# Patient Record
Sex: Male | Born: 1950
Health system: Southern US, Community
[De-identification: ages and names within clinical notes are randomized; demographics above are authoritative.]

## PROBLEM LIST (undated history)

## (undated) DIAGNOSIS — I499 Cardiac arrhythmia, unspecified: Secondary | ICD-10-CM

## (undated) DIAGNOSIS — I5020 Unspecified systolic (congestive) heart failure: Secondary | ICD-10-CM

## (undated) DIAGNOSIS — I77819 Aortic ectasia, unspecified site: Secondary | ICD-10-CM

## (undated) DIAGNOSIS — Z95818 Presence of other cardiac implants and grafts: Secondary | ICD-10-CM

## (undated) DIAGNOSIS — I4819 Other persistent atrial fibrillation: Secondary | ICD-10-CM

## (undated) DIAGNOSIS — I77810 Thoracic aortic ectasia: Secondary | ICD-10-CM

## (undated) DIAGNOSIS — I5032 Chronic diastolic (congestive) heart failure: Secondary | ICD-10-CM

## (undated) DIAGNOSIS — B1920 Unspecified viral hepatitis C without hepatic coma: Secondary | ICD-10-CM

## (undated) DIAGNOSIS — R911 Solitary pulmonary nodule: Secondary | ICD-10-CM

## (undated) DIAGNOSIS — I428 Other cardiomyopathies: Secondary | ICD-10-CM

## (undated) DIAGNOSIS — I2510 Atherosclerotic heart disease of native coronary artery without angina pectoris: Secondary | ICD-10-CM

## (undated) DIAGNOSIS — I10 Essential (primary) hypertension: Secondary | ICD-10-CM

## (undated) DIAGNOSIS — M1990 Unspecified osteoarthritis, unspecified site: Secondary | ICD-10-CM

## (undated) DIAGNOSIS — I429 Cardiomyopathy, unspecified: Secondary | ICD-10-CM

## (undated) DIAGNOSIS — K219 Gastro-esophageal reflux disease without esophagitis: Secondary | ICD-10-CM

## (undated) HISTORY — DX: Gastro-esophageal reflux disease without esophagitis: K21.9

## (undated) HISTORY — DX: Other cardiomyopathies: I42.8

## (undated) HISTORY — DX: Other persistent atrial fibrillation: I48.19

## (undated) HISTORY — DX: Aortic ectasia, unspecified site: I77.819

## (undated) HISTORY — DX: Essential (primary) hypertension: I10

## (undated) HISTORY — DX: Cardiomyopathy, unspecified: I42.9

## (undated) HISTORY — DX: Atherosclerotic heart disease of native coronary artery without angina pectoris: I25.10

## (undated) HISTORY — PX: LUMBAR LAMINECTOMY: SHX95

## (undated) HISTORY — DX: Unspecified systolic (congestive) heart failure: I50.20

## (undated) HISTORY — PX: LIVER BIOPSY: SHX301

## (undated) HISTORY — DX: Chronic diastolic (congestive) heart failure: I50.32

## (undated) HISTORY — DX: Unspecified viral hepatitis C without hepatic coma: B19.20

## (undated) HISTORY — PX: APPENDECTOMY: SHX54

## (undated) HISTORY — DX: Thoracic aortic ectasia: I77.810

## (undated) HISTORY — DX: Solitary pulmonary nodule: R91.1

## (undated) HISTORY — DX: Presence of other cardiac implants and grafts: Z95.818

## (undated) SURGICAL SUPPLY — 18 items
BLANKET WARM UNDERBOD FULL ACC (MISCELLANEOUS) ×1 IMPLANT
CATH DIAG 6FR PIGTAIL ANGLED (CATHETERS) IMPLANT
CATH WATCHMAN STEER ACCESS SYS (CATHETERS) IMPLANT
CLOSURE PERCLOSE PROSTYLE (VASCULAR PRODUCTS) IMPLANT
DEVICE WATCHMAN FLX PRO PROC (KITS) IMPLANT
DEVICE WATCHMAN TRUSTEER PROC (KITS) IMPLANT
DILATOR VESSEL 38 20CM 12FR (INTRODUCER) IMPLANT
KIT VERSACROSS CON 12FR 85 (KITS) IMPLANT
PACK CARDIAC CATHETERIZATION (CUSTOM PROCEDURE TRAY) ×1 IMPLANT
PAD DEFIB RADIO PHYSIO CONN (PAD) ×1 IMPLANT
SHEATH PERFORMER 18FRX30 (VASCULAR PRODUCTS) IMPLANT
SHEATH PINNACLE 8F 10CM (SHEATH) IMPLANT
SHEATH PROBE COVER 6X72 (BAG) ×1 IMPLANT
SYR CONTROL 10ML ANGIOGRAPHIC (SYRINGE) IMPLANT
TRANSDUCER W/STOPCOCK (MISCELLANEOUS) ×1 IMPLANT
WATCHMAN FLX PRO 31 (Prosthesis & Implant Heart) IMPLANT
WATCHMAN FLX PRO PROCEDURE (KITS) ×1 IMPLANT
WATCHMAN TRUSTEER PROCEDURE (KITS) ×1 IMPLANT

---

## 2000-07-13 ENCOUNTER — Ambulatory Visit (HOSPITAL_COMMUNITY): Admission: RE | Admit: 2000-07-13 | Discharge: 2000-07-13 | Payer: Self-pay | Admitting: Gastroenterology

## 2002-08-30 ENCOUNTER — Encounter: Payer: Self-pay | Admitting: Internal Medicine

## 2003-04-03 ENCOUNTER — Ambulatory Visit (HOSPITAL_COMMUNITY): Admission: RE | Admit: 2003-04-03 | Discharge: 2003-04-03 | Payer: Self-pay | Admitting: *Deleted

## 2004-10-18 ENCOUNTER — Ambulatory Visit: Payer: Self-pay | Admitting: Internal Medicine

## 2004-11-13 ENCOUNTER — Ambulatory Visit: Payer: Self-pay | Admitting: Internal Medicine

## 2005-09-08 ENCOUNTER — Ambulatory Visit: Payer: Self-pay | Admitting: Internal Medicine

## 2005-11-28 ENCOUNTER — Ambulatory Visit: Payer: Self-pay | Admitting: Internal Medicine

## 2006-02-21 ENCOUNTER — Emergency Department: Payer: Self-pay | Admitting: Emergency Medicine

## 2006-02-21 ENCOUNTER — Other Ambulatory Visit: Payer: Self-pay

## 2006-02-22 ENCOUNTER — Ambulatory Visit: Payer: Self-pay | Admitting: Emergency Medicine

## 2006-06-04 ENCOUNTER — Ambulatory Visit: Payer: Self-pay | Admitting: Internal Medicine

## 2006-07-07 ENCOUNTER — Ambulatory Visit: Payer: Self-pay | Admitting: Internal Medicine

## 2008-02-24 ENCOUNTER — Ambulatory Visit: Payer: Self-pay | Admitting: Internal Medicine

## 2008-02-24 DIAGNOSIS — K7460 Unspecified cirrhosis of liver: Secondary | ICD-10-CM | POA: Insufficient documentation

## 2008-02-24 DIAGNOSIS — K219 Gastro-esophageal reflux disease without esophagitis: Secondary | ICD-10-CM | POA: Insufficient documentation

## 2008-02-25 ENCOUNTER — Ambulatory Visit: Payer: Self-pay | Admitting: Internal Medicine

## 2008-02-29 LAB — CONVERTED CEMR LAB
ALT: 57 units/L — ABNORMAL HIGH (ref 0–53)
AST: 53 units/L — ABNORMAL HIGH (ref 0–37)
Albumin: 4 g/dL (ref 3.5–5.2)
Alkaline Phosphatase: 62 units/L (ref 39–117)
BUN: 16 mg/dL (ref 6–23)
Basophils Absolute: 0 10*3/uL (ref 0.0–0.1)
Basophils Relative: 0.2 % (ref 0.0–3.0)
Bilirubin, Direct: 0.2 mg/dL (ref 0.0–0.3)
CO2: 31 meq/L (ref 19–32)
Calcium: 9.4 mg/dL (ref 8.4–10.5)
Chloride: 109 meq/L (ref 96–112)
Creatinine, Ser: 0.8 mg/dL (ref 0.4–1.5)
Eosinophils Absolute: 0.4 10*3/uL (ref 0.0–0.7)
Eosinophils Relative: 4.6 % (ref 0.0–5.0)
GFR calc Af Amer: 128 mL/min
GFR calc non Af Amer: 106 mL/min
Glucose, Bld: 89 mg/dL (ref 70–99)
HCT: 42.7 % (ref 39.0–52.0)
Hemoglobin: 14.6 g/dL (ref 13.0–17.0)
Lymphocytes Relative: 34.5 % (ref 12.0–46.0)
MCHC: 34.3 g/dL (ref 30.0–36.0)
MCV: 96.1 fL (ref 78.0–100.0)
Monocytes Absolute: 1 10*3/uL (ref 0.1–1.0)
Monocytes Relative: 11.2 % (ref 3.0–12.0)
Neutro Abs: 4.2 10*3/uL (ref 1.4–7.7)
Neutrophils Relative %: 49.5 % (ref 43.0–77.0)
PSA: 0.31 ng/mL (ref 0.10–4.00)
Phosphorus: 3.5 mg/dL (ref 2.3–4.6)
Platelets: 209 10*3/uL (ref 150–400)
Potassium: 4.6 meq/L (ref 3.5–5.1)
RBC: 4.44 M/uL (ref 4.22–5.81)
RDW: 13.2 % (ref 11.5–14.6)
Sodium: 143 meq/L (ref 135–145)
Total Bilirubin: 1 mg/dL (ref 0.3–1.2)
Total Protein: 7.5 g/dL (ref 6.0–8.3)
WBC: 8.6 10*3/uL (ref 4.5–10.5)

## 2008-05-12 ENCOUNTER — Emergency Department: Payer: Self-pay

## 2008-05-23 ENCOUNTER — Ambulatory Visit: Payer: Self-pay | Admitting: Family Medicine

## 2008-07-26 ENCOUNTER — Ambulatory Visit: Payer: Self-pay | Admitting: Internal Medicine

## 2009-02-20 ENCOUNTER — Ambulatory Visit: Payer: Self-pay | Admitting: Internal Medicine

## 2009-02-20 DIAGNOSIS — L259 Unspecified contact dermatitis, unspecified cause: Secondary | ICD-10-CM | POA: Insufficient documentation

## 2009-03-08 ENCOUNTER — Ambulatory Visit: Payer: Self-pay | Admitting: Internal Medicine

## 2009-03-08 LAB — CONVERTED CEMR LAB
Cholesterol, target level: 200 mg/dL
HDL goal, serum: 40 mg/dL
LDL Goal: 130 mg/dL

## 2009-03-09 LAB — CONVERTED CEMR LAB
ALT: 50 units/L (ref 0–53)
AST: 54 units/L — ABNORMAL HIGH (ref 0–37)
Albumin: 4.1 g/dL (ref 3.5–5.2)
Alkaline Phosphatase: 64 units/L (ref 39–117)
BUN: 14 mg/dL (ref 6–23)
Basophils Absolute: 0 10*3/uL (ref 0.0–0.1)
Basophils Relative: 0.3 % (ref 0.0–3.0)
Bilirubin, Direct: 0.1 mg/dL (ref 0.0–0.3)
CO2: 31 meq/L (ref 19–32)
Calcium: 9.2 mg/dL (ref 8.4–10.5)
Chloride: 106 meq/L (ref 96–112)
Creatinine, Ser: 0.9 mg/dL (ref 0.4–1.5)
Eosinophils Absolute: 0.4 10*3/uL (ref 0.0–0.7)
Eosinophils Relative: 4.7 % (ref 0.0–5.0)
GFR calc non Af Amer: 91.99 mL/min (ref >60)
Glucose, Bld: 77 mg/dL (ref 70–99)
HCT: 43 % (ref 39.0–52.0)
Hemoglobin: 14.5 g/dL (ref 13.0–17.0)
INR: 1 (ref 0.8–1.0)
Lymphocytes Relative: 29.4 % (ref 12.0–46.0)
Lymphs Abs: 2.8 10*3/uL (ref 0.7–4.0)
MCHC: 33.7 g/dL (ref 30.0–36.0)
MCV: 95.7 fL (ref 78.0–100.0)
Monocytes Absolute: 0.9 10*3/uL (ref 0.1–1.0)
Monocytes Relative: 9.8 % (ref 3.0–12.0)
Neutro Abs: 5.4 10*3/uL (ref 1.4–7.7)
Neutrophils Relative %: 55.8 % (ref 43.0–77.0)
PSA: 0.27 ng/mL (ref 0.10–4.00)
Platelets: 188 10*3/uL (ref 150.0–400.0)
Potassium: 3.9 meq/L (ref 3.5–5.1)
Prothrombin Time: 10.9 s (ref 9.1–11.7)
RBC: 4.5 M/uL (ref 4.22–5.81)
RDW: 13.5 % (ref 11.5–14.6)
Sodium: 142 meq/L (ref 135–145)
TSH: 0.78 microintl units/mL (ref 0.35–5.50)
Total Bilirubin: 0.6 mg/dL (ref 0.3–1.2)
Total Protein: 7.7 g/dL (ref 6.0–8.3)
WBC: 9.5 10*3/uL (ref 4.5–10.5)

## 2009-03-20 ENCOUNTER — Telehealth: Payer: Self-pay | Admitting: Internal Medicine

## 2009-03-28 ENCOUNTER — Encounter: Payer: Self-pay | Admitting: Internal Medicine

## 2009-08-14 ENCOUNTER — Ambulatory Visit: Payer: Self-pay | Admitting: Family Medicine

## 2009-08-15 LAB — CONVERTED CEMR LAB
ALT: 53 units/L (ref 0–53)
AST: 46 units/L — ABNORMAL HIGH (ref 0–37)
Albumin: 4.1 g/dL (ref 3.5–5.2)
Alkaline Phosphatase: 59 units/L (ref 39–117)
BUN: 14 mg/dL (ref 6–23)
Bilirubin, Direct: 0.1 mg/dL (ref 0.0–0.3)
CO2: 30 meq/L (ref 19–32)
Calcium: 9.2 mg/dL (ref 8.4–10.5)
Chloride: 106 meq/L (ref 96–112)
Creatinine, Ser: 0.9 mg/dL (ref 0.4–1.5)
GFR calc non Af Amer: 91.85 mL/min (ref >60)
Glucose, Bld: 129 mg/dL — ABNORMAL HIGH (ref 70–99)
Potassium: 4.3 meq/L (ref 3.5–5.1)
Sodium: 140 meq/L (ref 135–145)
Total Bilirubin: 0.4 mg/dL (ref 0.3–1.2)
Total Protein: 7.8 g/dL (ref 6.0–8.3)

## 2009-08-27 ENCOUNTER — Telehealth: Payer: Self-pay | Admitting: Family Medicine

## 2009-09-03 ENCOUNTER — Ambulatory Visit: Payer: Self-pay | Admitting: Internal Medicine

## 2009-09-03 DIAGNOSIS — I10 Essential (primary) hypertension: Secondary | ICD-10-CM | POA: Insufficient documentation

## 2009-09-07 ENCOUNTER — Telehealth: Payer: Self-pay | Admitting: Internal Medicine

## 2009-09-10 ENCOUNTER — Ambulatory Visit: Payer: Self-pay | Admitting: Internal Medicine

## 2009-11-20 ENCOUNTER — Encounter: Admission: RE | Admit: 2009-11-20 | Discharge: 2009-11-20 | Payer: Self-pay | Admitting: Internal Medicine

## 2009-11-20 ENCOUNTER — Telehealth: Payer: Self-pay | Admitting: Internal Medicine

## 2010-03-18 ENCOUNTER — Telehealth: Payer: Self-pay | Admitting: Internal Medicine

## 2010-05-08 ENCOUNTER — Encounter: Payer: Self-pay | Admitting: Internal Medicine

## 2010-05-08 ENCOUNTER — Ambulatory Visit: Payer: Self-pay | Admitting: Internal Medicine

## 2010-05-13 ENCOUNTER — Ambulatory Visit: Payer: Self-pay | Admitting: Internal Medicine

## 2010-05-13 ENCOUNTER — Telehealth (INDEPENDENT_AMBULATORY_CARE_PROVIDER_SITE_OTHER): Payer: Self-pay | Admitting: *Deleted

## 2010-05-13 ENCOUNTER — Encounter (INDEPENDENT_AMBULATORY_CARE_PROVIDER_SITE_OTHER): Payer: Self-pay | Admitting: *Deleted

## 2010-05-13 LAB — CONVERTED CEMR LAB
ALT: 44 units/L (ref 0–53)
AST: 42 units/L — ABNORMAL HIGH (ref 0–37)
Albumin: 4.3 g/dL (ref 3.5–5.2)
Alkaline Phosphatase: 59 units/L (ref 39–117)
BUN: 21 mg/dL (ref 6–23)
Basophils Absolute: 0.1 10*3/uL (ref 0.0–0.1)
Basophils Relative: 0.5 % (ref 0.0–3.0)
Bilirubin, Direct: 0.1 mg/dL (ref 0.0–0.3)
CO2: 29 meq/L (ref 19–32)
Calcium: 9.6 mg/dL (ref 8.4–10.5)
Chloride: 104 meq/L (ref 96–112)
Creatinine, Ser: 1 mg/dL (ref 0.4–1.5)
Eosinophils Absolute: 0.3 10*3/uL (ref 0.0–0.7)
Eosinophils Relative: 2.5 % (ref 0.0–5.0)
GFR calc non Af Amer: 85.04 mL/min (ref >60)
Glucose, Bld: 93 mg/dL (ref 70–99)
HCT: 43.8 % (ref 39.0–52.0)
Hemoglobin: 14.7 g/dL (ref 13.0–17.0)
Lymphocytes Relative: 29.1 % (ref 12.0–46.0)
Lymphs Abs: 3.3 10*3/uL (ref 0.7–4.0)
MCHC: 33.5 g/dL (ref 30.0–36.0)
MCV: 97.8 fL (ref 78.0–100.0)
Monocytes Absolute: 1 10*3/uL (ref 0.1–1.0)
Monocytes Relative: 8.5 % (ref 3.0–12.0)
Neutro Abs: 6.8 10*3/uL (ref 1.4–7.7)
Neutrophils Relative %: 59.4 % (ref 43.0–77.0)
PSA: 0.27 ng/mL (ref 0.10–4.00)
Phosphorus: 3.4 mg/dL (ref 2.3–4.6)
Platelets: 217 10*3/uL (ref 150.0–400.0)
Potassium: 5.1 meq/L (ref 3.5–5.1)
RBC: 4.48 M/uL (ref 4.22–5.81)
RDW: 14.1 % (ref 11.5–14.6)
Sodium: 141 meq/L (ref 135–145)
TSH: 0.71 microintl units/mL (ref 0.35–5.50)
Total Bilirubin: 0.6 mg/dL (ref 0.3–1.2)
Total Protein: 7.4 g/dL (ref 6.0–8.3)
WBC: 11.4 10*3/uL — ABNORMAL HIGH (ref 4.5–10.5)

## 2010-05-21 LAB — CONVERTED CEMR LAB
HCV Ab: REACTIVE — AB
HCV Quantitative: 5120000 intl units/mL — ABNORMAL HIGH (ref <43)

## 2010-05-27 ENCOUNTER — Encounter: Payer: Self-pay | Admitting: Internal Medicine

## 2010-07-25 NOTE — Assessment & Plan Note (Signed)
Summary: BLOOD PRESSURE/DS   Vital Signs:  Patient profile:   60 year old male Weight:      202 pounds Temp:     98.8 degrees F oral Pulse rate:   60 / minute Pulse rhythm:   regular BP sitting:   156 / 70  (left arm) Cuff size:   large  Vitals Entered By: Mervin Hack CMA Duncan Dull) (September 03, 2009 10:01 AM)  Serial Vital Signs/Assessments:  Time      Position  BP       Pulse  Resp  Temp     By           R Arm     146/84                         [name] MD  CC: blood pressure   History of Present Illness: BP has remained high Did note some tingling in left arm several days ago--lasted about a day starting at night and then easing through the next day  Slight neck stiffness and some slight head discomfort  Seen by Dr Dayton Martes  ~2 weeks ago--then went to urgent care after that for possible "flu" Blood count was high and put on augmentin. Did have 2 nights of drenching night sweats  Home BP taking morning and evening 160/84, 134/80, 144/84, 150/76, 156/80 Had been over 160 and occ 170 systolic before doubling lisinopril  No throat symptoms except rare funny feeling Quit smoking and using nicotine gum No sig cough   Allergies: No Known Drug Allergies  Past History:  Past medical, surgical, family and social histories (including risk factors) reviewed for relevance to current acute and chronic problems.  Past Medical History: Hepatitis C--Rx interferon/ribaviron 2004-'05 (recurred after Rx) GERD Hypertension  Past Surgical History: Reviewed history from 02/24/2008 and no changes required. Appendectomy Disk repair  ~1990  Family History: Reviewed history from 03/08/2009 and no changes required. Dad committed suicide Mom died of DM complications 1 brother 1 half brother, 3 half sisters Half sister with breast cancer ?CAD in distant maternal relatives No HTN No colon cancer or prostate cancer  Social History: Reviewed history from 08/14/2009 and no  changes required. Occupation: Games developer, has farm Married--1 child, 2 step children Quit smoking December 2010 Alcohol use-occ beer  Review of Systems  The patient denies chest pain, syncope, and dyspnea on exertion.         had slight dizziness 1 day still with some rib soreness Occ indigestion  Physical Exam  General:  alert and normal appearance.   Neck:  supple, no masses, no thyromegaly, no carotid bruits, and no cervical lymphadenopathy.   Lungs:  normal respiratory effort and normal breath sounds.   Heart:  normal rate, regular rhythm, no murmur, and no gallop.   Extremities:  no edema Psych:  normally interactive, good eye contact, not anxious appearing, and not depressed appearing.     Impression & Recommendations:  Problem # 1:  HYPERTENSION (ICD-401.9) Assessment Improved discussed choices Much better but will add HCTZ recheck with labs in 4-6 weeks  The following medications were removed from the medication list:    Lisinopril 10 Mg Tabs (Lisinopril) .Marland Kitchen... Take 2 tab by mouth daily His updated medication list for this problem includes:    Lisinopril-hydrochlorothiazide 20-12.5 Mg Tabs (Lisinopril-hydrochlorothiazide) .Marland Kitchen... 1 tab daily for high blood pressure  BP today: 156/70 Prior BP: 188/90 (08/14/2009)  Prior 10 Yr Risk Heart  Disease: Not enough information (03/08/2009)  Labs Reviewed: K+: 4.3 (08/14/2009) Creat: : 0.9 (08/14/2009)     Problem # 2:  HEPATITIS C (ICD-070.51) Assessment: Comment Only has been considering repeat biopsy not clear cut reassuring that LFTs are near normal  Complete Medication List: 1)  Omeprazole 20 Mg Cpdr (Omeprazole) .... Take 1 capsule by mouth once a day as needed 2)  Ibuprofen 200 Mg Caps (Ibuprofen) .... Take one by mouth as needed 3)  Lisinopril-hydrochlorothiazide 20-12.5 Mg Tabs (Lisinopril-hydrochlorothiazide) .Marland Kitchen.. 1 tab daily for high blood pressure  Patient Instructions: 1)  Please schedule  a follow-up appointment in 4-6 weeks.  Prescriptions: LISINOPRIL-HYDROCHLOROTHIAZIDE 20-12.5 MG TABS (LISINOPRIL-HYDROCHLOROTHIAZIDE) 1 tab daily for high blood pressure  #90 x 3   Entered and Authorized by:   [name] MD   Signed by:   [name] MD on 09/03/2009   Method used:   Electronically to        [facility] (retail)       [address]       [place], [place]  [id number]       Ph: [phone]       Fax: [phone]   RxID:   [id number]   Current Allergies (reviewed today): No known allergies

## 2010-07-25 NOTE — Letter (Signed)
Summary: Notification of [facility]  Notification of [facility]   Imported By: Maryln Gottron 06/03/2010 13:16:51  _____________________________________________________________________  External Attachment:    Type:   Image     Comment:   External Document  Appended Document: Notification of [facility] has appt 07/24/10 with Dr Foy [place] for hepatitis c

## 2010-07-25 NOTE — Progress Notes (Signed)
Summary: Blood pressure  Phone Note Call from Patient Call back at Home Phone [phone]   Caller: Spouse Call For: [name] MD Summary of Call: Blood pressure readings since last week: 173/88, 179/93, 127/66, 160/85, this morning 166/99.  Patient is on Lisinopril.  Patient wants to know if he should make appt with [name] to follow up about blood pressure.   Initial call taken by: Linde Gillis CMA Duncan Dull),  August 27, 2009 9:30 AM  Follow-up for Phone Call        go ahead and increase his lisinopril to 20 (that is 2 of the 10 mg) once daily  sched f/u with Dr [name] please update [place] if new symptoms or worse bp  Follow-up by: Judith Part MD,  August 27, 2009 9:35 AM  Additional Follow-up for Phone Call Additional follow up Details #1::        spoke with pt and advised results, pt is also taking Augmentin 875 two times a day, would that have an effect on his BP? I scheduled pt with [name] on 09/03/2009 @ 10:00am. Please advise DeShannon Katrinka Blazing CMA (AAMA)  August 27, 2009 1:48 PM   I do not think that would affect bp but otc meds may- so keep me aware ---MT  Pt states he is not taking any otc meds.  Lowella Petties CMA  August 27, 2009 3:35 PM   Additional Follow-up by: Judith Part MD,  August 27, 2009 2:35 PM

## 2010-07-25 NOTE — Assessment & Plan Note (Signed)
Summary: check pulse, BP/ds   Vital Signs:  Patient profile:   60 year old male Weight:      199 pounds Temp:     98.8 degrees F oral Pulse rate:   92 / minute Pulse rhythm:   regular BP sitting:   112 / 68  (left arm) Cuff size:   large  Vitals Entered By: Mervin Hack CMA Duncan Dull) (September 10, 2009 4:38 PM)  Serial Vital Signs/Assessments:  Time      Position  BP       Pulse  Resp  Temp     By           R Arm     124/76                         [name] MD  CC: discuss blood pressure and pulse   History of Present Illness: See phone note Had elevated heart rate but no symptoms  No chest pain No SOB No dizziness No syncope Mild decrease in exercise tolerance---slowly over years 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  Past Medical History: Reviewed history from 09/03/2009 and no changes required. Hepatitis C--Rx interferon/ribaviron 2004-'05 (recurred after Rx) GERD Hypertension  Past Surgical History: Reviewed history from 02/24/2008 and no changes required. Appendectomy Disk repair  ~1990  Family History: Reviewed history from 03/08/2009 and no changes required. Dad committed suicide Mom died of DM complications 1 brother 1 half brother, 3 half sisters Half sister with breast cancer ?CAD in distant maternal relatives No HTN No colon cancer or prostate cancer  Social History: Reviewed history from 08/14/2009 and no changes required. 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   Neck:  supple, no masses, no thyromegaly, no carotid bruits, and no cervical lymphadenopathy.   Chest Wall:  no deformities and no tenderness.   Lungs:  normal respiratory effort and normal breath sounds.   Heart:  normal rate, regular rhythm, no murmur, no gallop, and no rub.   Extremities:  no edema Psych:  normally interactive, good eye contact, not anxious appearing, and not depressed appearing.     Impression & Recommendations:  Problem # 1:  TACHYCARDIA (ICD-785.0) Assessment New  sounds appropriate to the situation discussed concerning symptoms--to call 911---but nothing he states is concerning now mild change in exercise tolerance but nothing striking  no changes now if he has symptoms though, could consider changing to beta blocker will just reevaluate at his follow up  Orders: EKG w/ Interpretation (93000)  Complete Medication List: 1)  Omeprazole 20 Mg Cpdr (Omeprazole) .... Take 1 capsule by mouth once a day as needed 2)  Ibuprofen 200 Mg Caps (Ibuprofen) .... Take one  by mouth as needed 3)  Lisinopril-hydrochlorothiazide 20-12.5 Mg Tabs (Lisinopril-hydrochlorothiazide) .Marland Kitchen.. 1 tab daily for high blood pressure  Patient Instructions: 1)  Cancel April visit 2)  Please schedule a follow-up appointment in 4 months .   Current Allergies (reviewed today): No known allergies    EKG  Procedure date:  09/10/2009  Findings:      sinus arrhythmia @75  Normal

## 2010-07-25 NOTE — Assessment & Plan Note (Signed)
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  Past Medical History: Reviewed history from 09/03/2009 and no changes required. Hepatitis C--Rx interferon/ribaviron 2004-'05 (recurred after Rx) GERD Hypertension  Past Surgical History: Reviewed history from 02/24/2008 and no changes required. Appendectomy Disk repair  ~1990  Family History: Reviewed history from 03/08/2009 and no changes required. Dad committed suicide Mom died of DM complications 1 brother 1 half brother, 3 half sisters Half sister with breast cancer ?CAD in distant maternal relatives No HTN No colon cancer or prostate cancer  Social History: Occupation: Games developer, has farm Married--1 child, 2 step children Quit smoking December 2010. Restarted. Alcohol use-occ beer Packs/Day:  0.5  Review of Systems General:  Denies sleep  disorder; weight is stable wears seat belt. Eyes:  Denies double vision and vision loss-1 eye. ENT:  Complains of ringing in ears; denies decreased hearing; rare brief tinnitus Teeth okay--sees dentist. CV:  Denies chest pain or discomfort, difficulty breathing at night, difficulty breathing while lying down, fainting, near fainting, palpitations, and shortness of breath with exertion; has had some rare skip beats Stil feels something in his right ribs. Resp:  Denies cough and shortness of breath. GI:  Denies abdominal pain, bloody stools, change in bowel habits, dark tarry stools, indigestion, nausea, and vomiting; stomach quiet on omeprazole. GU:  Denies erectile dysfunction, urinary frequency, and urinary hesitancy. MS:  Complains of low back pain; denies joint pain and joint swelling; occ back pain--relates to past surgery. Derm:  Complains of lesion(s); denies rash; has a couple of moles on legs and spot on left arm. Neuro:  Denies headaches, numbness, tingling, and weakness. Psych:  Denies anxiety and depression. Heme:  Denies abnormal bruising and enlarge lymph nodes. Allergy:  Denies seasonal allergies and sneezing.  Physical Exam  General:  alert and normal appearance.   Eyes:  pupils equal, pupils round, pupils reactive to light, and no optic disk abnormalities.   Ears:  R ear normal and L ear normal.   Mouth:  no erythema, no exudates, and no lesions.   Neck:  supple, no masses, no thyromegaly, no carotid bruits, and no cervical lymphadenopathy.   Lungs:  normal respiratory effort, no intercostal retractions, no accessory muscle use, and normal breath sounds.   Heart:  normal rate, regular  rhythm, no murmur, and no gallop.   Abdomen:  soft, non-tender, no masses, no hepatomegaly, and no splenomegaly.   Rectal:  no hemorrhoids and no masses.   Prostate:  no nodules.  MIld asymmetry with right slightly larger Msk:  no joint tenderness and no joint swelling.   Pulses:  1+ in  feet Extremities:  no edeema Neurologic:  alert & oriented X3, strength normal in all extremities, and gait normal.   Skin:  no rashes and no ulcerations.   5mm nevus on left thigh, also seb keratosis Axillary Nodes:  No palpable lymphadenopathy Psych:  normally interactive, good eye contact, not anxious appearing, and not depressed appearing.     Impression & Recommendations:  Problem # 1:  PHYSICAL EXAMINATION (ICD-V70.0) Assessment Comment Only due for PSA discussed cigarette cessation work on fitness  Problem # 2:  HYPERTENSION (ICD-401.9) Assessment: Unchanged still seems to need low dose Rx  His updated medication list for this problem includes:    Lisinopril-hydrochlorothiazide 20-12.5 Mg Tabs (Lisinopril-hydrochlorothiazide) .Marland Kitchen... 1/2  tab daily for high blood pressure  BP today: 140/80 Prior BP: 112/68 (09/10/2009)  Prior 10 Yr Risk Heart Disease: Not enough information (03/08/2009)  Labs Reviewed: K+: 4.3 (08/14/2009) Creat: : 0.9 (08/14/2009)     Problem # 3:  PALPITATIONS (ICD-785.1) Assessment: New  sounds benign EKG reassuring  Orders: EKG w/ Interpretation (93000) TLB-Renal Function Panel (80069-RENAL) TLB-CBC Platelet - w/Differential (85025-CBCD) TLB-Hepatic/Liver Function Pnl (80076-HEPATIC) TLB-TSH (Thyroid Stimulating Hormone) (84443-TSH) Venipuncture (04540)  Problem # 4:  HEPATITIS C (ICD-070.51) Assessment: Comment Only  will set up reeval at [facility]  Orders: Hepatitis C Clinic Referral (HepC)  Complete Medication List: 1)  Omeprazole 20 Mg Cpdr (Omeprazole) .... Take 1 capsule by mouth once a day as needed 2)  Ibuprofen 200 Mg Caps (Ibuprofen) .... Take one by mouth as needed 3)  Lisinopril-hydrochlorothiazide 20-12.5 Mg Tabs (Lisinopril-hydrochlorothiazide) .... 1/2  tab daily for high blood pressure  Other Orders: TLB-PSA (Prostate Specific Antigen) (84153-PSA)  Patient Instructions: 1)  Please schedule a follow-up appointment in 6  months .  2)  Referral Appointment Information 3)  Day/Date: 4)  Time: 5)  Place/MD: 6)  Address: 7)  Phone/Fax: 8)  Patient given appointment information. Information/Orders faxed/mailed.   Orders Added: 1)  Hepatitis C Clinic Referral [HepC] 2)  EKG w/ Interpretation [93000] 3)  Est. Patient 40-64 years [99396] 4)  TLB-Renal Function Panel [80069-RENAL] 5)  TLB-CBC Platelet - w/Differential [85025-CBCD] 6)  TLB-Hepatic/Liver Function Pnl [80076-HEPATIC] 7)  TLB-TSH (Thyroid Stimulating Hormone) [84443-TSH] 8)  Venipuncture [98119] 9)  TLB-PSA (Prostate Specific Antigen) [14782-NFA]    Current Allergies (reviewed today): No known allergies   Appended Document: CPX    Clinical Lists Changes  Orders: Added new Service order of Admin 1st Vaccine (21308) - Signed Added new Service order of Flu Vaccine 83yrs + [phone]) - Signed Observations: Added new observation of FLU VAX VIS: 01/15/10 version (05/08/2010 13:20) Added new observation of FLU VAXLOT: [id number] (05/08/2010 13:20) Added new observation of FLU VAXMFR: [employer]os[employer]line (05/08/2010 13:20) Added new observation of FLU VAX EXP: 12/21/2010 (05/08/2010 13:20) Added new observation of FLU VAX DSE: 0.74ml (05/08/2010 13:20) Added new observation of FLU VAX: Fluvax 3+ (05/08/2010 13:20)  Flu Vaccine Consent Questions     Do you have a history of severe allergic reactions to this vaccine? no    Any prior history of allergic reactions to egg and/or gelatin? no    Do you have a sensitivity to the preservative Thimersol?  no    Do you have a past history of Guillan-Barre Syndrome? no    Do you currently have an acute febrile illness? no    Have you ever had a severe reaction to latex? no    Vaccine information given and explained to patient? yes    Are you currently pregnant? no    Lot Number:[id number]   Exp Date:12/21/2010   Site Given  Left Deltoid IM    .[email or web address]

## 2010-07-25 NOTE — Miscellaneous (Signed)
  Clinical Lists Changes  Orders: Added new Test order of T-Hepatitis C Antibody ([id number]) - Signed

## 2010-07-25 NOTE — Assessment & Plan Note (Signed)
Summary: high blood pressure, headaches/ alc   Vital Signs:  Patient profile:   60 year old male Height:      72.25 inches Weight:      210 pounds BMI:     28.39 Temp:     98.7 degrees F oral Pulse rate:   68 / minute Pulse rhythm:   regular BP sitting:   188 / 90  (left arm) Cuff size:   regular  Vitals Entered By: Delilah Shan CMA Duncan Dull) (August 14, 2009 10:38 AM) CC: High BP,  H/A   History of Present Illness: 60 yo here for high blood pressure. Does not have a diagnosis of HTN but appears BP has been slowly increasing over this past year. At prior office visits: 02/2009- 150/80 07/2008- 138/80 02/2008- 140/76  Has been getting headaches lately, was at [employer] last week and happened to check BP was 180/90. Wife has BP cuff at home, has been checking daily - ranging 164/85-171/84. No chest pain, SOB, blurred vision, or lower extremity edema.  Did quit smoking a couple of months ago, chew nicotene gum.    Has h/o Hep C, s/p interferon treatment. AST was mildly elevated in 02/2009, otherwise normal. He is concerned that he is having right lower rib pain.  He does repetitve movements at work using mainly that side so thinks it's muscular but wants to be sure it's not his liver. No changes in his bowel movements, color skin, eyes, nausea or vomiting.  Current Medications (verified): 1)  Omeprazole 20 Mg Cpdr (Omeprazole) .... Take 1 Capsule By Mouth Once A Day As Needed 2)  Ibuprofen 200 Mg Caps (Ibuprofen) .... Take One By Mouth As Needed 3)  Lisinopril 10 Mg  Tabs (Lisinopril) .... Take 1 Tab By Mouth Daily  Allergies (verified): No Known Drug Allergies  Social History: Occupation: Games developer, has farm Married--1 child, 2 step children Quit smoking December 2010 Alcohol use-occ beer  Review of Systems      See HPI General:  Denies malaise. Eyes:  Denies blurring. ENT:  Denies difficulty swallowing and sinus pressure. CV:  Denies difficulty  breathing at night, palpitations, shortness of breath with exertion, swelling of feet, and swelling of hands. Resp:  Denies sputum productive. GI:  Denies abdominal pain, bloody stools, change in bowel habits, dark tarry stools, nausea, vomiting, and yellowish skin color. Neuro:  Complains of headaches; denies falling down, sensation of room spinning, tingling, tremors, visual disturbances, and weakness. Heme:  Denies bleeding, fevers, pallor, and skin discoloration.  Physical Exam  General:  alert and normal appearance.   Eyes:  pupils equal, pupils round, pupils reactive to light, and no optic disk abnormalities.   Mouth:  no erythema and no lesions.   Neck:  supple, no masses, no thyromegaly, no carotid bruits, and no cervical lymphadenopathy.   Lungs:  normal respiratory effort and normal breath sounds.   Heart:  normal rate, regular rhythm, no murmur, and no gallop.   Abdomen:  soft, non-tender, and no masses.  no hepatomegaly.   Msk:  No tenderness over rib cage Extremities:  no edema Psych:  normally interactive, good eye contact, not anxious appearing, and not depressed appearing.     Impression & Recommendations:  Problem # 1:  ESSENTIAL HYPERTENSION, BENIGN (ICD-401.1) Assessment New Will start on Linsinopril.  Patient information handout given and discussed concerning possible side effects, including cough. Will check BMET today.  Cr normal (0.9) in Septmeber.  Advised to keep BP log and  call me in 2 weeks, follow up in office in one month. His updated medication list for this problem includes:    Lisinopril 10 Mg Tabs (Lisinopril) .Marland Kitchen... Take 1 tab by mouth daily  Orders: Venipuncture (16109) TLB-BMP (Basic Metabolic Panel-BMET) (80048-METABOL)  Problem # 2:  HEPATITIS C (ICD-070.51) Assessment: Unchanged Will recheck LFTs due to patient concern.  Unable to reproduce pain he is concerned about.  If LFTs stable, may be worth while getting imaging. Orders: Venipuncture  (60454) TLB-Hepatic/Liver Function Pnl (80076-HEPATIC)  Complete Medication List: 1)  Omeprazole 20 Mg Cpdr (Omeprazole) .... Take 1 capsule by mouth once a day as needed 2)  Ibuprofen 200 Mg Caps (Ibuprofen) .... Take one by mouth as needed 3)  Lisinopril 10 Mg Tabs (Lisinopril) .... Take 1 tab by mouth daily  Patient Instructions: 1)  Nice to meet you, Mr. Bingman. 2)  Please call me in 2 weeks with a report on how your blood pressure has been doing. 3)  Make an appointment with [name] or myself in one month to follow up. Prescriptions: LISINOPRIL 10 MG  TABS (LISINOPRIL) Take 1 tab by mouth daily  #90 x 0   Entered and Authorized by:   Ruthe Mannan MD   Signed by:   Ruthe Mannan MD on 08/14/2009   Method used:   Electronically to        [facility] (retail)       [address]       [place], [place]  [id number]       Ph: [phone]       Fax: [phone]   RxID:   [id number]   Current Allergies (reviewed today): No known allergies

## 2010-07-25 NOTE — Progress Notes (Signed)
Summary: wants to stop lisinopril  Phone Note Call from Patient Call back at [phone]   Caller: Patient Call For: [name] MD Action Taken: Patient advised to call 911 Summary of Call: Pt states his BP has been running good and he is asking if he can stop his lisinopril  and keep an eye on his BP.  He would prefer not to have to take anything, but if he has to he will. Initial call taken by: Lowella Petties CMA,  March 18, 2010 10:50 AM  Follow-up for Phone Call        Please have him cut the tabs in half for several weeks and keep an eye on his BP. If it remains okay, he can try off the med  Have him set up appt for physical in the next 2-3 months or so since he is due and then I can recheck his BP then Follow-up by: [name] MD,  March 18, 2010 1:50 PM  Additional Follow-up for Phone Call Additional follow up Details #1::        Advised pt, physical appt made. Additional Follow-up by: Lowella Petties CMA,  March 18, 2010 2:47 PM

## 2010-07-25 NOTE — Progress Notes (Signed)
----   Converted from flag ---- ---- 05/13/2010 1:27 PM, [name] MD wrote: go ahead and order both----it should be the postive/neg first and only do the viral load if it is positive  ---- 05/13/2010 12:47 PM, Liane Comber CMA (AAMA) wrote: Pt came for redraw today, I want to make sure I his order test correctly. Shirlee Limerick says the office he is being referred to will only accept a  result of positive or neg on lab report. Hep C viral load will give a numerical result. Are you ordering his labs just for that office or did you want to know the actual viral load count? If you don't need the viral load count I can order a hep c test that just gives result of pos or neg. If you do want viral load count I can order both. Let me know which you would prefer  Thanks Tasha ------------------------------

## 2010-07-25 NOTE — Miscellaneous (Signed)
Summary: Orders Update  Clinical Lists Changes  Orders: Added new Test order of T-Hepatitis C Viral Load ([id number]) - Signed Added new Service order of Specimen Handling ([id number]) - Signed

## 2010-07-25 NOTE — Progress Notes (Signed)
Summary: pulse of 121  Phone Note Call from Patient Call back at Home Phone [phone]   Caller: Patient Call For: [name] MD Summary of Call: Patient says that when he check his blood pressure today it was 115/75, but his pulse was 121. He says that he has been working out in the yard and being very active today. He says that he feels fine, he wants to know if he needs to come in and be seen. Please advise.  Initial call taken by: Melody Comas,  September 07, 2009 1:56 PM  Follow-up for Phone Call        no, if he had been working out we would expect his heart rate to go up It should at least come down below 100 with rest. If that doesn't occur and stays up, evaluation is appropriate but I am not sure it needs to be immediate unless he has symptoms like chest pain or SOB [name] MD  September 07, 2009 2:02 PM   spoke with pt and now his heart rate is 102. Pt states he has a friend that see's a cardiologist and he explained his situation to the "cardiologist" and he suggested a EKG. Pt would like to come in for a EKG to see if that will explain his blood pressures? Pt states he has heart problems in his family. I explained to pt his BP could be related to several things, but he wants a EKG or stress test.Please advise. DeShannon Katrinka Blazing CMA [facility])  September 07, 2009 2:40 PM   Please set up an appt for Monday or Tuesday His heart rate and blood pressure were okay at last visit can check EKG and consider evaluation by a cardiologist Follow-up by: [name] MD,  September 07, 2009 2:57 PM  Additional Follow-up for Phone Call Additional follow up Details #1::        pt scheduled appt for Monday at 4:15 Additional Follow-up by: Mervin Hack CMA [facility]),  September 07, 2009 4:14 PM

## 2010-07-25 NOTE — Progress Notes (Signed)
Summary: discomfort around rib area  Phone Note Call from Patient Call back at Home Phone [phone]   Caller: Patient Call For: [name] MD Summary of Call: Pt was seen in march for blood pressure issues but had some pain around right ribs.  He says he was told to call if this continued and you would send him for x-rays.  He still has the discomfort.  He prefers to go somewhere in [place] or [place]. Initial call taken by: Lowella Petties CMA,  Nov 20, 2009 8:30 AM  Follow-up for Phone Call        x-rays ordered Follow-up by: [name] MD,  Nov 20, 2009 8:41 AM  New Problems: RIB PAIN, RIGHT SIDED (ICD-786.50)   New Problems: RIB PAIN, RIGHT SIDED (ICD-786.50)

## 2010-09-22 HISTORY — PX: COLONOSCOPY: SHX174

## 2010-10-04 ENCOUNTER — Other Ambulatory Visit: Payer: Self-pay | Admitting: *Deleted

## 2010-10-04 MED ORDER — OMEPRAZOLE 20 MG PO CPDR: 20.0000 mg | DELAYED_RELEASE_CAPSULE | Freq: Every day | ORAL | Status: DC

## 2010-10-10 ENCOUNTER — Telehealth: Payer: Self-pay | Admitting: *Deleted

## 2010-10-10 DIAGNOSIS — Z1211 Encounter for screening for malignant neoplasm of colon: Secondary | ICD-10-CM

## 2010-10-10 NOTE — Telephone Encounter (Signed)
Pt wants a referral for a colonoscopy, he wants to have this done in [place].  He has been seen Dr. Foy Guadal[place]ara, in [place] [facility] the [facility], who has told him he needs to have this done. That doctor wants pt to have the procedure in [place], but that's inconvenient for the pt.  Pt would like to be referred to Dr. Mechele Collin, or someone in that group.  If possible he would like to have this before 5/1, because that is when his insurance deductible starts over.

## 2010-10-14 ENCOUNTER — Ambulatory Visit (AMBULATORY_SURGERY_CENTER): Payer: 59 | Admitting: *Deleted

## 2010-10-14 VITALS — Ht 74.0 in | Wt 199.5 lb

## 2010-10-14 DIAGNOSIS — Z1211 Encounter for screening for malignant neoplasm of colon: Secondary | ICD-10-CM

## 2010-10-14 MED ORDER — PEG-KCL-NACL-NASULF-NA ASC-C 100 G PO SOLR: ORAL | Status: DC

## 2010-10-15 ENCOUNTER — Encounter: Payer: Self-pay | Admitting: Internal Medicine

## 2010-10-18 ENCOUNTER — Ambulatory Visit (AMBULATORY_SURGERY_CENTER): Payer: 59 | Admitting: Internal Medicine

## 2010-10-18 ENCOUNTER — Encounter: Payer: Self-pay | Admitting: Internal Medicine

## 2010-10-18 VITALS — BP 137/76 | HR 59 | Temp 97.9°F | Resp 18 | Ht 74.0 in | Wt 195.0 lb

## 2010-10-18 DIAGNOSIS — Z1211 Encounter for screening for malignant neoplasm of colon: Secondary | ICD-10-CM

## 2010-10-18 DIAGNOSIS — D126 Benign neoplasm of colon, unspecified: Secondary | ICD-10-CM

## 2010-10-18 DIAGNOSIS — K5730 Diverticulosis of large intestine without perforation or abscess without bleeding: Secondary | ICD-10-CM

## 2010-10-18 DIAGNOSIS — K635 Polyp of colon: Secondary | ICD-10-CM

## 2010-10-18 MED ORDER — SODIUM CHLORIDE 0.9 % IV SOLN: 500.0000 mL | INTRAVENOUS | Status: DC

## 2010-10-18 NOTE — Patient Instructions (Signed)
One polyp removed today from your colon.  Severe diverticulosis also seen.  Informationall handouts given to care partner on polyps, diverticulosis and high fiber diet.  You will receive a letter in 2 - 3 weeks to give pathology results and further colonoscopy recommendations.  Please restart current medications today.  Call with any questions or concerns.

## 2010-10-21 ENCOUNTER — Telehealth: Payer: Self-pay | Admitting: *Deleted

## 2010-10-21 NOTE — Telephone Encounter (Signed)
Follow up Call- Patient questions:  Do you have a fever, pain , or abdominal swelling? no Pain Score  0 *  Have you tolerated food without any problems? yes  Have you been able to return to your normal activities? yes  Do you have any questions about your discharge instructions: Diet   no Medications  no Follow up visit  no  Do you have questions or concerns about your Care? no  Actions: * If pain score is 4 or above: No action needed, pain <4.  Spoke with patient's spouse.

## 2010-11-08 ENCOUNTER — Telehealth: Payer: Self-pay | Admitting: Internal Medicine

## 2010-11-08 NOTE — Procedures (Signed)
[place]. [facility]  Patient:    Craig Calhoun, Craig Calhoun                     MRN: [id number] Proc. Date: 07/13/00 Adm. Date:  [id number] Attending:  Judeth Cornfield                           Procedure Report  PROCEDURE:  Percutaneous liver biopsy.  GASTROENTEROLOGIST:  Barbette Hair. Arlyce Dice, M.D. [facility]  HISTORY:  Mr. Mantione has hepatitis C.  This test is performed for further evaluation.  INFORMED CONSENT:  The patient provided consent after risks, benefits, and alternatives were explained.  MEDICATIONS:  Versed 2 mg, fentanyl 25 mcg IV.  DESCRIPTION OF PROCEDURE:  The patient was placed in the supine position.  The liver was percussed.  The skin was prepped with Betadine, and the skin was injected with 1% lidocaine.  The percutaneous biopsy was made via one pass utilizing the 14-gauge tissue biopsy needle.  A 1 cm liver core was obtained.  The patient tolerated the procedure well. DD:  07/13/00 TD:  07/13/00 Job: [id number]

## 2010-11-08 NOTE — Telephone Encounter (Signed)
Forwarded to Dr. Perry for review °

## 2010-11-12 ENCOUNTER — Encounter: Payer: Self-pay | Admitting: Internal Medicine

## 2010-11-13 ENCOUNTER — Encounter: Payer: Self-pay | Admitting: Internal Medicine

## 2010-11-13 ENCOUNTER — Ambulatory Visit (INDEPENDENT_AMBULATORY_CARE_PROVIDER_SITE_OTHER): Payer: 59 | Admitting: Internal Medicine

## 2010-11-13 VITALS — BP 110/60 | HR 98 | Temp 98.8°F | Ht 72.5 in | Wt 189.0 lb

## 2010-11-13 DIAGNOSIS — I10 Essential (primary) hypertension: Secondary | ICD-10-CM

## 2010-11-13 DIAGNOSIS — IMO0002 Reserved for concepts with insufficient information to code with codable children: Secondary | ICD-10-CM

## 2010-11-13 DIAGNOSIS — K219 Gastro-esophageal reflux disease without esophagitis: Secondary | ICD-10-CM

## 2010-11-13 DIAGNOSIS — M179 Osteoarthritis of knee, unspecified: Secondary | ICD-10-CM

## 2010-11-13 DIAGNOSIS — M1710 Unilateral primary osteoarthritis, unspecified knee: Secondary | ICD-10-CM

## 2010-11-13 DIAGNOSIS — B1710 Acute hepatitis C without hepatic coma: Secondary | ICD-10-CM

## 2010-11-13 NOTE — Progress Notes (Signed)
Subjective:    Patient ID: Craig Calhoun, male    DOB: Aug 21, 1950, 60 y.o.   MRN: [id number]  HPI Doing okay Had colonoscopy with small tubular adenoma  Never got a call back [facility] hepatology Did speak to doctor covering for Dr Jonetta Speak his labs looked okay for now Was waiting for new meds that are better tolerated and more efficacious  Occ checks BP at home Usually 140/70s Remains physically active---may have some decreased tolerance. He relates this to smoking No chest pain No SOB  Has cut back on smoking Had quit for 1 year --doing it cold [place] Now down to 1/2 PPD Discussed quitting with nicotine lozenges prn  Stomach, esophagus fine Remains on omeprazole  Some trouble with right knee Stiff and painful after prolonged sitting---like car trip Okay during exertion in general Occ takes 1 ibuprofen Stiff next day after softball  Current outpatient prescriptions:fish oil-omega-3 fatty acids 1000 MG capsule, Take 2 g by mouth daily.  , Disp: , Rfl: ;  lisinopril-hydrochlorothiazide (PRINZIDE,ZESTORETIC) 20-12.5 MG per tablet, Take 1 tablet by mouth daily.  , Disp: , Rfl: ;  omeprazole (PRILOSEC) 20 MG capsule, Take 1 capsule (20 mg total) by mouth daily., Disp: 30 capsule, Rfl: 6;  DISCONTD: peg 3350 powder (MOVIPREP) 100 G SOLR, MOVIPREP AS DIRECTED, Disp: 1 kit, Rfl: 0 Current facility-administered medications:DISCONTD: 0.9 %  sodium chloride infusion, 500 mL, Intravenous, Continuous, Yancey Flemings, MD  Past Medical History  Diagnosis Date  . Hepatitis C     Rx interferon/ribaviron 2004-'05 (recurred after Rx)  . GERD (gastroesophageal reflux disease)   . Hypertension     Past Surgical History  Procedure Date  . Appendectomy   . Lumbar laminectomy   . Colonoscopy   . Liver biopsy     Family History  Problem Relation Age of Onset  . Colon cancer Maternal Aunt   . Diabetes Mother   . Heart disease Father   . Breast cancer Sister   . Hypertension Neg Hx   .  Cancer Neg Hx     colon or prostate cancer    History   Social History  . Marital Status: Married    Spouse Name: N/A    Number of Children: 3  . Years of Education: N/A   Occupational History  . Games developer, has farm    Social History Main Topics  . Smoking status: Current Everyday Smoker -- 0.5 packs/day    Types: Cigarettes  . Smokeless tobacco: Never Used  . Alcohol Use: 0.6 oz/week    1 Cans of beer per week  . Drug Use: No  . Sexually Active: Not on file   Other Topics Concern  . Not on file   Social History Narrative  . No narrative on file   Review of Systems Bowels okay Sleeps well Appetite fine---weight stable     Objective:   Physical Exam  Constitutional: He appears well-developed and well-nourished. No distress.  Neck: Normal range of motion. Neck supple. No thyromegaly present.  Cardiovascular: Normal rate, regular rhythm and normal heart sounds.  Exam reveals no gallop.   No murmur heard. Pulmonary/Chest: Effort normal and breath sounds normal. No respiratory distress. He has no wheezes. He has no rales.  Abdominal: Soft. He exhibits no mass. There is no tenderness.       No hepatomegaly  Musculoskeletal: Normal range of motion. He exhibits no edema and no tenderness.       Right knee---very slight crepitus. No swelling  No ligament or meniscus findings  Lymphadenopathy:    He has no cervical adenopathy.  Psychiatric: He has a normal mood and affect. His behavior is normal. Judgment and thought content normal.          Assessment & Plan:

## 2010-11-19 ENCOUNTER — Other Ambulatory Visit: Payer: Self-pay | Admitting: *Deleted

## 2010-11-19 MED ORDER — LISINOPRIL-HYDROCHLOROTHIAZIDE 20-12.5 MG PO TABS: 1.0000 | ORAL_TABLET | Freq: Every day | ORAL | Status: DC

## 2011-03-17 ENCOUNTER — Telehealth: Payer: Self-pay | Admitting: *Deleted

## 2011-03-17 NOTE — Telephone Encounter (Signed)
Patient notified as instructed by telephone. Pt said that Dr [name] had explained that the Lisinopril did not effect the kidneys and pt had taken med with no problems and then all of a sudden developed an allergic reaction. Pt also wants to know if Bisoprolol-HCTZ  Also does not effect the kidneys and what side effects should pt look for with this med. The Uvula is better but still swollen and pt wonders if Dr [name] should see him to check the throat and also discuss new BP med or could Dr [name] call pt and discuss over the phone.  Pt can be reached at [phone]. Pt uses [facility] pharmacy abut does not want med called in until he either speaks with or sees Dr [name].

## 2011-03-17 NOTE — Telephone Encounter (Signed)
Have him stop the prinizide (lisinopril). Put this on allergy list as severe---angioedema Have him change to bisoprolol-HCTZ  5/6.25 (#30 x 11) Set up appt in about 1 month to check on his BP Call if any problems with new med

## 2011-03-17 NOTE — Telephone Encounter (Signed)
Patient notified as instructed by telephone. Pt said he was not sick with respiratory infection. Pt woke up 03/16/11 and the uvula was swollen and hurting. Pt will wait to hear from Dr Karle Starch response.

## 2011-03-17 NOTE — Telephone Encounter (Signed)
prinizide can occ cause mouth, lip and face swelling. If he was not sick, and just had swollen uvula, we should change to something else. If he was sick with respiratory infection, and had the swelling, he should be able to continue the med

## 2011-03-17 NOTE — Telephone Encounter (Signed)
[place]. He was given an antibiotic and was given antibiotic and steroid injections.  He was also told to stop his prinzide, the doctor told him that the medicine could be causing his problem.  Please advise on whether pt should stop his medicine.  He hasnt had any since Saturday.  The swelling has gone down some.

## 2011-03-18 NOTE — Telephone Encounter (Signed)
Go ahead and add him tomorrow at 12:15 I will need to discuss this more with him in person If he can't make that, add on Thursday afternoon (I will see 1:30 and 1:45)

## 2011-03-18 NOTE — Telephone Encounter (Signed)
Patient scheduled appt at 12:15

## 2011-03-19 ENCOUNTER — Encounter: Payer: Self-pay | Admitting: Internal Medicine

## 2011-03-19 ENCOUNTER — Ambulatory Visit (INDEPENDENT_AMBULATORY_CARE_PROVIDER_SITE_OTHER): Payer: 59 | Admitting: Internal Medicine

## 2011-03-19 DIAGNOSIS — Z888 Allergy status to other drugs, medicaments and biological substances status: Secondary | ICD-10-CM

## 2011-03-19 DIAGNOSIS — I10 Essential (primary) hypertension: Secondary | ICD-10-CM

## 2011-03-19 DIAGNOSIS — Z23 Encounter for immunization: Secondary | ICD-10-CM

## 2011-03-19 DIAGNOSIS — T7840XA Allergy, unspecified, initial encounter: Secondary | ICD-10-CM | POA: Insufficient documentation

## 2011-03-19 NOTE — Progress Notes (Signed)
  Subjective:    Patient ID: Craig Calhoun, male    DOB: August 08, 1950, 60 y.o.   MRN: [id number]  HPI Had swallowing discomfort over the weekend---could feel the uvula swollen This happened suddenly No swelling of lips Not sick  No fever  Seen in ER They had him stop the prinizide Got steroid shot and antibiotic also  Much better now  No headaches---or at most very early symptoms No sob No chest pain  Current Outpatient Prescriptions on File Prior to Visit  Medication Sig Dispense Refill  . fish oil-omega-3 fatty acids 1000 MG capsule Take 2 g by mouth daily.        Marland Kitchen omeprazole (PRILOSEC) 20 MG capsule Take 1 capsule (20 mg total) by mouth daily.  30 capsule  6    Allergies  Allergen Reactions  . Lisinopril     Uvula swelling---?angioedema    Past Medical History  Diagnosis Date  . Hepatitis C     Rx interferon/ribaviron 2004-'05 (recurred after Rx)  . GERD (gastroesophageal reflux disease)   . Hypertension     Past Surgical History  Procedure Date  . Appendectomy   . Lumbar laminectomy   . Colonoscopy   . Liver biopsy     Family History  Problem Relation Age of Onset  . Colon cancer Maternal Aunt   . Diabetes Mother   . Heart disease Father   . Breast cancer Sister   . Hypertension Neg Hx   . Cancer Neg Hx     colon or prostate cancer    History   Social History  . Marital Status: Married    Spouse Name: N/A    Number of Children: 3  . Years of Education: N/A   Occupational History  . Games developer, has farm    Social History Main Topics  . Smoking status: Current Everyday Smoker -- 0.5 packs/day    Types: Cigarettes  . Smokeless tobacco: Never Used  . Alcohol Use: 0.6 oz/week    1 Cans of beer per week  . Drug Use: No  . Sexually Active: Not on file   Other Topics Concern  . Not on file   Social History Narrative  . No narrative on file   Review of Systems Swallowing okay Appetite is fine     Objective:   Physical  Exam  Constitutional: He appears well-developed and well-nourished. No distress.  HENT:  Mouth/Throat: Oropharynx is clear and moist. No oropharyngeal exudate.       Very slight uvula injection but no swelling  Neck: Normal range of motion. Neck supple. No thyromegaly present.  Cardiovascular: Normal rate, regular rhythm and normal heart sounds.  Exam reveals no gallop.   No murmur heard. Pulmonary/Chest: Effort normal and breath sounds normal. No respiratory distress. He has no wheezes. He has no rales.  Musculoskeletal: He exhibits no edema and no tenderness.  Lymphadenopathy:    He has no cervical adenopathy.          Assessment & Plan:

## 2011-03-19 NOTE — Patient Instructions (Signed)
Please check your blood pressure once or twice a month. Call if it is over 150/95

## 2011-03-19 NOTE — Assessment & Plan Note (Signed)
BP Readings from Last 3 Encounters:  03/19/11 137/65  11/13/10 110/60  10/18/10 137/76   Variable BP lately Seems okay so far off the med, which he has wanted to stop anyway Will stay off He will monitor Will reevaluate at PE in 2 months

## 2011-03-19 NOTE — Assessment & Plan Note (Signed)
Sounds like he may have had angioedema from the lisinopril Better now Okay to stop the antibiotic Can stop the steroids as well

## 2011-03-31 ENCOUNTER — Telehealth: Payer: Self-pay | Admitting: *Deleted

## 2011-03-31 NOTE — Telephone Encounter (Signed)
Patient called stating that he has had a stomach ache off and on since Thursday. Patient states that his stools have been soft and the pain feels like gas pains.  Patient states that he has not had a fever and no nausea. Patient wants to know what you would recommend? Please advise.

## 2011-03-31 NOTE — Telephone Encounter (Signed)
Probably just wait for now Not clear if he has little bug or what If he can't eat, develops fever or pain is worse, will need appt He can try simethicone (OTC) for the gas and antacid like mylanta or maalox, to see if they help

## 2011-03-31 NOTE — Telephone Encounter (Signed)
Spoke with patient and advised results   

## 2011-05-07 ENCOUNTER — Encounter: Payer: Self-pay | Admitting: Internal Medicine

## 2011-05-07 ENCOUNTER — Ambulatory Visit (INDEPENDENT_AMBULATORY_CARE_PROVIDER_SITE_OTHER): Payer: 59 | Admitting: Internal Medicine

## 2011-05-07 VITALS — BP 167/79 | HR 64 | Temp 98.0°F | Ht 73.0 in | Wt 194.0 lb

## 2011-05-07 DIAGNOSIS — Z0000 Encounter for general adult medical examination without abnormal findings: Secondary | ICD-10-CM

## 2011-05-07 DIAGNOSIS — Z23 Encounter for immunization: Secondary | ICD-10-CM

## 2011-05-07 DIAGNOSIS — B1710 Acute hepatitis C without hepatic coma: Secondary | ICD-10-CM

## 2011-05-07 DIAGNOSIS — Z2911 Encounter for prophylactic immunotherapy for respiratory syncytial virus (RSV): Secondary | ICD-10-CM

## 2011-05-07 DIAGNOSIS — I10 Essential (primary) hypertension: Secondary | ICD-10-CM

## 2011-05-07 LAB — CBC WITH DIFFERENTIAL/PLATELET
Basophils Absolute: 0 10*3/uL (ref 0.0–0.1)
Basophils Relative: 0.5 % (ref 0.0–3.0)
Eosinophils Absolute: 0.3 10*3/uL (ref 0.0–0.7)
Hemoglobin: 14.9 g/dL (ref 13.0–17.0)
Lymphocytes Relative: 29.2 % (ref 12.0–46.0)
Lymphs Abs: 2.4 10*3/uL (ref 0.7–4.0)
MCHC: 33.4 g/dL (ref 30.0–36.0)
MCV: 97.1 fl (ref 78.0–100.0)
Monocytes Absolute: 0.9 10*3/uL (ref 0.1–1.0)
Neutro Abs: 4.5 10*3/uL (ref 1.4–7.7)
RDW: 14.9 % — ABNORMAL HIGH (ref 11.5–14.6)

## 2011-05-07 LAB — HEPATIC FUNCTION PANEL
ALT: 51 U/L (ref 0–53)
Albumin: 3.8 g/dL (ref 3.5–5.2)
Bilirubin, Direct: 0.1 mg/dL (ref 0.0–0.3)
Total Protein: 7.3 g/dL (ref 6.0–8.3)

## 2011-05-07 LAB — BASIC METABOLIC PANEL
CO2: 29 mEq/L (ref 19–32)
Calcium: 9.3 mg/dL (ref 8.4–10.5)
Chloride: 104 mEq/L (ref 96–112)
Glucose, Bld: 98 mg/dL (ref 70–99)
Sodium: 140 mEq/L (ref 135–145)

## 2011-05-07 MED ORDER — TRIAMTERENE-HCTZ 37.5-25 MG PO TABS: 1.0000 | ORAL_TABLET | Freq: Every day | ORAL | Status: DC

## 2011-05-07 NOTE — Assessment & Plan Note (Signed)
Doing generally well Will give zostavax Check PSA after discussion Had colon

## 2011-05-07 NOTE — Assessment & Plan Note (Signed)
BP Readings from Last 3 Encounters:  05/07/11 167/79  03/19/11 137/65  11/13/10 110/60   Actually 190/80 by me on right Will restart HCTZ without the lisinopril and follow up

## 2011-05-07 NOTE — Assessment & Plan Note (Signed)
Had reeval this year No Rx for now

## 2011-05-07 NOTE — Progress Notes (Signed)
Subjective:    Patient ID: Craig Calhoun, male    DOB: Oct 24, 1950, 60 y.o.   MRN: [id number]  HPI Doing fine Has checked his BP occ As high as 150 systolic and usually in 140's diastolics in 80's  Did go back once to the hepatitis C clinic Had blood work but was never called back May just want to wait for new anti-virals  Discussed zostavax Discussed PSA testing  Current Outpatient Prescriptions on File Prior to Visit  Medication Sig Dispense Refill  . fish oil-omega-3 fatty acids 1000 MG capsule Take 2 g by mouth daily.        Marland Kitchen omeprazole (PRILOSEC) 20 MG capsule Take 1 capsule (20 mg total) by mouth daily.  30 capsule  6    Allergies  Allergen Reactions  . Lisinopril     Uvula swelling---?angioedema    Past Medical History  Diagnosis Date  . Hepatitis C     Rx interferon/ribaviron 2004-'05 (recurred after Rx)  . GERD (gastroesophageal reflux disease)   . Hypertension     Past Surgical History  Procedure Date  . Appendectomy   . Lumbar laminectomy   . Colonoscopy   . Liver biopsy     Family History  Problem Relation Age of Onset  . Colon cancer Maternal Aunt   . Diabetes Mother   . Heart disease Father   . Breast cancer Sister   . Hypertension Neg Hx   . Cancer Neg Hx     colon or prostate cancer    History   Social History  . Marital Status: Married    Spouse Name: N/A    Number of Children: 3  . Years of Education: N/A   Occupational History  . Games developer, has farm    Social History Main Topics  . Smoking status: Current Everyday Smoker -- 0.5 packs/day    Types: Cigarettes  . Smokeless tobacco: Never Used  . Alcohol Use: 0.6 oz/week    1 Cans of beer per week  . Drug Use: No  . Sexually Active: Not on file   Other Topics Concern  . Not on file   Social History Narrative  . No narrative on file   Review of Systems  Constitutional: Negative for appetite change, fatigue and unexpected weight change.       Wears seat  belt generally (discussed)  HENT: Negative for hearing loss, congestion, rhinorrhea, dental problem and tinnitus.        Regular with dentist  Eyes: Negative for visual disturbance.       No diplopia or unilateral vision loss  Respiratory: Positive for cough. Negative for chest tightness and shortness of breath.        Recent cold with lots of cough--has resolved Occ cough secondary to smoking  Cardiovascular: Negative for chest pain, palpitations and leg swelling.  Gastrointestinal: Negative for nausea, vomiting, abdominal pain, constipation and blood in stool.       Stomach virus 1-2 months ago---resolved No sig heartburn  Genitourinary: Negative for dysuria, urgency, frequency and difficulty urinating.       No sexual problems  Musculoskeletal: Positive for arthralgias. Negative for back pain and joint swelling.       Some right knee pain--only occ Uses ibuprofen rarely Occ gets stiff neck when sitting in stand hunting  Skin: Negative for rash.       Mole on inner left thigh---wants it rechecked  Neurological: Negative for dizziness, syncope, weakness, light-headedness, numbness and headaches.  Hematological: Negative for adenopathy. Does not bruise/bleed easily.  Psychiatric/Behavioral: Negative for sleep disturbance and dysphoric mood. The patient is not nervous/anxious.        Objective:   Physical Exam  Constitutional: He is oriented to person, place, and time. He appears well-developed and well-nourished. No distress.  HENT:  Head: Normocephalic and atraumatic.  Right Ear: External ear normal.  Left Ear: External ear normal.  Mouth/Throat: Oropharynx is clear and moist. No oropharyngeal exudate.       TMs normal  Eyes: Conjunctivae and EOM are normal. Pupils are equal, round, and reactive to light.       Fundi benign  Neck: Normal range of motion. Neck supple. No thyromegaly present.  Cardiovascular: Normal rate, regular rhythm, normal heart sounds and intact distal  pulses.  Exam reveals no gallop.   No murmur heard. Pulmonary/Chest: Effort normal and breath sounds normal. No respiratory distress. He has no wheezes. He has no rales.  Abdominal: Soft. He exhibits no mass. There is no tenderness.       No HSM  Musculoskeletal: Normal range of motion. He exhibits no edema and no tenderness.  Lymphadenopathy:    He has no cervical adenopathy.  Neurological: He is alert and oriented to person, place, and time.  Skin: No rash noted.       seb keratoses on left thigh  Psychiatric: He has a normal mood and affect. His behavior is normal. Judgment and thought content normal.          Assessment & Plan:

## 2011-06-19 ENCOUNTER — Ambulatory Visit: Payer: 59 | Admitting: Internal Medicine

## 2011-07-01 ENCOUNTER — Ambulatory Visit (INDEPENDENT_AMBULATORY_CARE_PROVIDER_SITE_OTHER): Payer: 59 | Admitting: Internal Medicine

## 2011-07-01 ENCOUNTER — Encounter: Payer: Self-pay | Admitting: Internal Medicine

## 2011-07-01 VITALS — BP 128/70 | HR 57 | Temp 97.4°F | Ht 73.0 in | Wt 194.0 lb

## 2011-07-01 DIAGNOSIS — I10 Essential (primary) hypertension: Secondary | ICD-10-CM

## 2011-07-01 LAB — BASIC METABOLIC PANEL
Chloride: 103 mEq/L (ref 96–112)
GFR: 100.21 mL/min (ref >60.00)
Potassium: 4.1 mEq/L (ref 3.5–5.1)
Sodium: 140 mEq/L (ref 135–145)

## 2011-07-01 NOTE — Assessment & Plan Note (Signed)
BP Readings from Last 3 Encounters:  07/01/11 128/70  05/07/11 167/79  03/19/11 137/65   Much better No problems with med Will continue and check met b

## 2011-07-01 NOTE — Progress Notes (Signed)
  Subjective:    Patient ID: Craig Calhoun, male    DOB: [birth year], 61 y.o.   MRN: [id number]  HPI Has done fine with the medicine Has checked BP occ Systolic 127-130s systolic  No dizziness or syncope No chest pain No SOB Occ has sense of something "different" in heart---at rest, lasts a second or so (PAC?)  Gave info on 1-800-QUITNOW  Current Outpatient Prescriptions on File Prior to Visit  Medication Sig Dispense Refill  . omeprazole (PRILOSEC) 20 MG capsule Take 1 capsule (20 mg total) by mouth daily.  30 capsule  6  . triamterene-hydrochlorothiazide (MAXZIDE-25) 37.5-25 MG per tablet Take 1 each (1 tablet total) by mouth daily.  90 tablet  3    Allergies  Allergen Reactions  . Lisinopril     Uvula swelling---?angioedema    Past Medical History  Diagnosis Date  . Hepatitis C     Rx interferon/ribaviron 2004-'05 (recurred after Rx)  . GERD (gastroesophageal reflux disease)   . Hypertension     Past Surgical History  Procedure Date  . Appendectomy   . Lumbar laminectomy   . Colonoscopy   . Liver biopsy     Family History  Problem Relation Age of Onset  . Colon cancer Maternal Aunt   . Diabetes Mother   . Heart disease Father   . Breast cancer Sister   . Hypertension Neg Hx   . Cancer Neg Hx     colon or prostate cancer    History   Social History  . Marital Status: Married    Spouse Name: N/A    Number of Children: 3  . Years of Education: N/A   Occupational History  . Games developer, has farm    Social History Main Topics  . Smoking status: Current Everyday Smoker -- 0.5 packs/day    Types: Cigarettes  . Smokeless tobacco: Never Used  . Alcohol Use: 0.6 oz/week    1 Cans of beer per week  . Drug Use: No  . Sexually Active: Not on file   Other Topics Concern  . Not on file   Social History Narrative  . No narrative on file   Review of Systems Sleeps okay--occ gets some aching. This is better with 2-3 ibuprofen Appetite is  okay     Objective:   Physical Exam  Constitutional: He appears well-developed and well-nourished. No distress.  Neck: Normal range of motion. Neck supple.  Cardiovascular: Normal rate, regular rhythm and normal heart sounds.  Exam reveals no gallop.   No murmur heard. Pulmonary/Chest: Effort normal and breath sounds normal. No respiratory distress. He has no wheezes. He has no rales.  Musculoskeletal: He exhibits no edema and no tenderness.  Lymphadenopathy:    He has no cervical adenopathy.  Psychiatric: He has a normal mood and affect. His behavior is normal. Judgment and thought content normal.          Assessment & Plan:

## 2011-07-23 ENCOUNTER — Other Ambulatory Visit: Payer: Self-pay | Admitting: *Deleted

## 2011-07-23 MED ORDER — OMEPRAZOLE 20 MG PO CPDR: 20.0000 mg | DELAYED_RELEASE_CAPSULE | Freq: Every day | ORAL | Status: DC

## 2012-01-06 ENCOUNTER — Ambulatory Visit (INDEPENDENT_AMBULATORY_CARE_PROVIDER_SITE_OTHER): Payer: 59 | Admitting: Internal Medicine

## 2012-01-06 ENCOUNTER — Encounter: Payer: Self-pay | Admitting: Internal Medicine

## 2012-01-06 VITALS — BP 130/80 | HR 53 | Temp 97.2°F | Ht 73.0 in | Wt 200.0 lb

## 2012-01-06 DIAGNOSIS — L570 Actinic keratosis: Secondary | ICD-10-CM | POA: Insufficient documentation

## 2012-01-06 DIAGNOSIS — I10 Essential (primary) hypertension: Secondary | ICD-10-CM

## 2012-01-06 NOTE — Assessment & Plan Note (Signed)
2 lesions on right hand treated with liquid nitrogen 45 seconds x 2 each Tolerated well Discussed home care

## 2012-01-06 NOTE — Assessment & Plan Note (Signed)
BP Readings from Last 3 Encounters:  01/06/12 130/80  07/01/11 128/70  05/07/11 167/79   Good control No problems with the med continue

## 2012-01-06 NOTE — Progress Notes (Signed)
  Subjective:    Patient ID: Craig Calhoun, male    DOB: 08-19-1950, 61 y.o.   MRN: [id number]  HPI Doing okay No problems with med  Hasn't checked BP No headaches No chest pain No SOB Exercise tolerance is stable No dizziness or syncope  Has 2 lesions on right hand he wants checked  Has been decreasing cigarettes Down to 10 or under a day Uses gum also Discussed quit date---not quite ready  Current Outpatient Prescriptions on File Prior to Visit  Medication Sig Dispense Refill  . omeprazole (PRILOSEC) 20 MG capsule Take 1 capsule (20 mg total) by mouth daily.  30 capsule  6  . triamterene-hydrochlorothiazide (MAXZIDE-25) 37.5-25 MG per tablet Take 1 each (1 tablet total) by mouth daily.  90 tablet  3    Allergies  Allergen Reactions  . Lisinopril     Uvula swelling---?angioedema    Past Medical History  Diagnosis Date  . Hepatitis C     Rx interferon/ribaviron 2004-'05 (recurred after Rx)  . GERD (gastroesophageal reflux disease)   . Hypertension     Past Surgical History  Procedure Date  . Appendectomy   . Lumbar laminectomy   . Colonoscopy   . Liver biopsy     Family History  Problem Relation Age of Onset  . Colon cancer Maternal Aunt   . Diabetes Mother   . Heart disease Father   . Breast cancer Sister   . Hypertension Neg Hx   . Cancer Neg Hx     colon or prostate cancer    History   Social History  . Marital Status: Married    Spouse Name: N/A    Number of Children: 3  . Years of Education: N/A   Occupational History  . Games developer, has farm    Social History Main Topics  . Smoking status: Current Everyday Smoker -- 0.5 packs/day    Types: Cigarettes  . Smokeless tobacco: Never Used  . Alcohol Use: 0.6 oz/week    1 Cans of beer per week  . Drug Use: No  . Sexually Active: Not on file   Other Topics Concern  . Not on file   Social History Narrative  . No narrative on file   Review of Systems Sleeps okay No  sexual changes No orthostasis    Objective:   Physical Exam  Constitutional: He appears well-developed and well-nourished. No distress.  Neck: Normal range of motion. Neck supple. No thyromegaly present.  Cardiovascular: Normal rate, regular rhythm and normal heart sounds.  Exam reveals no gallop.   No murmur heard. Pulmonary/Chest: Effort normal and breath sounds normal. No respiratory distress. He has no wheezes. He has no rales.  Musculoskeletal: He exhibits no edema and no tenderness.  Lymphadenopathy:    He has no cervical adenopathy.  Skin:       2 actinics on dorsum of right hand ?early actinic on dorsum of left hand seb keratosis on left upper thigh and flat nevus near there  Psychiatric: He has a normal mood and affect. His behavior is normal. Thought content normal.          Assessment & Plan:

## 2012-01-07 ENCOUNTER — Ambulatory Visit: Payer: 59 | Admitting: Internal Medicine

## 2012-03-25 ENCOUNTER — Telehealth: Payer: Self-pay | Admitting: Internal Medicine

## 2012-03-25 MED ORDER — OMEPRAZOLE 20 MG PO CPDR: 20.0000 mg | DELAYED_RELEASE_CAPSULE | Freq: Every day | ORAL | Status: DC

## 2012-03-25 NOTE — Telephone Encounter (Signed)
Medication sent to pharmacy  

## 2012-03-25 NOTE — Telephone Encounter (Signed)
Refill- omeprazole dr 20mg  cer. Take one capsule each day. Qty 30 last fill 9.2.13

## 2012-06-11 ENCOUNTER — Other Ambulatory Visit: Payer: Self-pay | Admitting: *Deleted

## 2012-06-11 MED ORDER — TRIAMTERENE-HCTZ 37.5-25 MG PO TABS: 1.0000 | ORAL_TABLET | Freq: Every day | ORAL | Status: DC

## 2012-06-11 MED ORDER — OMEPRAZOLE 20 MG PO CPDR: 20.0000 mg | DELAYED_RELEASE_CAPSULE | Freq: Every day | ORAL | Status: DC

## 2012-06-14 ENCOUNTER — Ambulatory Visit (INDEPENDENT_AMBULATORY_CARE_PROVIDER_SITE_OTHER): Payer: 59 | Admitting: Internal Medicine

## 2012-06-14 ENCOUNTER — Encounter: Payer: Self-pay | Admitting: Internal Medicine

## 2012-06-14 VITALS — BP 146/76 | HR 68 | Temp 98.8°F | Wt 209.0 lb

## 2012-06-14 DIAGNOSIS — R42 Dizziness and giddiness: Secondary | ICD-10-CM

## 2012-06-14 DIAGNOSIS — M25562 Pain in left knee: Secondary | ICD-10-CM | POA: Insufficient documentation

## 2012-06-14 DIAGNOSIS — M25569 Pain in unspecified knee: Secondary | ICD-10-CM

## 2012-06-14 NOTE — Assessment & Plan Note (Signed)
Intermittent BP running a little high so I don't think he is overtreated Will just watch for now as some better in past week

## 2012-06-14 NOTE — Assessment & Plan Note (Signed)
No overt weakness but highly antalgic gait May have partial meniscus tear Will set up with orthopedic surgeon

## 2012-06-14 NOTE — Progress Notes (Signed)
Subjective:    Patient ID: Craig Calhoun, male    DOB: September 01, 1950, 60 y.o.   MRN: [id number]  HPI Having trouble with left knee Noted pain at work about 10 days ago Intermittent pain-- it "it goes a certain way" Points under patella as the pain part--but also medial and lateral Really hurts at night as he tries to move it---- propping on pillow helps if he keeps it straight Some increase pain with steps  No known injury  Tried ibuprofen for 4 days Helped decrease the swelling some and the pain but he backed off due to concern about the med (800mg  once a day)  Has had a couple of brief periods of orthostatic dizziness Seems to be some better in past week No oral intake No chest pain No SOB unless he really pushes it---like carrying heavy loads or on steps (relates to his cigarettes--thinking about stopping) No syncope No edema  Current Outpatient Prescriptions on File Prior to Visit  Medication Sig Dispense Refill  . omeprazole (PRILOSEC) 20 MG capsule Take 1 capsule (20 mg total) by mouth daily.  30 capsule  6  . triamterene-hydrochlorothiazide (MAXZIDE-25) 37.5-25 MG per tablet Take 1 each (1 tablet total) by mouth daily.  90 tablet  1  . fish oil-omega-3 fatty acids 1000 MG capsule Take 2 g by mouth daily.        Allergies  Allergen Reactions  . Lisinopril     Uvula swelling---?angioedema    Past Medical History  Diagnosis Date  . Hepatitis C     Rx interferon/ribaviron 2004-'05 (recurred after Rx)  . GERD (gastroesophageal reflux disease)   . Hypertension     Past Surgical History  Procedure Date  . Appendectomy   . Lumbar laminectomy   . Colonoscopy   . Liver biopsy     Family History  Problem Relation Age of Onset  . Colon cancer Maternal Aunt   . Diabetes Mother   . Heart disease Father   . Breast cancer Sister   . Hypertension Neg Hx   . Cancer Neg Hx     colon or prostate cancer    History   Social History  . Marital Status: Married   Spouse Name: N/A    Number of Children: 3  . Years of Education: N/A   Occupational History  . Games developer, has farm    Social History Main Topics  . Smoking status: Current Every Day Smoker -- 0.5 packs/day    Types: Cigarettes  . Smokeless tobacco: Never Used  . Alcohol Use: 0.6 oz/week    1 Cans of beer per week  . Drug Use: No  . Sexually Active: Not on file   Other Topics Concern  . Not on file   Social History Narrative  . No narrative on file   Review of Systems Sleeps okay other than when knee hurts Appetite is normal     Objective:   Physical Exam  Constitutional: He appears well-developed and well-nourished. No distress.  Neck: Normal range of motion. Neck supple.  Cardiovascular: Normal rate, regular rhythm and normal heart sounds.  Exam reveals no gallop.   No murmur heard.      Regular skipped beats  Pulmonary/Chest: Effort normal and breath sounds normal. No respiratory distress. He has no wheezes. He has no rales.  Musculoskeletal: He exhibits no edema.       Mild swelling in left knee No ligament instability Pain with any manipulation but particularly has pain  with lateral meniscus stress Triangular patella  Lymphadenopathy:    He has no cervical adenopathy.          Assessment & Plan:

## 2012-06-14 NOTE — Patient Instructions (Signed)
Please take ibuprofen 600-800mg  regular three times a day

## 2012-07-06 ENCOUNTER — Ambulatory Visit: Payer: Self-pay | Admitting: Orthopedic Surgery

## 2012-07-27 ENCOUNTER — Encounter: Payer: 59 | Admitting: Internal Medicine

## 2012-07-27 DIAGNOSIS — Z0289 Encounter for other administrative examinations: Secondary | ICD-10-CM

## 2012-09-07 ENCOUNTER — Ambulatory Visit: Payer: Self-pay | Admitting: Orthopedic Surgery

## 2012-09-09 ENCOUNTER — Telehealth: Payer: Self-pay

## 2012-09-09 NOTE — Telephone Encounter (Signed)
Pre op nurse for [facility] left v/m requesting recent OV, labs or EKG; pt scheduled for surgery on 09/15/12.Please advise.

## 2012-09-10 NOTE — Telephone Encounter (Signed)
Which OV should I send? Last EKG was 2011 and labs 2012

## 2012-09-10 NOTE — Telephone Encounter (Signed)
Send last exam in December and PE note from 2012 Most recent EKG and labs

## 2012-09-10 NOTE — Telephone Encounter (Signed)
Records faxed.

## 2012-09-16 ENCOUNTER — Ambulatory Visit (INDEPENDENT_AMBULATORY_CARE_PROVIDER_SITE_OTHER): Payer: 59 | Admitting: Family Medicine

## 2012-09-16 ENCOUNTER — Encounter: Payer: Self-pay | Admitting: Family Medicine

## 2012-09-16 ENCOUNTER — Telehealth: Payer: Self-pay | Admitting: Family Medicine

## 2012-09-16 VITALS — BP 140/76 | HR 71 | Temp 98.0°F | Wt 212.5 lb

## 2012-09-16 DIAGNOSIS — H8110 Benign paroxysmal vertigo, unspecified ear: Secondary | ICD-10-CM

## 2012-09-16 NOTE — Telephone Encounter (Signed)
Seen by Dr D Note reviewed

## 2012-09-16 NOTE — Telephone Encounter (Signed)
[facility] Triage Call Report Triage Record Num: [id number] Operator: Albertine Grates Patient Name: Craig Calhoun Call Date & Time: 09/15/2012 9:52:06PM Patient Phone: PCP: Tillman Abide Patient Gender: Male PCP Fax : [phone] Patient DOB: 1950/09/02 Practice Name: [facility] Reason for Call: Caller: Susan/Spouse; PCP: Tillman Abide (Family Practice); CB#: [phone]; Patient has complained with "neck tingling" 3-26. Has felt dizzy. Has checked blood pressure and is 150/90. Denies emergent symptoms. Per Dizziness protocol, advised appointment 3-27 and scheduled for 0930 with Dr. Para March. Protocol(s) Used: Dizziness or Vertigo Recommended Outcome per Protocol: See Provider within 24 hours Reason for Outcome: Symptoms worsen with movement of head or looking up AND not previously evaluated Care Advice: A temporary drop in blood pressure sometimes occurs with a quick change to an upright position (postural hypotension) and may cause light-headedness or dizziness. Change position slowly. Making a habit of rising slowly and sitting for a few minutes before standing to walk usually relieves the feeling of faintness. ~ When feeling faint, find a place to lie down if possible, and elevate legs 8 to 12 inches above the heart. If unable to lie down, sit in a chair and lower head between the knees for 3 to 5 minutes. If standing and not able to sit, cross legs and squeeze the knees together to move blood to the heart. ~ 09/15/2012 10:08:34PM Page 1 of 1 [email or web address] [facility] Triage Call Report Triage Record Num: [id number] Operator: Albertine Grates Patient Name: [name] Call Date & Time: 09/15/2012 9:52:06PM Patient Phone: PCP: Tillman Abide Patient Gender: Male PCP Fax : [phone] Patient DOB: [birth year] Practice Name[facility] Reason for Call: Caller: Susan/Spouse; PCP: Tillman Abide (Family Practice); CB#: [phone]; Patient has complained  with "neck tingling" 3-26. Has felt dizzy. Has checked blood pressure and is 150/90. Denies emergent symptoms. Per Dizziness protocol, advised appointment 3-27 and scheduled for 0930 with Dr. Para March. Protocol(s) Used: Hypertension, Diagnosed or Suspected Recommended Outcome per Protocol: Call Provider within 72 Hours Reason for Outcome: Evaluated by provider and has question/concern about their condition, treatment plan, treatment options, follow-up appointments, or other follow-up care. Care Advice: ~ 09/15/2012 10:08:36PM Page 1 of 1 [email or web address]

## 2012-09-16 NOTE — Patient Instructions (Addendum)
Use the bed side exercises and take meclizine if needed.  This should gradually improve. Calibrate your BP cuff at home.  Take care.

## 2012-09-16 NOTE — Progress Notes (Signed)
Night before last.  He was in bed.  He turned over and then got dizzy.  He got up and did well at work yesterday.  Last night at home again he checked his BP and it was ~170/90 (he may have been nervous about checking his BP at home).  He just felt "funny" last night w/o any focal neuro changes. This AM he noted a return of sx when he was leaning over and turned his head (getting his boots out from under the bed), not upon standing back up.  The episode tends to be brief.   He hasn't had episodes like this before the last few days.   Meds, vitals, and allergies reviewed.   ROS: See HPI.  Otherwise, noncontributory.  GEN: nad, alert and oriented HEENT: mucous membranes moist, TM wnl NECK: supple w/o LA CV: rrr.  PULM: ctab, no inc wob ABD: soft, +bs EXT: no edema SKIN: no acute rash CN 2-12 wnl B, S/S/DTR wnl x4 DHP neg

## 2012-09-16 NOTE — Assessment & Plan Note (Signed)
Reassuring exam.  Should resolve in the next few days but may happen again in the future.  D/w pt about anatomy and bedside exercises.  Meclizine or similar prn.  F/u prn.

## 2012-09-21 HISTORY — PX: MENISCUS REPAIR: SHX5179

## 2012-10-21 ENCOUNTER — Encounter: Payer: Self-pay | Admitting: Internal Medicine

## 2012-10-21 ENCOUNTER — Ambulatory Visit (INDEPENDENT_AMBULATORY_CARE_PROVIDER_SITE_OTHER): Payer: BC Managed Care – PPO | Admitting: Internal Medicine

## 2012-10-21 VITALS — BP 132/80 | HR 72 | Temp 98.1°F | Wt 207.0 lb

## 2012-10-21 DIAGNOSIS — R5383 Other fatigue: Secondary | ICD-10-CM | POA: Insufficient documentation

## 2012-10-21 DIAGNOSIS — R5381 Other malaise: Secondary | ICD-10-CM

## 2012-10-21 MED ORDER — DOXYCYCLINE HYCLATE 100 MG PO TABS: 100.0000 mg | ORAL_TABLET | Freq: Two times a day (BID) | ORAL | Status: DC

## 2012-10-21 NOTE — Progress Notes (Signed)
  Subjective:    Patient ID: Craig Calhoun, male    DOB: Sep 21, 1950, 62 y.o.   MRN: [id number]  HPI Felt drained yesterday and worse at night Decreased energy  Some brief intermittent head pain Had tick bite ~2 weeks ago---was on a couple of days (was on the right nipple and he didn't notice it right away)  No clear fever No rash at site of bite or elsewhere Has leg and hip aching but no joint swelling  Current Outpatient Prescriptions on File Prior to Visit  Medication Sig Dispense Refill  . omeprazole (PRILOSEC) 20 MG capsule Take 1 capsule (20 mg total) by mouth daily.  30 capsule  6  . triamterene-hydrochlorothiazide (MAXZIDE-25) 37.5-25 MG per tablet Take 1 each (1 tablet total) by mouth daily.  90 tablet  1   No current facility-administered medications on file prior to visit.    Allergies  Allergen Reactions  . Lisinopril     Uvula swelling---?angioedema    Past Medical History  Diagnosis Date  . Hepatitis C     Rx interferon/ribaviron 2004-'05 (recurred after Rx)  . GERD (gastroesophageal reflux disease)   . Hypertension     Past Surgical History  Procedure Laterality Date  . Appendectomy    . Lumbar laminectomy    . Colonoscopy    . Liver biopsy    . Meniscus repair Left 4/14    Family History  Problem Relation Age of Onset  . Colon cancer Maternal Aunt   . Diabetes Mother   . Heart disease Father   . Breast cancer Sister   . Hypertension Neg Hx   . Cancer Neg Hx     colon or prostate cancer    History   Social History  . Marital Status: Married    Spouse Name: N/A    Number of Children: 3  . Years of Education: N/A   Occupational History  . Games developer, has farm    Social History Main Topics  . Smoking status: Current Every Day Smoker -- 0.50 packs/day    Types: Cigarettes  . Smokeless tobacco: Never Used  . Alcohol Use: 0.6 oz/week    1 Cans of beer per week  . Drug Use: No  . Sexually Active: Not on file   Other  Topics Concern  . Not on file   Social History Narrative  . No narrative on file   Review of Systems No nausea or vomiting Appetite off since yesterday Weight is down a little from last visit Sleeps okay---did have some sweat last night    Objective:   Physical Exam  Constitutional: He appears well-developed and well-nourished. No distress.  Looks a little washed out  Neck: Normal range of motion. Neck supple. No thyromegaly present.  Cardiovascular: Normal rate, regular rhythm and normal heart sounds.  Exam reveals no gallop.   No murmur heard. Pulmonary/Chest: Effort normal and breath sounds normal. No respiratory distress. He has no wheezes. He has no rales.  Abdominal: Soft. There is no tenderness.  No apparent hepatomegaly  Musculoskeletal: He exhibits no edema and no tenderness.  No synovitis  Lymphadenopathy:    He has no cervical adenopathy.  Skin: No rash noted. No erythema.  Bite site has eschar but not healed yet Not inflamed          Assessment & Plan:

## 2012-10-21 NOTE — Assessment & Plan Note (Signed)
Non specific  Some head pain, chills last night, recent tick bite Will try empiric doxy though no clear infection Check labs

## 2012-10-22 LAB — CBC WITH DIFFERENTIAL/PLATELET
Basophils Absolute: 0.1 10*3/uL (ref 0.0–0.1)
Basophils Relative: 0.7 % (ref 0.0–3.0)
Eosinophils Absolute: 0.3 10*3/uL (ref 0.0–0.7)
Hemoglobin: 15.2 g/dL (ref 13.0–17.0)
Lymphocytes Relative: 26.7 % (ref 12.0–46.0)
MCHC: 34.3 g/dL (ref 30.0–36.0)
MCV: 93.8 fl (ref 78.0–100.0)
Monocytes Absolute: 1.2 10*3/uL — ABNORMAL HIGH (ref 0.1–1.0)
Neutro Abs: 5.8 10*3/uL (ref 1.4–7.7)
Neutrophils Relative %: 57.8 % (ref 43.0–77.0)
RBC: 4.72 Mil/uL (ref 4.22–5.81)
RDW: 14.1 % (ref 11.5–14.6)

## 2012-10-22 LAB — SEDIMENTATION RATE: Sed Rate: 14 mm/hr (ref 0–22)

## 2012-10-25 ENCOUNTER — Telehealth: Payer: Self-pay

## 2012-10-25 LAB — BASIC METABOLIC PANEL WITH GFR
BUN: 21 mg/dL (ref 6–23)
CO2: 27 meq/L (ref 19–32)
Calcium: 9 mg/dL (ref 8.4–10.5)
Chloride: 101 meq/L (ref 96–112)
Creatinine, Ser: 1 mg/dL (ref 0.4–1.5)
GFR: 78.65 mL/min
Glucose, Bld: 94 mg/dL (ref 70–99)
Potassium: 4 meq/L (ref 3.5–5.1)
Sodium: 136 meq/L (ref 135–145)

## 2012-10-25 LAB — HEPATIC FUNCTION PANEL
ALT: 48 U/L (ref 0–53)
Albumin: 4.4 g/dL (ref 3.5–5.2)
Total Protein: 8.2 g/dL (ref 6.0–8.3)

## 2012-10-25 NOTE — Telephone Encounter (Signed)
Please check with Terri

## 2012-10-25 NOTE — Telephone Encounter (Signed)
There are no lab results but it looks like the lab was done

## 2012-10-25 NOTE — Telephone Encounter (Signed)
Pt left v/m requesting lab results done on 10/21/12.Please advise.

## 2012-10-26 NOTE — Telephone Encounter (Signed)
Terri notified that the chemistry analyzer was down until all results are done, they are there now. Spoke with patient and advised results  Lab appt scheduled

## 2012-10-27 ENCOUNTER — Telehealth: Payer: Self-pay | Admitting: *Deleted

## 2012-10-27 ENCOUNTER — Other Ambulatory Visit (INDEPENDENT_AMBULATORY_CARE_PROVIDER_SITE_OTHER): Payer: BC Managed Care – PPO

## 2012-10-27 DIAGNOSIS — R5383 Other fatigue: Secondary | ICD-10-CM

## 2012-10-27 DIAGNOSIS — R6889 Other general symptoms and signs: Secondary | ICD-10-CM

## 2012-10-27 DIAGNOSIS — R7989 Other specified abnormal findings of blood chemistry: Secondary | ICD-10-CM

## 2012-10-27 DIAGNOSIS — R5381 Other malaise: Secondary | ICD-10-CM

## 2012-10-27 LAB — T4, FREE: Free T4: 0.99 ng/dL (ref 0.60–1.60)

## 2012-10-27 LAB — TSH: TSH: 0.54 u[IU]/mL (ref 0.35–5.50)

## 2012-10-27 NOTE — Telephone Encounter (Signed)
Patient wanted to know that since his labs are back and theres no concern for tick fever, does he need to continue the doxycyline?

## 2012-10-27 NOTE — Telephone Encounter (Signed)
Actually, the labs couldn't rule out tick fever so I think it is best if he just finished out the doxy

## 2012-10-27 NOTE — Telephone Encounter (Signed)
Spoke with patient and advised results   

## 2013-01-13 ENCOUNTER — Other Ambulatory Visit: Payer: Self-pay | Admitting: *Deleted

## 2013-01-13 MED ORDER — TRIAMTERENE-HCTZ 37.5-25 MG PO TABS: 1.0000 | ORAL_TABLET | Freq: Every day | ORAL | Status: DC

## 2013-03-03 ENCOUNTER — Telehealth: Payer: Self-pay | Admitting: *Deleted

## 2013-03-03 MED ORDER — ZOSTER VACCINE LIVE 19400 UNT/0.65ML ~~LOC~~ SOLR: 0.6500 mL | Freq: Once | SUBCUTANEOUS | Status: DC

## 2013-03-03 NOTE — Telephone Encounter (Signed)
Pt request a rx for shingles vaccine sent [facility]. He request to be notified if approved and when it is sent in.

## 2013-03-03 NOTE — Telephone Encounter (Signed)
Okay to send Rx 

## 2013-03-03 NOTE — Telephone Encounter (Signed)
rx sent to pharmacy by e-script Spoke with patient and advised results   

## 2013-04-21 ENCOUNTER — Other Ambulatory Visit: Payer: Self-pay | Admitting: *Deleted

## 2013-04-21 MED ORDER — OMEPRAZOLE 20 MG PO CPDR: 20.0000 mg | DELAYED_RELEASE_CAPSULE | Freq: Every day | ORAL | Status: DC

## 2013-04-28 ENCOUNTER — Other Ambulatory Visit: Payer: Self-pay

## 2013-05-04 ENCOUNTER — Ambulatory Visit (INDEPENDENT_AMBULATORY_CARE_PROVIDER_SITE_OTHER): Payer: BC Managed Care – PPO | Admitting: Internal Medicine

## 2013-05-04 ENCOUNTER — Encounter: Payer: Self-pay | Admitting: Internal Medicine

## 2013-05-04 VITALS — BP 140/80 | HR 57 | Temp 98.0°F | Wt 201.0 lb

## 2013-05-04 DIAGNOSIS — B1710 Acute hepatitis C without hepatic coma: Secondary | ICD-10-CM

## 2013-05-04 DIAGNOSIS — M7989 Other specified soft tissue disorders: Secondary | ICD-10-CM

## 2013-05-04 LAB — CBC WITH DIFFERENTIAL/PLATELET
Basophils Absolute: 0.1 10*3/uL (ref 0.0–0.1)
Basophils Relative: 0.7 % (ref 0.0–3.0)
Eosinophils Relative: 3.5 % (ref 0.0–5.0)
Hemoglobin: 14.8 g/dL (ref 13.0–17.0)
Lymphocytes Relative: 30.7 % (ref 12.0–46.0)
Monocytes Relative: 10.8 % (ref 3.0–12.0)
Neutro Abs: 4.6 10*3/uL (ref 1.4–7.7)
RBC: 4.69 Mil/uL (ref 4.22–5.81)

## 2013-05-04 LAB — BASIC METABOLIC PANEL
BUN: 16 mg/dL (ref 6–23)
CO2: 28 mEq/L (ref 19–32)
Chloride: 101 mEq/L (ref 96–112)
Creatinine, Ser: 0.8 mg/dL (ref 0.4–1.5)
Glucose, Bld: 117 mg/dL — ABNORMAL HIGH (ref 70–99)

## 2013-05-04 LAB — HEPATIC FUNCTION PANEL
Albumin: 3.9 g/dL (ref 3.5–5.2)
Total Protein: 7.9 g/dL (ref 6.0–8.3)

## 2013-05-04 NOTE — Assessment & Plan Note (Signed)
Seems isolated to the plantar forefoot---so I think it is mechanical. Probably related to his hunting boots Discussed changing shoes No signs of true edema

## 2013-05-04 NOTE — Progress Notes (Signed)
  Subjective:    Patient ID: Craig Calhoun, male    DOB: Dec 15, 1950, 62 y.o.   MRN: [id number]  HPI Having some swelling in his feet----the pad at his forefoot Started 2.5 weeks ago No ankle or other foot swelling  Not really painful Has been hunting a lot----wearing his rubber boots and may have some discomfort  No SOB Not too active--sits in blind for hunting Some DOE---feels this is stable and relates to the smoking Still smoking  Current Outpatient Prescriptions on File Prior to Visit  Medication Sig Dispense Refill  . omeprazole (PRILOSEC) 20 MG capsule Take 1 capsule (20 mg total) by mouth daily.  30 capsule  6  . triamterene-hydrochlorothiazide (MAXZIDE-25) 37.5-25 MG per tablet Take 1 tablet by mouth daily.  90 tablet  1   No current facility-administered medications on file prior to visit.    Allergies  Allergen Reactions  . Lisinopril     Uvula swelling---?angioedema    Past Medical History  Diagnosis Date  . Hepatitis C     Rx interferon/ribaviron 2004-'05 (recurred after Rx)  . GERD (gastroesophageal reflux disease)   . Hypertension     Past Surgical History  Procedure Laterality Date  . Appendectomy    . Lumbar laminectomy    . Colonoscopy    . Liver biopsy    . Meniscus repair Left 4/14    Family History  Problem Relation Age of Onset  . Colon cancer Maternal Aunt   . Diabetes Mother   . Heart disease Father   . Breast cancer Sister   . Hypertension Neg Hx   . Cancer Neg Hx     colon or prostate cancer    History   Social History  . Marital Status: Married    Spouse Name: N/A    Number of Children: 3  . Years of Education: N/A   Occupational History  . Games developer, has farm    Social History Main Topics  . Smoking status: Current Every Day Smoker -- 0.50 packs/day    Types: Cigarettes  . Smokeless tobacco: Never Used  . Alcohol Use: 0.6 oz/week    1 Cans of beer per week  . Drug Use: No  . Sexual Activity: Not on file    Other Topics Concern  . Not on file   Social History Narrative  . No narrative on file   Review of Systems Nocturia x 2-- this seems to be more prominent Not getting great rest---tossing and turning a lot. Sleeps better on firmer mattress. Then he notices his feet more Weight is down 6#    Objective:   Physical Exam  Constitutional: He appears well-developed and well-nourished. No distress.  Neck: Normal range of motion. Neck supple. No thyromegaly present.  Cardiovascular: Normal rate, regular rhythm, normal heart sounds and intact distal pulses.  Exam reveals no gallop.   No murmur heard. Pulmonary/Chest: Effort normal and breath sounds normal. No respiratory distress. He has no wheezes. He has no rales.  Abdominal: Soft. There is no tenderness.  No HSM  Musculoskeletal: He exhibits no edema.  No true swelling Mild tenderness along ball of foot near toes  Lymphadenopathy:    He has no cervical adenopathy.  Skin: No rash noted.  Psychiatric: He has a normal mood and affect. His behavior is normal.          Assessment & Plan:

## 2013-05-04 NOTE — Assessment & Plan Note (Signed)
Past Rx Concerned this could be a problem Will recheck labs

## 2013-05-04 NOTE — Progress Notes (Signed)
Pre-visit discussion using our clinic review tool. No additional management support is needed unless otherwise documented below in the visit note.  

## 2013-07-08 ENCOUNTER — Ambulatory Visit (INDEPENDENT_AMBULATORY_CARE_PROVIDER_SITE_OTHER)
Admission: RE | Admit: 2013-07-08 | Discharge: 2013-07-08 | Disposition: A | Discharge status: Home / Self Care | Payer: BC Managed Care – PPO | Source: Ambulatory Visit | Attending: Internal Medicine | Admitting: Internal Medicine

## 2013-07-08 ENCOUNTER — Telehealth: Payer: Self-pay

## 2013-07-08 ENCOUNTER — Encounter: Payer: Self-pay | Admitting: Internal Medicine

## 2013-07-08 ENCOUNTER — Ambulatory Visit (INDEPENDENT_AMBULATORY_CARE_PROVIDER_SITE_OTHER): Payer: BC Managed Care – PPO | Admitting: Internal Medicine

## 2013-07-08 VITALS — BP 122/80 | HR 100 | Temp 101.2°F | Wt 198.5 lb

## 2013-07-08 DIAGNOSIS — R059 Cough, unspecified: Secondary | ICD-10-CM

## 2013-07-08 DIAGNOSIS — R0602 Shortness of breath: Secondary | ICD-10-CM

## 2013-07-08 DIAGNOSIS — R51 Headache: Secondary | ICD-10-CM

## 2013-07-08 DIAGNOSIS — R05 Cough: Secondary | ICD-10-CM

## 2013-07-08 DIAGNOSIS — R509 Fever, unspecified: Secondary | ICD-10-CM

## 2013-07-08 LAB — CBC
HCT: 45.2 % (ref 39.0–52.0)
HEMOGLOBIN: 15.3 g/dL (ref 13.0–17.0)
MCHC: 33.9 g/dL (ref 30.0–36.0)
MCV: 92.5 fl (ref 78.0–100.0)
PLATELETS: 202 10*3/uL (ref 150.0–400.0)
RBC: 4.88 Mil/uL (ref 4.22–5.81)
RDW: 13.6 % (ref 11.5–14.6)
WBC: 15.4 10*3/uL — AB (ref 4.5–10.5)

## 2013-07-08 LAB — COMPREHENSIVE METABOLIC PANEL
ALBUMIN: 4.1 g/dL (ref 3.5–5.2)
ALT: 49 U/L (ref 0–53)
AST: 48 U/L — AB (ref 0–37)
Alkaline Phosphatase: 57 U/L (ref 39–117)
BUN: 15 mg/dL (ref 6–23)
CALCIUM: 9 mg/dL (ref 8.4–10.5)
CHLORIDE: 100 meq/L (ref 96–112)
CO2: 27 meq/L (ref 19–32)
CREATININE: 1 mg/dL (ref 0.4–1.5)
GFR: 76.73 mL/min (ref >60.00)
Glucose, Bld: 110 mg/dL — ABNORMAL HIGH (ref 70–99)
POTASSIUM: 4 meq/L (ref 3.5–5.1)
Sodium: 135 mEq/L (ref 135–145)
Total Bilirubin: 1.1 mg/dL (ref 0.3–1.2)
Total Protein: 8 g/dL (ref 6.0–8.3)

## 2013-07-08 MED ORDER — LEVOFLOXACIN 500 MG PO TABS: 500.0000 mg | ORAL_TABLET | Freq: Every day | ORAL | Status: DC

## 2013-07-08 NOTE — Telephone Encounter (Signed)
Spoke to patient's wife and she is aware of results and instructions as indicated in result note

## 2013-07-08 NOTE — Patient Instructions (Signed)
Fever, Adult A fever is a higher than normal body temperature. In an adult, an oral temperature around 98.6 F (37 C) is considered normal. A temperature of 100.4 F (38 C) or higher is generally considered a fever. Mild or moderate fevers generally have no long-term effects and often do not require treatment. Extreme fever (greater than or equal to 106 F or 41.1 C) can cause seizures. The sweating that may occur with repeated or prolonged fever may cause dehydration. Elderly people can develop confusion during a fever. A measured temperature can vary with:  Age.  Time of day.  Method of measurement (mouth, underarm, rectal, or ear). The fever is confirmed by taking a temperature with a thermometer. Temperatures can be taken different ways. Some methods are accurate and some are not.  An oral temperature is used most commonly. Electronic thermometers are fast and accurate.  An ear temperature will only be accurate if the thermometer is positioned as recommended by the manufacturer.  A rectal temperature is accurate and done for those adults who have a condition where an oral temperature cannot be taken.  An underarm (axillary) temperature is not accurate and not recommended. Fever is a symptom, not a disease.  CAUSES   Infections commonly cause fever.  Some noninfectious causes for fever include:  Some arthritis conditions.  Some thyroid or adrenal gland conditions.  Some immune system conditions.  Some types of cancer.  A medicine reaction.  High doses of certain street drugs such as methamphetamine.  Dehydration.  Exposure to high outside or room temperatures.  Occasionally, the source of a fever cannot be determined. This is sometimes called a "fever of unknown origin" (FUO).  Some situations may lead to a temporary rise in body temperature that may go away on its own. Examples are:  Childbirth.  Surgery.  Intense exercise. HOME CARE INSTRUCTIONS   Take  appropriate medicines for fever. Follow dosing instructions carefully. If you use acetaminophen to reduce the fever, be careful to avoid taking other medicines that also contain acetaminophen. Do not take aspirin for a fever if you are younger than age 19. There is an association with Reye's syndrome. Reye's syndrome is a rare but potentially deadly disease.  If an infection is present and antibiotics have been prescribed, take them as directed. Finish them even if you start to feel better.  Rest as needed.  Maintain an adequate fluid intake. To prevent dehydration during an illness with prolonged or recurrent fever, you may need to drink extra fluid.Drink enough fluids to keep your urine clear or pale yellow.  Sponging or bathing with room temperature water may help reduce body temperature. Do not use ice water or alcohol sponge baths.  Dress comfortably, but do not over-bundle. SEEK MEDICAL CARE IF:   You are unable to keep fluids down.  You develop vomiting or diarrhea.  You are not feeling at least partly better after 3 days.  You develop new symptoms or problems. SEEK IMMEDIATE MEDICAL CARE IF:   You have shortness of breath or trouble breathing.  You develop excessive weakness.  You are dizzy or you faint.  You are extremely thirsty or you are making little or no urine.  You develop new pain that was not there before (such as in the head, neck, chest, back, or abdomen).  You have persistant vomiting and diarrhea for more than 1 to 2 days.  You develop a stiff neck or your eyes become sensitive to light.  You develop a   skin rash.  You have a fever or persistent symptoms for more than 2 to 3 days.  You have a fever and your symptoms suddenly get worse. MAKE SURE YOU:   Understand these instructions.  Will watch your condition.  Will get help right away if you are not doing well or get worse. Document Released: 12/03/2000 Document Revised: 09/01/2011 Document  Reviewed: 04/10/2011 [employer] Patient Information 2014 [employer].  

## 2013-07-08 NOTE — Progress Notes (Signed)
Pre-visit discussion using our clinic review tool. No additional management support is needed unless otherwise documented below in the visit note.  

## 2013-07-08 NOTE — Progress Notes (Signed)
HPI  Pt presents to the clinic today with c/o cough, body aches, fever and shortness of breath. The cough started 5 days ago but all the other symptoms "hit me like a train last night". The cough is unproductive. He has had chills and body aches. He has had some nausea but denies vomiting or diarrhea. He has taken cough syrup with codeine. He has had sick contacts. He did get his flu shot. He has no history of asthma or allergies.  Review of Systems      Past Medical History  Diagnosis Date  . Hepatitis C     Rx interferon/ribaviron 2004-'05 (recurred after Rx)  . GERD (gastroesophageal reflux disease)   . Hypertension     Family History  Problem Relation Age of Onset  . Colon cancer Maternal Aunt   . Diabetes Mother   . Heart disease Father   . Breast cancer Sister   . Hypertension Neg Hx   . Cancer Neg Hx     colon or prostate cancer    History   Social History  . Marital Status: Married    Spouse Name: N/A    Number of Children: 3  . Years of Education: N/A   Occupational History  . Futures trader, has farm    Social History Main Topics  . Smoking status: Current Every Day Smoker -- 0.50 packs/day    Types: Cigarettes  . Smokeless tobacco: Never Used  . Alcohol Use: 0.6 oz/week    1 Cans of beer per week  . Drug Use: No  . Sexual Activity: Not on file   Other Topics Concern  . Not on file   Social History Narrative  . No narrative on file    Allergies  Allergen Reactions  . Lisinopril     Uvula swelling---?angioedema     Constitutional: Positive headache, fatigue and fever. Denies abrupt weight changes.  HEENT:  Positive sore throat. Denies eye redness, eye pain, pressure behind the eyes, facial pain, nasal congestion, ear pain, ringing in the ears, wax buildup, runny nose or bloody nose. Respiratory: Positive cough. Denies difficulty breathing or shortness of breath.  Cardiovascular: Denies chest pain, chest tightness, palpitations or  swelling in the hands or feet.   No other specific complaints in a complete review of systems (except as listed in HPI above).  Objective:   BP 122/80  Pulse 100  Temp(Src) 101.2 F (38.4 C) (Oral)  Wt 198 lb 8 oz (90.039 kg)  SpO2 97% Wt Readings from Last 3 Encounters:  07/08/13 198 lb 8 oz (90.039 kg)  05/04/13 201 lb (91.173 kg)  10/21/12 207 lb (93.895 kg)     General: Appears his stated age, ill appearing but in NAD. HEENT: Head: normal shape and size; Eyes: sclera white, no icterus, conjunctiva pink, PERRLA and EOMs intact; Ears: Tm's gray and intact, normal light reflex; Nose: mucosa pink and moist, septum midline; Throat/Mouth: + PND. Teeth present, mucosa erythematous and moist, no exudate noted, no lesions or ulcerations noted.  Neck: Mild cervical lymphadenopathy. Neck supple, trachea midline. No massses, lumps or thyromegaly present.  Cardiovascular: Normal rate and rhythm. S1,S2 noted.  No murmur, rubs or gallops noted. No JVD or BLE edema. No carotid bruits noted. Pulmonary/Chest: Increased effort and diminshed breath sounds. No respiratory distress. No wheezes, rales or ronchi noted.      Assessment & Plan:   Fever, cough, chills, nausea:  Rapid Flu-negative Get some rest and drink plenty of water Will obtain  chest xray to r/o pneumonia CBC and CMET eRx for Levaquin daily x 7 days  Will call you with the results, if any worse, go to ER immediately

## 2013-08-02 ENCOUNTER — Telehealth: Payer: Self-pay

## 2013-08-02 NOTE — Telephone Encounter (Signed)
Pt left v/m; pt was seen few weeks ago and pt is over flu; pt has hep C and wants to know if Dr Silvio Pate thinks pt needs to come back in for f/u on lab test to see if WBC has gone back down.Please advise.

## 2013-08-03 NOTE — Telephone Encounter (Signed)
Left message on machine with results, advised to call if any questions

## 2013-08-03 NOTE — Telephone Encounter (Signed)
I don't think so The elevated white count is expected with that illness so I don't expect it has anything to do with his liver

## 2013-08-10 ENCOUNTER — Telehealth: Payer: Self-pay

## 2013-08-10 NOTE — Telephone Encounter (Signed)
Pt said 15 yrs ago diagnosed with Hep C and in past (?10 years received treatment) pt wants to discuss with Dr Silvio Pate about Hep C treatment; pt said he does not feel right and wants to talk with Dr Alphonzo Lemmings does not want to discuss further with me). Pt scheduled 08/11/13 at 2:15 with Dr Silvio Pate.

## 2013-08-11 ENCOUNTER — Encounter: Payer: Self-pay | Admitting: Internal Medicine

## 2013-08-11 ENCOUNTER — Ambulatory Visit (INDEPENDENT_AMBULATORY_CARE_PROVIDER_SITE_OTHER): Payer: BC Managed Care – PPO | Admitting: Internal Medicine

## 2013-08-11 VITALS — BP 138/70 | HR 74 | Temp 98.0°F | Wt 204.0 lb

## 2013-08-11 DIAGNOSIS — B1710 Acute hepatitis C without hepatic coma: Secondary | ICD-10-CM

## 2013-08-11 NOTE — Progress Notes (Signed)
   Subjective:    Patient ID: Craig Calhoun, male    DOB: 06-03-1951, 63 y.o.   MRN: [id number]  HPI Has been concerned about "something" on his right side Not painful but is "discomfort" Notices it more when he is working---better when relaxing  This has been going on for 2 years-- seems to be slightly worse  Discomfort is not related to eating Bowels have been fine  Current Outpatient Prescriptions on File Prior to Visit  Medication Sig Dispense Refill  . omeprazole (PRILOSEC) 20 MG capsule Take 1 capsule (20 mg total) by mouth daily.  30 capsule  6  . triamterene-hydrochlorothiazide (MAXZIDE-25) 37.5-25 MG per tablet Take 1 tablet by mouth daily.  90 tablet  1   No current facility-administered medications on file prior to visit.    Allergies  Allergen Reactions  . Lisinopril     Uvula swelling---?angioedema    Past Medical History  Diagnosis Date  . Hepatitis C     Rx interferon/ribaviron 2004-'05 (recurred after Rx)  . GERD (gastroesophageal reflux disease)   . Hypertension     Past Surgical History  Procedure Laterality Date  . Appendectomy    . Lumbar laminectomy    . Colonoscopy    . Liver biopsy    . Meniscus repair Left 4/14    Family History  Problem Relation Age of Onset  . Colon cancer Maternal Aunt   . Diabetes Mother   . Heart disease Father   . Breast cancer Sister   . Hypertension Neg Hx   . Cancer Neg Hx     colon or prostate cancer    History   Social History  . Marital Status: Married    Spouse Name: N/A    Number of Children: 3  . Years of Education: N/A   Occupational History  . Futures trader, has farm    Social History Main Topics  . Smoking status: Current Every Day Smoker -- 0.50 packs/day    Types: Cigarettes  . Smokeless tobacco: Never Used  . Alcohol Use: 0.6 oz/week    1 Cans of beer per week  . Drug Use: No  . Sexual Activity: Not on file   Other Topics Concern  . Not on file   Social History Narrative    . No narrative on file   Review of Systems Appetite is fine Weight is stable Still with swelling feeling on forefeet-- going on for a while    Objective:   Physical Exam  Constitutional: He appears well-developed and well-nourished. No distress.  Pulmonary/Chest: Effort normal and breath sounds normal. No respiratory distress. He has no wheezes. He has no rales.  Abdominal: Soft. He exhibits no distension and no mass. There is no tenderness. There is no rebound and no guarding.  No liver enlargement          Assessment & Plan:

## 2013-08-11 NOTE — Telephone Encounter (Signed)
There are now good meds but cost hundred's of thousands of dollars Next step might be repeat liver biopsy to see if active inflammation

## 2013-08-11 NOTE — Assessment & Plan Note (Addendum)
Has mild discomfort in the RUQ ---doesn't seem to be liver or gallbladder related He is concerned about the hepatitis C Did have interferon/ribavarin over 10 years ago Liver tests have been stable--as well as liver synthetic function  No real indication for ultrasound Given effective Rx for hepatitis C--he wonders about looking into it again Will review with GI partner---consider repeat biopsy (did have high titers still last time I checked)  Reviewed the options with Dr Carlean Purl He notes a newer ultrasound that can be used instead of biopsy---along with labs-- to determine risk and whether Rx is appropriate Will refer to liver specialist from [place] that comes to [facility]

## 2013-08-11 NOTE — Progress Notes (Signed)
Pre visit review using our clinic review tool, if applicable. No additional management support is needed unless otherwise documented below in the visit note. 

## 2013-08-12 ENCOUNTER — Telehealth: Payer: Self-pay | Admitting: Internal Medicine

## 2013-08-12 NOTE — Addendum Note (Signed)
Addended by: Viviana Simpler I on: 08/12/2013 03:05 PM   Modules accepted: Orders

## 2013-08-12 NOTE — Telephone Encounter (Signed)
Relevant patient education assigned to patient using Emmi. ° °

## 2013-08-19 ENCOUNTER — Other Ambulatory Visit: Payer: Self-pay | Admitting: *Deleted

## 2013-08-19 MED ORDER — TRIAMTERENE-HCTZ 37.5-25 MG PO TABS: 1.0000 | ORAL_TABLET | Freq: Every day | ORAL | Status: DC

## 2013-09-21 ENCOUNTER — Other Ambulatory Visit: Payer: Self-pay | Admitting: Nurse Practitioner

## 2013-09-21 DIAGNOSIS — C220 Liver cell carcinoma: Secondary | ICD-10-CM

## 2013-09-26 ENCOUNTER — Other Ambulatory Visit: Payer: Self-pay | Admitting: *Deleted

## 2013-09-26 MED ORDER — OMEPRAZOLE 20 MG PO CPDR: 20.0000 mg | DELAYED_RELEASE_CAPSULE | Freq: Every day | ORAL | Status: DC

## 2013-09-29 ENCOUNTER — Ambulatory Visit
Admission: RE | Admit: 2013-09-29 | Discharge: 2013-09-29 | Disposition: A | Discharge status: Home / Self Care | Payer: BC Managed Care – PPO | Source: Ambulatory Visit | Attending: Nurse Practitioner | Admitting: Nurse Practitioner

## 2013-09-29 DIAGNOSIS — C220 Liver cell carcinoma: Secondary | ICD-10-CM

## 2013-10-04 ENCOUNTER — Telehealth: Payer: Self-pay | Admitting: *Deleted

## 2013-10-04 MED ORDER — HEPATITIS B VAC RECOMBINANT 20 MCG/ML IM INJ: 1.0000 mL | INJECTION | Freq: Once | INTRAMUSCULAR | Status: DC

## 2013-10-04 NOTE — Telephone Encounter (Signed)
rx sent to pharmacy by e-script Spoke with patient and advised results   

## 2013-10-04 NOTE — Telephone Encounter (Signed)
Patient called stating that he has received a letter from [place] him that he does not have an immunity to Hepatitis B. Patient states that he would like a script sent to [facility] to get the Hepatitis vaccines there. Patient stated that he does have immunity to Hepatitis A. Please let patient know when this has been done.

## 2013-10-04 NOTE — Telephone Encounter (Signed)
.  left message to have patient return my call.  

## 2013-10-04 NOTE — Telephone Encounter (Signed)
Send order there for the 3 set hep B vaccine series  We can do it here if he prefers

## 2013-11-04 ENCOUNTER — Telehealth: Payer: Self-pay | Admitting: Family Medicine

## 2013-11-04 NOTE — Telephone Encounter (Signed)
Spoke with patient and advised results   

## 2013-11-04 NOTE — Telephone Encounter (Signed)
There are no problems with that medicine and his current ones. This is the medicine I expected them to try and has had great success Tell him good luck!

## 2013-11-04 NOTE — Telephone Encounter (Signed)
Pt was prescribed a new RX by his Hepatologist, Dr. Arrie Aran ?, called Harvoni.  He wants to make sure it is ok to start this medication with his Omeprazole and Maxide.  Cb [phone]

## 2013-11-07 ENCOUNTER — Ambulatory Visit (INDEPENDENT_AMBULATORY_CARE_PROVIDER_SITE_OTHER): Payer: BC Managed Care – PPO | Admitting: Internal Medicine

## 2013-11-07 ENCOUNTER — Encounter: Payer: Self-pay | Admitting: Internal Medicine

## 2013-11-07 ENCOUNTER — Telehealth: Payer: Self-pay | Admitting: Internal Medicine

## 2013-11-07 VITALS — BP 120/70 | HR 64 | Temp 98.3°F | Ht 73.0 in | Wt 201.0 lb

## 2013-11-07 DIAGNOSIS — Z23 Encounter for immunization: Secondary | ICD-10-CM

## 2013-11-07 DIAGNOSIS — I10 Essential (primary) hypertension: Secondary | ICD-10-CM

## 2013-11-07 DIAGNOSIS — Z125 Encounter for screening for malignant neoplasm of prostate: Secondary | ICD-10-CM

## 2013-11-07 DIAGNOSIS — B1710 Acute hepatitis C without hepatic coma: Secondary | ICD-10-CM

## 2013-11-07 DIAGNOSIS — K219 Gastro-esophageal reflux disease without esophagitis: Secondary | ICD-10-CM

## 2013-11-07 DIAGNOSIS — Z0000 Encounter for general adult medical examination without abnormal findings: Secondary | ICD-10-CM

## 2013-11-07 LAB — PSA: PSA: 0.32 ng/mL (ref 0.10–4.00)

## 2013-11-07 NOTE — Assessment & Plan Note (Signed)
Will update with Tdap PSA after discussion

## 2013-11-07 NOTE — Assessment & Plan Note (Signed)
Getting Rx

## 2013-11-07 NOTE — Progress Notes (Signed)
Subjective:    Patient ID: Craig Calhoun, male    DOB: 26-Sep-1950, 64 y.o.   MRN: [id number]  HPI Here for physical Now on combination therapy for the hepatitis C Will need 6 months due to early cirrhosis  Still smoking  Has cut down but not off yet Counseled  Stopped the omeprazole due to possible problem with the harvoni Stomach has been okay  Has a knot on RLQ over the past few weeks At spot of appendectomy No pain  Current Outpatient Prescriptions on File Prior to Visit  Medication Sig Dispense Refill  . triamterene-hydrochlorothiazide (MAXZIDE-25) 37.5-25 MG per tablet Take 1 tablet by mouth daily.  90 tablet  1   No current facility-administered medications on file prior to visit.    Allergies  Allergen Reactions  . Lisinopril     Uvula swelling---?angioedema    Past Medical History  Diagnosis Date  . Hepatitis C     Rx interferon/ribaviron 2004-'05 (recurred after Rx)  . GERD (gastroesophageal reflux disease)   . Hypertension     Past Surgical History  Procedure Laterality Date  . Appendectomy    . Lumbar laminectomy    . Colonoscopy    . Liver biopsy    . Meniscus repair Left 4/14    Family History  Problem Relation Age of Onset  . Colon cancer Maternal Aunt   . Diabetes Mother   . Heart disease Father   . Breast cancer Sister   . Hypertension Neg Hx   . Cancer Neg Hx     colon or prostate cancer    History   Social History  . Marital Status: Married    Spouse Name: N/A    Number of Children: 3  . Years of Education: N/A   Occupational History  . Futures trader, has farm     part time now   Social History Main Topics  . Smoking status: Current Every Day Smoker -- 0.50 packs/day    Types: Cigarettes  . Smokeless tobacco: Never Used  . Alcohol Use: 0.6 oz/week    1 Cans of beer per week  . Drug Use: No  . Sexual Activity: Not on file   Other Topics Concern  . Not on file   Social History Narrative  . No narrative on  file   Review of Systems  Constitutional: Negative for fatigue and unexpected weight change.       Wears seat belt  HENT: Positive for tinnitus. Negative for dental problem and hearing loss.        Regular weith dentist  Eyes: Negative for visual disturbance.       No diplopia or unilateral vision loss  Respiratory: Negative for cough, chest tightness and shortness of breath.   Cardiovascular: Negative for chest pain, palpitations and leg swelling.       Does occasionally feel something "different" with heart  Gastrointestinal: Negative for nausea, vomiting, abdominal pain, constipation and blood in stool.  Endocrine: Negative for cold intolerance and heat intolerance.  Genitourinary: Negative for urgency, frequency and difficulty urinating.       Nocturia x 1-2  No sexual problems  Musculoskeletal: Positive for arthralgias. Negative for back pain and joint swelling.       Some forefoot swelling still Mild pain in fingers--no meds  Skin: Negative for rash.       Has mole on left leg to be checked  Allergic/Immunologic: Negative for environmental allergies and immunocompromised state.  Neurological: Negative for dizziness,  syncope, weakness, light-headedness, numbness and headaches.  Hematological: Negative for adenopathy. Does not bruise/bleed easily.  Psychiatric/Behavioral: Negative for sleep disturbance and dysphoric mood. The patient is not nervous/anxious.        Objective:   Physical Exam  Constitutional: He is oriented to person, place, and time. He appears well-developed and well-nourished. No distress.  HENT:  Head: Normocephalic and atraumatic.  Right Ear: External ear normal.  Left Ear: External ear normal.  Mouth/Throat: Oropharynx is clear and moist. No oropharyngeal exudate.  Eyes: Conjunctivae and EOM are normal. Pupils are equal, round, and reactive to light.  Left pupil slightly larger?  Neck: Normal range of motion. Neck supple. No thyromegaly present.    Cardiovascular: Normal rate, regular rhythm, normal heart sounds and intact distal pulses.  Exam reveals no gallop.   No murmur heard. Bigeminy at times  Pulmonary/Chest: Effort normal and breath sounds normal. No respiratory distress. He has no wheezes. He has no rales.  Abdominal: Soft. He exhibits no distension. There is no tenderness. There is no rebound and no guarding.  Slight bulge at inferior portion of appendectomy scar--doesn't seem to be hernia  Musculoskeletal: He exhibits no edema and no tenderness.  Lymphadenopathy:    He has no cervical adenopathy.  Neurological: He is alert and oriented to person, place, and time.  Skin: No rash noted. No erythema.  seb keratosis on upper left thigh  Psychiatric: He has a normal mood and affect. His behavior is normal.          Assessment & Plan:

## 2013-11-07 NOTE — Progress Notes (Signed)
Pre visit review using our clinic review tool, if applicable. No additional management support is needed unless otherwise documented below in the visit note. 

## 2013-11-07 NOTE — Assessment & Plan Note (Signed)
Quiet Will change the omeprazole to prn since it may interact with harvoni

## 2013-11-07 NOTE — Telephone Encounter (Signed)
Relevant patient education assigned to patient using Emmi. ° °

## 2013-11-07 NOTE — Assessment & Plan Note (Signed)
BP Readings from Last 3 Encounters:  11/07/13 120/70  08/11/13 138/70  07/08/13 122/80   Lab Results  Component Value Date   CREATININE 1.0 07/08/2013   Doing well No changes

## 2013-11-07 NOTE — Patient Instructions (Signed)
Please call 1-800 QUIT NOW for help to stop smoking completely.

## 2013-11-08 NOTE — Addendum Note (Signed)
Addended by: Despina Hidden on: 11/08/2013 09:58 AM   Modules accepted: Orders

## 2014-02-06 ENCOUNTER — Other Ambulatory Visit: Payer: Self-pay | Admitting: Internal Medicine

## 2014-03-08 ENCOUNTER — Other Ambulatory Visit: Payer: Self-pay | Admitting: Internal Medicine

## 2014-04-07 ENCOUNTER — Other Ambulatory Visit: Payer: Self-pay

## 2014-05-29 ENCOUNTER — Other Ambulatory Visit: Payer: Self-pay | Admitting: Nurse Practitioner

## 2014-05-29 DIAGNOSIS — B182 Chronic viral hepatitis C: Secondary | ICD-10-CM

## 2014-05-29 DIAGNOSIS — K7469 Other cirrhosis of liver: Secondary | ICD-10-CM

## 2014-06-05 ENCOUNTER — Ambulatory Visit
Admission: RE | Admit: 2014-06-05 | Discharge: 2014-06-05 | Disposition: A | Discharge status: Home / Self Care | Payer: BC Managed Care – PPO | Source: Ambulatory Visit | Attending: Nurse Practitioner | Admitting: Nurse Practitioner

## 2014-06-05 ENCOUNTER — Other Ambulatory Visit: Payer: Self-pay | Admitting: Nurse Practitioner

## 2014-06-05 DIAGNOSIS — K7469 Other cirrhosis of liver: Secondary | ICD-10-CM

## 2014-06-05 DIAGNOSIS — B182 Chronic viral hepatitis C: Secondary | ICD-10-CM

## 2014-10-03 ENCOUNTER — Other Ambulatory Visit: Payer: Self-pay | Admitting: Internal Medicine

## 2014-10-03 ENCOUNTER — Other Ambulatory Visit: Payer: Self-pay | Admitting: Family Medicine

## 2014-10-18 ENCOUNTER — Other Ambulatory Visit (HOSPITAL_COMMUNITY): Payer: Self-pay | Admitting: Nurse Practitioner

## 2014-10-18 DIAGNOSIS — B182 Chronic viral hepatitis C: Secondary | ICD-10-CM

## 2014-11-02 ENCOUNTER — Other Ambulatory Visit: Payer: Self-pay | Admitting: Internal Medicine

## 2014-11-08 ENCOUNTER — Telehealth (HOSPITAL_COMMUNITY): Payer: Self-pay

## 2014-11-08 NOTE — Telephone Encounter (Signed)
Called to remind pt of 7am appt on 11/09/14.. Pt agreed to have nothing to eat or drink 6 hrs prior to exam. AW

## 2014-11-09 ENCOUNTER — Ambulatory Visit (HOSPITAL_COMMUNITY)
Admission: RE | Admit: 2014-11-09 | Discharge: 2014-11-09 | Disposition: A | Discharge status: Home / Self Care | Payer: BLUE CROSS/BLUE SHIELD | Source: Ambulatory Visit | Attending: Nurse Practitioner | Admitting: Nurse Practitioner

## 2014-11-09 DIAGNOSIS — K7460 Unspecified cirrhosis of liver: Secondary | ICD-10-CM | POA: Diagnosis not present

## 2014-11-09 DIAGNOSIS — I959 Hypotension, unspecified: Secondary | ICD-10-CM | POA: Diagnosis not present

## 2014-11-09 DIAGNOSIS — B182 Chronic viral hepatitis C: Secondary | ICD-10-CM | POA: Diagnosis present

## 2014-11-24 ENCOUNTER — Other Ambulatory Visit: Payer: Self-pay | Admitting: Internal Medicine

## 2015-05-08 ENCOUNTER — Other Ambulatory Visit: Payer: Self-pay | Admitting: Nurse Practitioner

## 2015-05-08 DIAGNOSIS — C220 Liver cell carcinoma: Secondary | ICD-10-CM

## 2015-05-16 ENCOUNTER — Ambulatory Visit
Admission: RE | Admit: 2015-05-16 | Discharge: 2015-05-16 | Disposition: A | Discharge status: Home / Self Care | Payer: BLUE CROSS/BLUE SHIELD | Source: Ambulatory Visit | Attending: Nurse Practitioner | Admitting: Nurse Practitioner

## 2015-05-16 DIAGNOSIS — C220 Liver cell carcinoma: Secondary | ICD-10-CM

## 2015-05-25 ENCOUNTER — Other Ambulatory Visit: Payer: Self-pay | Admitting: Nurse Practitioner

## 2015-05-25 DIAGNOSIS — K769 Liver disease, unspecified: Secondary | ICD-10-CM

## 2015-05-29 ENCOUNTER — Ambulatory Visit
Admission: RE | Admit: 2015-05-29 | Discharge: 2015-05-29 | Disposition: A | Discharge status: Home / Self Care | Payer: BLUE CROSS/BLUE SHIELD | Source: Ambulatory Visit | Attending: Nurse Practitioner | Admitting: Nurse Practitioner

## 2015-05-29 DIAGNOSIS — K769 Liver disease, unspecified: Secondary | ICD-10-CM

## 2015-05-29 MED ORDER — GADOXETATE DISODIUM 0.25 MMOL/ML IV SOLN
9.0000 mL | Freq: Once | INTRAVENOUS | Status: AC | PRN
  Administered 2015-05-29: 9 mL via INTRAVENOUS

## 2015-08-31 ENCOUNTER — Encounter: Payer: Self-pay | Admitting: Internal Medicine

## 2015-08-31 ENCOUNTER — Ambulatory Visit (INDEPENDENT_AMBULATORY_CARE_PROVIDER_SITE_OTHER): Payer: BLUE CROSS/BLUE SHIELD | Admitting: Internal Medicine

## 2015-08-31 VITALS — BP 142/84 | HR 67 | Temp 98.2°F | Ht 72.25 in | Wt 207.0 lb

## 2015-08-31 DIAGNOSIS — K7460 Unspecified cirrhosis of liver: Secondary | ICD-10-CM | POA: Diagnosis not present

## 2015-08-31 DIAGNOSIS — Z0000 Encounter for general adult medical examination without abnormal findings: Secondary | ICD-10-CM | POA: Diagnosis not present

## 2015-08-31 DIAGNOSIS — I10 Essential (primary) hypertension: Secondary | ICD-10-CM | POA: Diagnosis not present

## 2015-08-31 DIAGNOSIS — K219 Gastro-esophageal reflux disease without esophagitis: Secondary | ICD-10-CM | POA: Diagnosis not present

## 2015-08-31 LAB — COMPREHENSIVE METABOLIC PANEL
ALT: 22 U/L (ref 0–53)
AST: 24 U/L (ref 0–37)
Albumin: 4.4 g/dL (ref 3.5–5.2)
Alkaline Phosphatase: 50 U/L (ref 39–117)
BUN: 26 mg/dL — AB (ref 6–23)
CHLORIDE: 103 meq/L (ref 96–112)
CO2: 31 meq/L (ref 19–32)
Calcium: 9.6 mg/dL (ref 8.4–10.5)
Creatinine, Ser: 0.97 mg/dL (ref 0.40–1.50)
GFR: 82.59 mL/min (ref >60.00)
GLUCOSE: 108 mg/dL — AB (ref 70–99)
Potassium: 4.2 mEq/L (ref 3.5–5.1)
SODIUM: 140 meq/L (ref 135–145)
Total Bilirubin: 0.5 mg/dL (ref 0.2–1.2)
Total Protein: 7.9 g/dL (ref 6.0–8.3)

## 2015-08-31 LAB — CBC WITH DIFFERENTIAL/PLATELET
BASOS PCT: 0.7 % (ref 0.0–3.0)
Basophils Absolute: 0.1 10*3/uL (ref 0.0–0.1)
EOS PCT: 3.3 % (ref 0.0–5.0)
Eosinophils Absolute: 0.3 10*3/uL (ref 0.0–0.7)
HCT: 43.3 % (ref 39.0–52.0)
Hemoglobin: 14.5 g/dL (ref 13.0–17.0)
LYMPHS ABS: 2.6 10*3/uL (ref 0.7–4.0)
Lymphocytes Relative: 31.4 % (ref 12.0–46.0)
MCHC: 33.5 g/dL (ref 30.0–36.0)
MCV: 91.8 fl (ref 78.0–100.0)
MONOS PCT: 9.5 % (ref 3.0–12.0)
Monocytes Absolute: 0.8 10*3/uL (ref 0.1–1.0)
NEUTROS PCT: 55.1 % (ref 43.0–77.0)
Neutro Abs: 4.5 10*3/uL (ref 1.4–7.7)
Platelets: 238 10*3/uL (ref 150.0–400.0)
RBC: 4.72 Mil/uL (ref 4.22–5.81)
RDW: 14.5 % (ref 11.5–15.5)
WBC: 8.2 10*3/uL (ref 4.0–10.5)

## 2015-08-31 NOTE — Addendum Note (Signed)
Addended by: Daralene Milch C on: 08/31/2015 01:29 PM   Modules accepted: Miquel Dunn

## 2015-08-31 NOTE — Progress Notes (Signed)
Subjective:    Patient ID: Craig Calhoun, male    DOB: 31-Oct-1950, 65 y.o.   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 No AM grogginess Discussed melatonin No tired during the day  Current Outpatient Prescriptions on File Prior to Visit  Medication Sig Dispense Refill  . omeprazole (PRILOSEC) 20 MG capsule TAKE ONE (1) CAPSULE EACH DAY 90 capsule 3  . triamterene-hydrochlorothiazide (MAXZIDE-25) 37.5-25 MG per tablet TAKE ONE (1) TABLET EACH DAY 90 tablet 3   No current facility-administered medications on file prior to visit.    Allergies  Allergen Reactions  . Lisinopril     Uvula swelling---?angioedema    Past Medical History  Diagnosis Date  . Hepatitis C     Rx interferon/ribaviron 2004-'05 (recurred after Rx)  . GERD (gastroesophageal reflux disease)   . Hypertension     Past Surgical History  Procedure Laterality Date  . Appendectomy    . Lumbar laminectomy    . Colonoscopy    . Liver biopsy    . Meniscus repair Left 4/14    Family History  Problem Relation Age of Onset  . Colon cancer Maternal Aunt   . Diabetes Mother   . Heart disease Father   . Breast cancer Sister   . Hypertension Neg Hx   . Cancer Neg Hx     colon or prostate cancer    Social History   Social History  . Marital Status: Married    Spouse Name: N/A  . Number of Children: 3  . Years of Education: N/A   Occupational History  . Futures trader, has farm      part time now   Social History Main Topics  . Smoking status: Former Smoker -- 0.50 packs/day    Types: Cigarettes    Quit date: 07/16/2015  . Smokeless tobacco: Never Used  . Alcohol Use: 0.6 oz/week    1 Cans of beer per week  . Drug Use: No  . Sexual Activity: Not on file   Other Topics Concern  . Not on file   Social History Narrative   Review of Systems  Constitutional: Negative for fatigue and unexpected weight change.       Wears seat belt  HENT: Positive for hearing loss. Negative for dental problem and trouble swallowing.        Wife notes hearing loss---discussed audiology if significant Keeps up with dentist  Eyes: Negative for visual disturbance.       No diplopia or unilateral vision loss occ spots  Respiratory: Negative for cough, chest tightness and shortness of breath.   Cardiovascular: Negative for chest pain, palpitations and leg swelling.  Gastrointestinal: Negative for nausea, abdominal pain, constipation and blood in stool.       Heartburn controlled with med  Endocrine: Negative for polydipsia and polyuria.  Genitourinary: Positive for frequency. Negative for difficulty urinating.       Nocturia x 2 No sexual problems  Musculoskeletal: Positive for back pain. Negative for  arthralgias.  Skin: Negative for rash.       Did see a dermatologist-- getting lesion on right arm removed (precancerous). Dr Phillip Heal  Allergic/Immunologic: Negative for environmental allergies and immunocompromised state.  Neurological: Negative for syncope, weakness and headaches.       Single dizzy spell about a month ago--- 5 seconds only  Hematological: Negative for adenopathy. Does not bruise/bleed easily.  Psychiatric/Behavioral: Positive for sleep disturbance. Negative for dysphoric mood. The patient is not nervous/anxious.        Objective:   Physical Exam  Constitutional: He is oriented to person, place, and time. He appears well-developed and well-nourished. No  distress.  HENT:  Head: Normocephalic and atraumatic.  Right Ear: External ear normal.  Left Ear: External ear normal.  Mouth/Throat: Oropharynx is clear and moist. No oropharyngeal exudate.  Eyes: Conjunctivae are normal. Pupils are equal, round, and reactive to light.  Neck: Normal range of motion. Neck supple. No thyromegaly present.  No tenderness  Cardiovascular: Normal rate, regular rhythm, normal heart sounds and intact distal pulses.  Exam reveals no gallop.   No murmur heard. Pulmonary/Chest: Effort normal and breath sounds normal. No respiratory distress. He has no wheezes. He has no rales.  Abdominal: Soft. There is no tenderness.  Musculoskeletal: He exhibits no edema or tenderness.  Lymphadenopathy:    He has no cervical adenopathy.  Neurological: He is alert and oriented to person, place, and time.  Skin: No rash noted. No erythema.  Psychiatric: He has a normal mood and affect. His behavior is normal.          Assessment & Plan:

## 2015-08-31 NOTE — Assessment & Plan Note (Signed)
BP Readings from Last 3 Encounters:  08/31/15 142/84  11/07/13 120/70  08/11/13 138/70   Generally fine No change for now

## 2015-08-31 NOTE — Patient Instructions (Signed)
You can try over the counter melatonin to help sleep--- start at 3mg  and you can work up to as much as 10mg .

## 2015-08-31 NOTE — Assessment & Plan Note (Signed)
Controlled with PPI

## 2015-08-31 NOTE — Assessment & Plan Note (Signed)
Follows at hep C clinic from [place] They keep up with ultrasounds

## 2015-08-31 NOTE — Assessment & Plan Note (Signed)
Generally healthy Will defer PSA to at least next year Colon due next month--he should be notified

## 2015-08-31 NOTE — Progress Notes (Signed)
Pre visit review using our clinic review tool, if applicable. No additional management support is needed unless otherwise documented below in the visit note. 

## 2015-09-07 ENCOUNTER — Encounter: Payer: Self-pay | Admitting: Internal Medicine

## 2015-10-01 DIAGNOSIS — H00014 Hordeolum externum left upper eyelid: Secondary | ICD-10-CM | POA: Diagnosis not present

## 2015-10-01 DIAGNOSIS — S0502XA Injury of conjunctiva and corneal abrasion without foreign body, left eye, initial encounter: Secondary | ICD-10-CM | POA: Diagnosis not present

## 2015-10-01 DIAGNOSIS — T1512XA Foreign body in conjunctival sac, left eye, initial encounter: Secondary | ICD-10-CM | POA: Diagnosis not present

## 2015-10-02 DIAGNOSIS — T1512XD Foreign body in conjunctival sac, left eye, subsequent encounter: Secondary | ICD-10-CM | POA: Diagnosis not present

## 2015-10-02 DIAGNOSIS — S0502XD Injury of conjunctiva and corneal abrasion without foreign body, left eye, subsequent encounter: Secondary | ICD-10-CM | POA: Diagnosis not present

## 2015-10-04 ENCOUNTER — Encounter: Payer: Self-pay | Admitting: Internal Medicine

## 2015-10-18 DIAGNOSIS — D485 Neoplasm of uncertain behavior of skin: Secondary | ICD-10-CM | POA: Diagnosis not present

## 2015-10-18 DIAGNOSIS — D2261 Melanocytic nevi of right upper limb, including shoulder: Secondary | ICD-10-CM | POA: Diagnosis not present

## 2015-10-22 DIAGNOSIS — K7469 Other cirrhosis of liver: Secondary | ICD-10-CM | POA: Diagnosis not present

## 2015-11-05 ENCOUNTER — Other Ambulatory Visit: Payer: Self-pay | Admitting: Nurse Practitioner

## 2015-11-05 DIAGNOSIS — K7469 Other cirrhosis of liver: Secondary | ICD-10-CM

## 2015-11-18 ENCOUNTER — Ambulatory Visit
Admission: RE | Admit: 2015-11-18 | Discharge: 2015-11-18 | Disposition: A | Discharge status: Home / Self Care | Payer: PPO | Source: Ambulatory Visit | Attending: Nurse Practitioner | Admitting: Nurse Practitioner

## 2015-11-18 DIAGNOSIS — K7469 Other cirrhosis of liver: Secondary | ICD-10-CM

## 2015-11-18 DIAGNOSIS — K7460 Unspecified cirrhosis of liver: Secondary | ICD-10-CM | POA: Diagnosis not present

## 2015-11-18 MED ORDER — GADOXETATE DISODIUM 0.25 MMOL/ML IV SOLN
9.0000 mL | Freq: Once | INTRAVENOUS | Status: AC | PRN
  Administered 2015-11-18: 9 mL via INTRAVENOUS

## 2015-11-27 ENCOUNTER — Other Ambulatory Visit: Payer: Self-pay | Admitting: Internal Medicine

## 2015-12-12 ENCOUNTER — Encounter: Payer: BLUE CROSS/BLUE SHIELD | Admitting: Internal Medicine

## 2016-01-07 ENCOUNTER — Other Ambulatory Visit: Payer: Self-pay | Admitting: Internal Medicine

## 2016-01-21 ENCOUNTER — Encounter: Payer: PPO | Admitting: Internal Medicine

## 2016-03-26 ENCOUNTER — Encounter: Payer: Self-pay | Admitting: General Surgery

## 2016-04-14 ENCOUNTER — Encounter: Payer: Self-pay | Admitting: *Deleted

## 2016-04-15 ENCOUNTER — Ambulatory Visit: Payer: Self-pay | Admitting: General Surgery

## 2016-04-21 ENCOUNTER — Ambulatory Visit (INDEPENDENT_AMBULATORY_CARE_PROVIDER_SITE_OTHER): Payer: PPO | Admitting: General Surgery

## 2016-04-21 ENCOUNTER — Encounter: Payer: Self-pay | Admitting: General Surgery

## 2016-04-21 VITALS — BP 130/80 | HR 78 | Resp 12 | Ht 74.0 in | Wt 210.0 lb

## 2016-04-21 DIAGNOSIS — Z86010 Personal history of colonic polyps: Secondary | ICD-10-CM | POA: Diagnosis not present

## 2016-04-21 MED ORDER — POLYETHYLENE GLYCOL 3350 17 GM/SCOOP PO POWD: 1.0000 | Freq: Once | ORAL | 0 refills | Status: AC

## 2016-04-21 NOTE — Patient Instructions (Signed)
Colonoscopy A colonoscopy is an exam to look at the entire large intestine (colon). This exam can help find problems such as tumors, polyps, inflammation, and areas of bleeding. The exam takes about 1 hour.  LET YOUR HEALTH CARE PROVIDER KNOW ABOUT:   Any allergies you have.  All medicines you are taking, including vitamins, herbs, eye drops, creams, and over-the-counter medicines.  Previous problems you or members of your family have had with the use of anesthetics.  Any blood disorders you have.  Previous surgeries you have had.  Medical conditions you have. RISKS AND COMPLICATIONS  Generally, this is a safe procedure. However, as with any procedure, complications can occur. Possible complications include:  Bleeding.  Tearing or rupture of the colon wall.  Reaction to medicines given during the exam.  Infection (rare). BEFORE THE PROCEDURE   Ask your health care provider about changing or stopping your regular medicines.  You may be prescribed an oral bowel prep. This involves drinking a large amount of medicated liquid, starting the day before your procedure. The liquid will cause you to have multiple loose stools until your stool is almost clear or light green. This cleans out your colon in preparation for the procedure.  Do not eat or drink anything else once you have started the bowel prep, unless your health care provider tells you it is safe to do so.  Arrange for someone to drive you home after the procedure. PROCEDURE   You will be given medicine to help you relax (sedative).  You will lie on your side with your knees bent.  A long, flexible tube with a light and camera on the end (colonoscope) will be inserted through the rectum and into the colon. The camera sends video back to a computer screen as it moves through the colon. The colonoscope also releases carbon dioxide gas to inflate the colon. This helps your health care provider see the area better.  During  the exam, your health care provider may take a small tissue sample (biopsy) to be examined under a microscope if any abnormalities are found.  The exam is finished when the entire colon has been viewed. AFTER THE PROCEDURE   Do not drive for 24 hours after the exam.  You may have a small amount of blood in your stool.  You may pass moderate amounts of gas and have mild abdominal cramping or bloating. This is caused by the gas used to inflate your colon during the exam.  Ask when your test results will be ready and how you will get your results. Make sure you get your test results.   This information is not intended to replace advice given to you by your health care provider. Make sure you discuss any questions you have with your health care provider.   Document Released: 06/06/2000 Document Revised: 03/30/2013 Document Reviewed: 02/14/2013 Elsevier Interactive Patient Education 2016 [employer]  

## 2016-04-21 NOTE — Progress Notes (Signed)
Patient ID: Craig Calhoun, male   DOB: 1951/03/18, 65 y.o.   MRN: [id number]  Chief Complaint  Patient presents with  . Colonoscopy    HPI Craig Calhoun is a 65 y.o. male here today for a evalaution of a colonoscopy. Last colonoscopy was done on 09/2010. He states no GI problems at this time.  HPI  Past Medical History:  Diagnosis Date  . GERD (gastroesophageal reflux disease)   . Hepatitis C    Rx interferon/ribaviron 2004-'05 (recurred after Rx), treated and cleared with Harvoni 2016  . Hypertension     Past Surgical History:  Procedure Laterality Date  . APPENDECTOMY    . COLONOSCOPY  09/2010   1 polyp-tubular adenoma  . LIVER BIOPSY    . LUMBAR LAMINECTOMY    . MENISCUS REPAIR Left 4/14    Family History  Problem Relation Age of Onset  . Diabetes Mother   . Heart disease Father   . Breast cancer Sister   . Colon cancer Maternal Aunt   . Hypertension Neg Hx   . Cancer Neg Hx     colon or prostate cancer    Social History Social History  Substance Use Topics  . Smoking status: Current Every Day Smoker    Packs/day: 0.50    Types: Cigarettes    Last attempt to quit: 07/16/2015  . Smokeless tobacco: Never Used  . Alcohol use 0.6 oz/week    1 Cans of beer per week    Allergies  Allergen Reactions  . Lisinopril     Uvula swelling---?angioedema    Current Outpatient Prescriptions  Medication Sig Dispense Refill  . omeprazole (PRILOSEC) 20 MG capsule TAKE ONE (1) CAPSULE EACH DAY 30 capsule 11  . triamterene-hydrochlorothiazide (MAXZIDE-25) 37.5-25 MG tablet TAKE ONE (1) TABLET EACH DAY 90 tablet 2   No current facility-administered medications for this visit.     Review of Systems Review of Systems  Constitutional: Negative.   Respiratory: Negative.   Cardiovascular: Negative.   Gastrointestinal: Negative.     Blood pressure 130/80, pulse 78, resp. rate 12, height 6\' 2"  (1.88 m), weight 210 lb (95.3 kg).  Physical Exam Physical Exam   Constitutional: He is oriented to person, place, and time. He appears well-developed and well-nourished.  Eyes: Conjunctivae are normal. No scleral icterus.  Neck: Neck supple.  Cardiovascular: Normal rate, regular rhythm and normal heart sounds.   Pulmonary/Chest: Effort normal and breath sounds normal.  Abdominal: Soft. Normal appearance and bowel sounds are normal. A hernia is present.    Small lipoma right lower quadrant   Lymphadenopathy:    He has no cervical adenopathy.  Neurological: He is alert and oriented to person, place, and time.  Skin: Skin is warm and dry.  Psychiatric: He has a normal mood and affect.    Data Reviewed 10/18/2010 descending colon polyp:  Diagnosis Surgical [P], descending colon TUBULAR ADENOMA (X1); NEGATIVE FOR HIGH GRADE DYSPLASIA OR MALIGNANCY.  Assessment    Past history colonic polyp, candidate for repeat exam.    Plan        Colonoscopy with possible biopsy/polypectomy prn: Information regarding the procedure, including its potential risks and complications (including but not limited to perforation of the bowel, which may require emergency surgery to repair, and bleeding) was verbally given to the patient. Educational information regarding lower intestinal endoscopy was given to the patient. Written instructions for how to complete the bowel prep using Miralax were provided. The importance of drinking ample fluids  to avoid dehydration as a result of the prep emphasized.  Patient has been scheduled for a colonoscopy on 05-02-16 at [facility].  This information has been scribed by Gaspar Cola CMA.    Robert Bellow 04/22/2016, 12:32 PM

## 2016-04-22 DIAGNOSIS — Z86010 Personal history of colonic polyps: Secondary | ICD-10-CM | POA: Insufficient documentation

## 2016-04-24 DIAGNOSIS — L570 Actinic keratosis: Secondary | ICD-10-CM | POA: Diagnosis not present

## 2016-04-24 DIAGNOSIS — L821 Other seborrheic keratosis: Secondary | ICD-10-CM | POA: Diagnosis not present

## 2016-04-24 DIAGNOSIS — Z872 Personal history of diseases of the skin and subcutaneous tissue: Secondary | ICD-10-CM | POA: Diagnosis not present

## 2016-04-24 DIAGNOSIS — Z1283 Encounter for screening for malignant neoplasm of skin: Secondary | ICD-10-CM | POA: Diagnosis not present

## 2016-04-24 DIAGNOSIS — B078 Other viral warts: Secondary | ICD-10-CM | POA: Diagnosis not present

## 2016-05-01 ENCOUNTER — Encounter: Payer: Self-pay | Admitting: *Deleted

## 2016-05-01 DIAGNOSIS — K7469 Other cirrhosis of liver: Secondary | ICD-10-CM | POA: Diagnosis not present

## 2016-05-02 ENCOUNTER — Ambulatory Visit
Admission: RE | Admit: 2016-05-02 | Discharge: 2016-05-02 | Disposition: A | Discharge status: Home / Self Care | Payer: PPO | Source: Ambulatory Visit | Attending: General Surgery | Admitting: General Surgery

## 2016-05-02 ENCOUNTER — Ambulatory Visit: Payer: PPO | Admitting: Anesthesiology

## 2016-05-02 ENCOUNTER — Encounter
Admission: RE | Disposition: A | Discharge status: Home / Self Care | Payer: Self-pay | Source: Ambulatory Visit | Attending: General Surgery

## 2016-05-02 ENCOUNTER — Encounter: Payer: Self-pay | Admitting: *Deleted

## 2016-05-02 DIAGNOSIS — J449 Chronic obstructive pulmonary disease, unspecified: Secondary | ICD-10-CM | POA: Insufficient documentation

## 2016-05-02 DIAGNOSIS — I10 Essential (primary) hypertension: Secondary | ICD-10-CM | POA: Insufficient documentation

## 2016-05-02 DIAGNOSIS — K5790 Diverticulosis of intestine, part unspecified, without perforation or abscess without bleeding: Secondary | ICD-10-CM | POA: Diagnosis not present

## 2016-05-02 DIAGNOSIS — K219 Gastro-esophageal reflux disease without esophagitis: Secondary | ICD-10-CM | POA: Diagnosis not present

## 2016-05-02 DIAGNOSIS — K635 Polyp of colon: Secondary | ICD-10-CM | POA: Diagnosis not present

## 2016-05-02 DIAGNOSIS — K5730 Diverticulosis of large intestine without perforation or abscess without bleeding: Secondary | ICD-10-CM | POA: Diagnosis not present

## 2016-05-02 DIAGNOSIS — Z86010 Personal history of colonic polyps: Secondary | ICD-10-CM | POA: Diagnosis not present

## 2016-05-02 DIAGNOSIS — K7460 Unspecified cirrhosis of liver: Secondary | ICD-10-CM | POA: Diagnosis not present

## 2016-05-02 DIAGNOSIS — Z1211 Encounter for screening for malignant neoplasm of colon: Secondary | ICD-10-CM | POA: Insufficient documentation

## 2016-05-02 HISTORY — PX: COLONOSCOPY: SHX5424

## 2016-05-02 SURGERY — COLONOSCOPY
Anesthesia: General

## 2016-05-02 MED ORDER — PROPOFOL 500 MG/50ML IV EMUL
INTRAVENOUS | Status: DC | PRN
  Administered 2016-05-02: 120 ug/kg/min via INTRAVENOUS

## 2016-05-02 MED ORDER — SODIUM CHLORIDE 0.9 % IV SOLN
INTRAVENOUS | Status: DC
  Administered 2016-05-02: 1000 mL via INTRAVENOUS

## 2016-05-02 MED ORDER — MIDAZOLAM HCL 2 MG/2ML IJ SOLN
INTRAMUSCULAR | Status: DC | PRN
  Administered 2016-05-02: 1 mg via INTRAVENOUS

## 2016-05-02 MED ORDER — PROPOFOL 10 MG/ML IV BOLUS
INTRAVENOUS | Status: DC | PRN
  Administered 2016-05-02: 40 mg via INTRAVENOUS

## 2016-05-02 NOTE — Anesthesia Preprocedure Evaluation (Signed)
Anesthesia Evaluation  Patient identified by MRN, date of birth, ID band Patient awake    Reviewed: Allergy & Precautions, NPO status , Patient's Chart, lab work & pertinent test results  Airway Mallampati: II       Dental  (+) Teeth Intact   Pulmonary neg pulmonary ROS, COPD, Current Smoker,     + decreased breath sounds      Cardiovascular Exercise Tolerance: Good hypertension, Pt. on medications  Rhythm:Regular Rate:Normal     Neuro/Psych negative neurological ROS  negative psych ROS   GI/Hepatic GERD  Medicated,(+) Cirrhosis       , Hepatitis -  Endo/Other  negative endocrine ROS  Renal/GU      Musculoskeletal   Abdominal Normal abdominal exam  (+)   Peds negative pediatric ROS (+)  Hematology negative hematology ROS (+)   Anesthesia Other Findings   Reproductive/Obstetrics                             Anesthesia Physical Anesthesia Plan  ASA: II  Anesthesia Plan: General   Post-op Pain Management:    Induction: Intravenous  Airway Management Planned: Natural Airway and Nasal Cannula  Additional Equipment:   Intra-op Plan:   Post-operative Plan:   Informed Consent: I have reviewed the patients History and Physical, chart, labs and discussed the procedure including the risks, benefits and alternatives for the proposed anesthesia with the patient or authorized representative who has indicated his/her understanding and acceptance.     Plan Discussed with: CRNA  Anesthesia Plan Comments:         Anesthesia Quick Evaluation

## 2016-05-02 NOTE — Anesthesia Postprocedure Evaluation (Signed)
Anesthesia Post Note  Patient: Craig Calhoun  Procedure(s) Performed: Procedure(s) (LRB): COLONOSCOPY (N/A)  Patient location during evaluation: PACU Anesthesia Type: General Level of consciousness: awake Pain management: pain level controlled Vital Signs Assessment: post-procedure vital signs reviewed and stable Respiratory status: spontaneous breathing Cardiovascular status: stable Anesthetic complications: no    Last Vitals:  Vitals:   05/02/16 1330 05/02/16 1443  BP: (!) 146/102 (!) 124/54  Pulse: 63 68  Resp: 18   Temp: 36.3 C 36.3 C    Last Pain:  Vitals:   05/02/16 1443  TempSrc: Tympanic                 VAN STAVEREN,Braelynne Garinger

## 2016-05-02 NOTE — H&P (Signed)
For colonoscopy to follow up previously identified tubular adenoma in the descending colon.   Tolerated prep well.   No change in clinical history or exam.

## 2016-05-02 NOTE — Transfer of Care (Signed)
Immediate Anesthesia Transfer of Care Note  Patient: Craig Calhoun  Procedure(s) Performed: Procedure(s): COLONOSCOPY (N/A)  Patient Location: PACU  Anesthesia Type:General  Level of Consciousness: awake and alert   Airway & Oxygen Therapy: Patient Spontanous Breathing  Post-op Assessment: Report given to RN and Post -op Vital signs reviewed and stable  Post vital signs: Reviewed and stable  Last Vitals:  Vitals:   05/02/16 1330  BP: (!) 146/102  Pulse: 63  Resp: 18  Temp: 36.3 C    Last Pain:  Vitals:   05/02/16 1330  TempSrc: Tympanic         Complications: No apparent anesthesia complications

## 2016-05-02 NOTE — Op Note (Signed)
[facility] Gastroenterology Patient Name: Rosio Calhoun Procedure Date: 05/02/2016 2:00 PM MRN: [id number] Account #: [id number] Date of Birth: 09-Apr-1951 Admit Type: Outpatient Age: 65 Room: [facility] ENDO ROOM 3 Gender: Male Note Status: Finalized Procedure:            Colonoscopy Indications:          High risk colon cancer surveillance: Personal history                        of colonic polyps Providers:            Robert Bellow, MD Referring MD:         Venia Carbon (Referring MD) Medicines:            Monitored Anesthesia Care Complications:        No immediate complications. Procedure:            Pre-Anesthesia Assessment:                       - Prior to the procedure, a History and Physical was                        performed, and patient medications, allergies and                        sensitivities were reviewed. The patient's tolerance of                        previous anesthesia was reviewed.                       - The risks and benefits of the procedure and the                        sedation options and risks were discussed with the                        patient. All questions were answered and informed                        consent was obtained.                       After obtaining informed consent, the colonoscope was                        passed under direct vision. Throughout the procedure,                        the patient's blood pressure, pulse, and oxygen                        saturations were monitored continuously. The                        Colonoscope was introduced through the anus and                        advanced to the the cecum, identified by the ileocecal  valve. The colonoscopy was somewhat difficult due to                        multiple diverticula in the colon and significant                        looping. Successful completion of the procedure was                        aided by  changing the patient to a supine position and                        using manual pressure. The patient tolerated the                        procedure well. The quality of the bowel preparation                        was adequate to identify polyps 6 mm and larger in size. Findings:      Multiple medium-mouthed diverticula were found in the sigmoid colon.      The retroflexed view of the distal rectum and anal verge was normal and       showed no anal or rectal abnormalities. Impression:           - Diverticulosis in the sigmoid colon.                       - The distal rectum and anal verge are normal on                        retroflexion view.                       - No specimens collected. Recommendation:       - Repeat colonoscopy in 5 years for surveillance. Procedure Code(s):    --- Professional ---                       [phone], Colonoscopy, flexible; diagnostic, including                        collection of specimen(s) by brushing or washing, when                        performed (separate procedure) Diagnosis Code(s):    --- Professional ---                       Z86.010, Personal history of colonic polyps                       K57.30, Diverticulosis of large intestine without                        perforation or abscess without bleeding CPT copyright 2016 American Medical Association. All rights reserved. The codes documented in this report are preliminary and upon coder review may  be revised to meet current compliance requirements. Robert Bellow, MD 05/02/2016 2:43:18 PM This report has been signed electronically. Number of Addenda: 0 Note Initiated On: 05/02/2016 2:00 PM Scope Withdrawal Time: 0 hours 15  minutes 54 seconds  Total Procedure Duration: 0 hours 26 minutes 48 seconds       [facility]

## 2016-05-05 ENCOUNTER — Encounter: Payer: Self-pay | Admitting: General Surgery

## 2016-05-05 ENCOUNTER — Other Ambulatory Visit: Payer: Self-pay | Admitting: Nurse Practitioner

## 2016-05-05 DIAGNOSIS — K7469 Other cirrhosis of liver: Secondary | ICD-10-CM | POA: Diagnosis not present

## 2016-05-22 ENCOUNTER — Other Ambulatory Visit: Payer: Self-pay | Admitting: Internal Medicine

## 2016-06-11 ENCOUNTER — Ambulatory Visit
Admission: RE | Admit: 2016-06-11 | Discharge: 2016-06-11 | Disposition: A | Discharge status: Home / Self Care | Payer: PPO | Source: Ambulatory Visit | Attending: Nurse Practitioner | Admitting: Nurse Practitioner

## 2016-06-11 DIAGNOSIS — K7460 Unspecified cirrhosis of liver: Secondary | ICD-10-CM | POA: Diagnosis not present

## 2016-06-11 DIAGNOSIS — K7469 Other cirrhosis of liver: Secondary | ICD-10-CM

## 2016-07-14 DIAGNOSIS — H9122 Sudden idiopathic hearing loss, left ear: Secondary | ICD-10-CM | POA: Diagnosis not present

## 2016-07-14 DIAGNOSIS — H903 Sensorineural hearing loss, bilateral: Secondary | ICD-10-CM | POA: Diagnosis not present

## 2016-08-06 ENCOUNTER — Other Ambulatory Visit: Payer: Self-pay | Admitting: Otolaryngology

## 2016-08-06 DIAGNOSIS — H9190 Unspecified hearing loss, unspecified ear: Secondary | ICD-10-CM

## 2016-08-06 DIAGNOSIS — H9122 Sudden idiopathic hearing loss, left ear: Secondary | ICD-10-CM | POA: Diagnosis not present

## 2016-08-06 DIAGNOSIS — H903 Sensorineural hearing loss, bilateral: Secondary | ICD-10-CM | POA: Diagnosis not present

## 2016-08-18 ENCOUNTER — Ambulatory Visit
Admission: RE | Admit: 2016-08-18 | Discharge: 2016-08-18 | Disposition: A | Discharge status: Home / Self Care | Payer: PPO | Source: Ambulatory Visit | Attending: Otolaryngology | Admitting: Otolaryngology

## 2016-08-18 DIAGNOSIS — R51 Headache: Secondary | ICD-10-CM | POA: Diagnosis not present

## 2016-08-18 DIAGNOSIS — H9190 Unspecified hearing loss, unspecified ear: Secondary | ICD-10-CM

## 2016-08-18 LAB — POCT I-STAT CREATININE: CREATININE: 1.1 mg/dL (ref 0.61–1.24)

## 2016-08-18 MED ORDER — GADOBENATE DIMEGLUMINE 529 MG/ML IV SOLN
20.0000 mL | Freq: Once | INTRAVENOUS | Status: AC | PRN
  Administered 2016-08-18: 20 mL via INTRAVENOUS

## 2016-08-25 ENCOUNTER — Other Ambulatory Visit: Payer: Self-pay | Admitting: Internal Medicine

## 2016-09-12 ENCOUNTER — Encounter: Payer: Self-pay | Admitting: Internal Medicine

## 2016-09-12 ENCOUNTER — Ambulatory Visit (INDEPENDENT_AMBULATORY_CARE_PROVIDER_SITE_OTHER): Payer: PPO | Admitting: Internal Medicine

## 2016-09-12 VITALS — BP 134/80 | HR 57 | Temp 98.5°F | Ht 72.5 in | Wt 206.0 lb

## 2016-09-12 DIAGNOSIS — S161XXA Strain of muscle, fascia and tendon at neck level, initial encounter: Secondary | ICD-10-CM | POA: Diagnosis not present

## 2016-09-12 DIAGNOSIS — K7460 Unspecified cirrhosis of liver: Secondary | ICD-10-CM

## 2016-09-12 DIAGNOSIS — N400 Enlarged prostate without lower urinary tract symptoms: Secondary | ICD-10-CM | POA: Diagnosis not present

## 2016-09-12 DIAGNOSIS — Z0000 Encounter for general adult medical examination without abnormal findings: Secondary | ICD-10-CM | POA: Diagnosis not present

## 2016-09-12 DIAGNOSIS — I10 Essential (primary) hypertension: Secondary | ICD-10-CM

## 2016-09-12 DIAGNOSIS — I700 Atherosclerosis of aorta: Secondary | ICD-10-CM | POA: Diagnosis not present

## 2016-09-12 DIAGNOSIS — Z7189 Other specified counseling: Secondary | ICD-10-CM | POA: Diagnosis not present

## 2016-09-12 DIAGNOSIS — Z23 Encounter for immunization: Secondary | ICD-10-CM

## 2016-09-12 LAB — CBC WITH DIFFERENTIAL/PLATELET
BASOS ABS: 0.1 10*3/uL (ref 0.0–0.1)
Basophils Relative: 0.7 % (ref 0.0–3.0)
EOS ABS: 0.2 10*3/uL (ref 0.0–0.7)
Eosinophils Relative: 2.9 % (ref 0.0–5.0)
HEMATOCRIT: 45.2 % (ref 39.0–52.0)
Hemoglobin: 15.1 g/dL (ref 13.0–17.0)
LYMPHS PCT: 22.6 % (ref 12.0–46.0)
Lymphs Abs: 1.9 10*3/uL (ref 0.7–4.0)
MCHC: 33.3 g/dL (ref 30.0–36.0)
MCV: 93.6 fl (ref 78.0–100.0)
Monocytes Absolute: 0.9 10*3/uL (ref 0.1–1.0)
Monocytes Relative: 11.5 % (ref 3.0–12.0)
NEUTROS ABS: 5.1 10*3/uL (ref 1.4–7.7)
NEUTROS PCT: 62.3 % (ref 43.0–77.0)
PLATELETS: 258 10*3/uL (ref 150.0–400.0)
RBC: 4.83 Mil/uL (ref 4.22–5.81)
RDW: 14.9 % (ref 11.5–15.5)
WBC: 8.2 10*3/uL (ref 4.0–10.5)

## 2016-09-12 LAB — COMPREHENSIVE METABOLIC PANEL
ALBUMIN: 4.2 g/dL (ref 3.5–5.2)
ALK PHOS: 54 U/L (ref 39–117)
ALT: 18 U/L (ref 0–53)
AST: 20 U/L (ref 0–37)
BILIRUBIN TOTAL: 0.6 mg/dL (ref 0.2–1.2)
BUN: 21 mg/dL (ref 6–23)
CALCIUM: 9.6 mg/dL (ref 8.4–10.5)
CO2: 32 meq/L (ref 19–32)
CREATININE: 0.99 mg/dL (ref 0.40–1.50)
Chloride: 106 mEq/L (ref 96–112)
GFR: 80.41 mL/min (ref >60.00)
Glucose, Bld: 99 mg/dL (ref 70–99)
Potassium: 4.6 mEq/L (ref 3.5–5.1)
Sodium: 142 mEq/L (ref 135–145)
TOTAL PROTEIN: 7.2 g/dL (ref 6.0–8.3)

## 2016-09-12 LAB — PSA: PSA: 0.31 ng/mL (ref 0.10–4.00)

## 2016-09-12 NOTE — Assessment & Plan Note (Signed)
Found on MRI Discussed stopping smoking Statin and ASA not a good idea with the cirrhosis

## 2016-09-12 NOTE — Addendum Note (Signed)
Addended by: Pilar Grammes on: 09/12/2016 09:46 AM   Modules accepted: Orders

## 2016-09-12 NOTE — Assessment & Plan Note (Signed)
Discussed heat and care with positioning at work

## 2016-09-12 NOTE — Progress Notes (Signed)
Pre visit review using our clinic review tool, if applicable. No additional management support is needed unless otherwise documented below in the visit note. 

## 2016-09-12 NOTE — Assessment & Plan Note (Signed)
I have personally reviewed the Medicare Annual Wellness questionnaire and have noted 1. The patient's medical and social history 2. Their use of alcohol, tobacco or illicit drugs 3. Their current medications and supplements 4. The patient's functional ability including ADL's, fall risks, home safety risks and hearing or visual             impairment. 5. Diet and physical activities 6. Evidence for depression or mood disorders  The patients weight, height, BMI and visual acuity have been recorded in the chart I have made referrals, counseling and provided education to the patient based review of the above and I have provided the pt with a written personalized care plan for preventive services.  I have provided you with a copy of your personalized plan for preventive services. Please take the time to review along with your updated medication list.  Will give prevnar Yearly flu vaccine Discussed PSA --will check Just had colon Discussed fitness No AAA on MRI of abdomen

## 2016-09-12 NOTE — Progress Notes (Signed)
Subjective:    Patient ID: Craig Calhoun, male    DOB: Mar 18, 1951, 66 y.o.   MRN: [id number]  HPI Here for Welcome to Medicare visit and follow up of chronic health conditions Reviewed form and advanced directives Reviewed other doctors No alcohol Never completely stopped smoking--quit 3 times this year but restarted each time No set exercise-- physically active No falls No depression or anhedonia Independent with instrumental ADLs No apparent memory issues Vision is okay---due for recheck  Has had some occipital pain---waxes and wanes For several months ?related to work--full time construction work No arm weakness or numbness Tries some aleve at times--helps  Hearing worsened recently--especially left ear Saw Dr Craig Calhoun Got steroid regimen--got some better MRI was okay  Has to urinate a lot He does drink a lot though Stream is fine Nocturia usually once No sexual problems  Doesn't check BP No headaches No chest pain or SOB No dizziness or syncope recently No edema  Still follows for cirrhosis Recent ultrasound okay  Continues on the omeprazole daily---for GERD This keeps heartburn controlled No dysphagia  Current Outpatient Prescriptions on File Prior to Visit  Medication Sig Dispense Refill  . omeprazole (PRILOSEC) 20 MG capsule TAKE ONE (1) CAPSULE EACH DAY 30 capsule 11  . triamterene-hydrochlorothiazide (MAXZIDE-25) 37.5-25 MG tablet TAKE ONE (1) TABLET EACH DAY 90 tablet 0   No current facility-administered medications on file prior to visit.     Allergies  Allergen Reactions  . Lisinopril     Uvula swelling---?angioedema    Past Medical History:  Diagnosis Date  . GERD (gastroesophageal reflux disease)   . Hepatitis C    Rx interferon/ribaviron 2004-'05 (recurred after Rx), treated and cleared with Harvoni 2016  . Hypertension     Past Surgical History:  Procedure Laterality Date  . APPENDECTOMY    . COLONOSCOPY  09/2010   1  polyp-tubular adenoma  . COLONOSCOPY N/A 05/02/2016   Procedure: COLONOSCOPY;  Surgeon: Craig Bellow, MD;  Location: [facility] ENDOSCOPY;  Service: Endoscopy;  Laterality: N/A;  . LIVER BIOPSY    . LUMBAR LAMINECTOMY    . MENISCUS REPAIR Left 4/14    Family History  Problem Relation Age of Onset  . Diabetes Mother   . Heart disease Father   . Breast cancer Sister   . Colon cancer Maternal Aunt   . Hypertension Neg Hx   . Cancer Neg Hx     colon or prostate cancer    Social History   Social History  . Marital status: Married    Spouse name: N/A  . Number of children: 3  . Years of education: N/A   Occupational History  . Futures trader, has farm     part time now   Social History Main Topics  . Smoking status: Current Every Day Smoker    Packs/day: 0.50    Types: Cigarettes    Last attempt to quit: 07/16/2015  . Smokeless tobacco: Never Used  . Alcohol use 0.6 oz/week    1 Cans of beer per week  . Drug use: No  . Sexual activity: Not on file   Other Topics Concern  . Not on file   Social History Narrative   No living will   No formal health care POA--would want wife, then daughter   Would accept resuscitation attempts   Not sure about tube feeds   Review of Systems Appetite is good Weight is stable Sleep is not great--- chronic. No sig daytime  somnolence Wears seat belt Teeth are fine---keeps up with dentist ([place]) Bowels are fine -- no blood Mild occasional low back pain. No major joint problems. Keeps up with dermatologist--- some lesions removed    Objective:   Physical Exam  Constitutional: He is oriented to person, place, and time. He appears well-nourished. No distress.  HENT:  Mouth/Throat: Oropharynx is clear and moist. No oropharyngeal exudate.  Neck: No thyromegaly present.  Cardiovascular: Normal rate, regular rhythm, normal heart sounds and intact distal pulses.  Exam reveals no gallop.   No murmur  heard. Pulmonary/Chest: Effort normal and breath sounds normal. No respiratory distress. He has no wheezes. He has no rales.  Abdominal: Soft. He exhibits no distension. There is no tenderness. There is no rebound and no guarding.  Musculoskeletal: He exhibits no edema or tenderness.  Lymphadenopathy:    He has no cervical adenopathy.  Neurological: He is alert and oriented to person, place, and time.  President-- "Craig Calhoun, Craig Calhoun" [phone] D-l-r-o-w Recall 0/3 (didn't concentrate and long time before asked)  Skin: No rash noted. No erythema.  Psychiatric: He has a normal mood and affect. His behavior is normal.          Assessment & Plan:

## 2016-09-12 NOTE — Assessment & Plan Note (Signed)
Mild symptoms No meds for now 

## 2016-09-12 NOTE — Assessment & Plan Note (Signed)
BP Readings from Last 3 Encounters:  09/12/16 134/80  05/02/16 133/80  04/21/16 130/80   Reasonable control

## 2016-09-12 NOTE — Assessment & Plan Note (Signed)
Blank forms done

## 2016-09-12 NOTE — Assessment & Plan Note (Signed)
From past Hep C Follows with liver clinic and had surveillance ultrasound

## 2016-10-29 ENCOUNTER — Other Ambulatory Visit: Payer: Self-pay | Admitting: Nurse Practitioner

## 2016-10-29 DIAGNOSIS — K7469 Other cirrhosis of liver: Secondary | ICD-10-CM

## 2016-10-29 DIAGNOSIS — K7460 Unspecified cirrhosis of liver: Secondary | ICD-10-CM | POA: Diagnosis not present

## 2016-11-11 ENCOUNTER — Ambulatory Visit
Admission: RE | Admit: 2016-11-11 | Discharge: 2016-11-11 | Disposition: A | Discharge status: Home / Self Care | Payer: PPO | Source: Ambulatory Visit | Attending: Nurse Practitioner | Admitting: Nurse Practitioner

## 2016-11-11 DIAGNOSIS — K7460 Unspecified cirrhosis of liver: Secondary | ICD-10-CM | POA: Diagnosis not present

## 2016-11-11 DIAGNOSIS — K7469 Other cirrhosis of liver: Secondary | ICD-10-CM

## 2016-11-11 DIAGNOSIS — K824 Cholesterolosis of gallbladder: Secondary | ICD-10-CM | POA: Diagnosis not present

## 2017-02-02 ENCOUNTER — Other Ambulatory Visit: Payer: Self-pay | Admitting: Internal Medicine

## 2017-04-03 DIAGNOSIS — M545 Low back pain: Secondary | ICD-10-CM | POA: Diagnosis not present

## 2017-05-03 DIAGNOSIS — J019 Acute sinusitis, unspecified: Secondary | ICD-10-CM | POA: Diagnosis not present

## 2017-05-03 DIAGNOSIS — J069 Acute upper respiratory infection, unspecified: Secondary | ICD-10-CM | POA: Diagnosis not present

## 2017-05-06 DIAGNOSIS — Z86018 Personal history of other benign neoplasm: Secondary | ICD-10-CM | POA: Diagnosis not present

## 2017-05-06 DIAGNOSIS — L570 Actinic keratosis: Secondary | ICD-10-CM | POA: Diagnosis not present

## 2017-05-06 DIAGNOSIS — I781 Nevus, non-neoplastic: Secondary | ICD-10-CM | POA: Diagnosis not present

## 2017-05-06 DIAGNOSIS — L821 Other seborrheic keratosis: Secondary | ICD-10-CM | POA: Diagnosis not present

## 2017-05-06 DIAGNOSIS — L578 Other skin changes due to chronic exposure to nonionizing radiation: Secondary | ICD-10-CM | POA: Diagnosis not present

## 2017-05-26 ENCOUNTER — Other Ambulatory Visit: Payer: Self-pay | Admitting: Nurse Practitioner

## 2017-05-26 DIAGNOSIS — K7469 Other cirrhosis of liver: Secondary | ICD-10-CM

## 2017-06-01 ENCOUNTER — Other Ambulatory Visit: Payer: PPO

## 2017-06-15 ENCOUNTER — Ambulatory Visit
Admission: RE | Admit: 2017-06-15 | Discharge: 2017-06-15 | Disposition: A | Discharge status: Home / Self Care | Payer: PPO | Source: Ambulatory Visit | Attending: Nurse Practitioner | Admitting: Nurse Practitioner

## 2017-06-15 DIAGNOSIS — K7469 Other cirrhosis of liver: Secondary | ICD-10-CM | POA: Diagnosis not present

## 2017-06-15 DIAGNOSIS — K7460 Unspecified cirrhosis of liver: Secondary | ICD-10-CM | POA: Diagnosis not present

## 2017-07-01 ENCOUNTER — Other Ambulatory Visit: Payer: Self-pay | Admitting: Internal Medicine

## 2017-07-21 ENCOUNTER — Other Ambulatory Visit: Payer: Self-pay | Admitting: Nurse Practitioner

## 2017-07-21 DIAGNOSIS — K7469 Other cirrhosis of liver: Secondary | ICD-10-CM

## 2017-12-30 DIAGNOSIS — K7469 Other cirrhosis of liver: Secondary | ICD-10-CM | POA: Diagnosis not present

## 2018-01-06 ENCOUNTER — Ambulatory Visit
Admission: RE | Admit: 2018-01-06 | Discharge: 2018-01-06 | Disposition: A | Discharge status: Home / Self Care | Payer: PPO | Source: Ambulatory Visit | Attending: Nurse Practitioner | Admitting: Nurse Practitioner

## 2018-01-06 DIAGNOSIS — K7460 Unspecified cirrhosis of liver: Secondary | ICD-10-CM | POA: Diagnosis not present

## 2018-01-06 DIAGNOSIS — K7469 Other cirrhosis of liver: Secondary | ICD-10-CM

## 2018-01-07 ENCOUNTER — Other Ambulatory Visit: Payer: Self-pay | Admitting: Nurse Practitioner

## 2018-01-07 DIAGNOSIS — K7469 Other cirrhosis of liver: Secondary | ICD-10-CM

## 2018-02-04 DIAGNOSIS — H903 Sensorineural hearing loss, bilateral: Secondary | ICD-10-CM | POA: Diagnosis not present

## 2018-02-08 ENCOUNTER — Other Ambulatory Visit: Payer: Self-pay | Admitting: Internal Medicine

## 2018-02-08 DIAGNOSIS — J039 Acute tonsillitis, unspecified: Secondary | ICD-10-CM | POA: Diagnosis not present

## 2018-02-11 DIAGNOSIS — H903 Sensorineural hearing loss, bilateral: Secondary | ICD-10-CM | POA: Diagnosis not present

## 2018-02-18 ENCOUNTER — Ambulatory Visit (INDEPENDENT_AMBULATORY_CARE_PROVIDER_SITE_OTHER): Payer: PPO | Admitting: Internal Medicine

## 2018-02-18 ENCOUNTER — Encounter: Payer: Self-pay | Admitting: Internal Medicine

## 2018-02-18 VITALS — BP 140/86 | HR 60 | Temp 98.4°F | Ht 71.75 in | Wt 199.0 lb

## 2018-02-18 DIAGNOSIS — K7460 Unspecified cirrhosis of liver: Secondary | ICD-10-CM | POA: Diagnosis not present

## 2018-02-18 DIAGNOSIS — I10 Essential (primary) hypertension: Secondary | ICD-10-CM

## 2018-02-18 DIAGNOSIS — Z0000 Encounter for general adult medical examination without abnormal findings: Secondary | ICD-10-CM

## 2018-02-18 DIAGNOSIS — K219 Gastro-esophageal reflux disease without esophagitis: Secondary | ICD-10-CM | POA: Diagnosis not present

## 2018-02-18 DIAGNOSIS — Z23 Encounter for immunization: Secondary | ICD-10-CM | POA: Diagnosis not present

## 2018-02-18 DIAGNOSIS — I700 Atherosclerosis of aorta: Secondary | ICD-10-CM | POA: Diagnosis not present

## 2018-02-18 DIAGNOSIS — Z7189 Other specified counseling: Secondary | ICD-10-CM

## 2018-02-18 NOTE — Assessment & Plan Note (Signed)
Symptoms controlled Discussed trying to reduce the frequency of PPI

## 2018-02-18 NOTE — Progress Notes (Signed)
Visual Acuity Screening   Right eye Left eye Both eyes  Without correction: 20/15 20/20 20/13   With correction:     Hearing Screening Comments: Recent hearing test, has hearing aids. Not wearing today.

## 2018-02-18 NOTE — Progress Notes (Signed)
Subjective:    Patient ID: Craig Calhoun, male    DOB: 1950/08/14, 67 y.o.   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 Has quit 2-3 times in past year--then relapses Has used nicotine gum Occasional beer [place] this year--- had gravel delivery and he slipped on ladder and grabbed tree and got bruised rib (not really a fall) Independent with instrumental ADLs Vision okay Has hearing aides  He occasionally has some discomfort in RUQ Liver provider doesn't think it is from liver No clear relationship with eating or work on the farm Usually goes away quickly  Had sense that left leg "felt different" while walking a few days ago On and off for about a minute---noticed it with a step No pain No claudication pain--reviewed that he had aortic atherosclerosis on past MRI Still smokes  No chest pain Gets easier DOE---?from smoking No major functional change though No regular cough No palpitations--but occasionally aware of his heart No edema Occasional dizziness but no syncope. Usually if bends and gets up quick  Continues on PPI No heartburn or dysphagia  He feels urinary stream is okay Nocturia is improved--- no more than once  Current Outpatient Medications on File Prior to Visit  Medication Sig Dispense Refill  . omeprazole (PRILOSEC) 20 MG capsule TAKE ONE (1) CAPSULE EACH DAY 30 capsule 11  . triamterene-hydrochlorothiazide (MAXZIDE-25) 37.5-25 MG tablet TAKE ONE (1) TABLET EACH DAY 90 tablet 3   No current facility-administered medications on file prior to visit.     Allergies  Allergen Reactions  . Lisinopril     Uvula swelling---?angioedema    Past Medical History:  Diagnosis Date  . GERD (gastroesophageal reflux disease)   . Hepatitis C    Rx  interferon/ribaviron 2004-'05 (recurred after Rx), treated and cleared with Harvoni 2016  . Hypertension     Past Surgical History:  Procedure Laterality Date  . APPENDECTOMY    . COLONOSCOPY  09/2010   1 polyp-tubular adenoma  . COLONOSCOPY N/A 05/02/2016   Procedure: COLONOSCOPY;  Surgeon: Robert Bellow, MD;  Location: [facility] ENDOSCOPY;  Service: Endoscopy;  Laterality: N/A;  . LIVER BIOPSY    . LUMBAR LAMINECTOMY    . MENISCUS REPAIR Left 4/14    Family History  Problem Relation Age of Onset  . Diabetes Mother   . Heart disease Father   . Breast cancer Sister   . Colon cancer Maternal Aunt   . Hypertension Neg Hx   . Cancer Neg Hx        colon or prostate cancer    Social History   Socioeconomic History  . Marital status: Married    Spouse name: Not on file  . Number of children: 3  . Years of education: Not on file  . Highest education level: Not on file  Occupational History  . Occupation: Futures trader, has farm    Comment: part time now  Social Needs  . Financial resource strain: Not on file  . Food insecurity:    Worry: Not on file    Inability: Not on file  . Transportation needs:    Medical: Not on file    Non-medical: Not on file  Tobacco Use  . Smoking status: Current Every Day Smoker    Packs/day: 0.50  Types: Cigarettes    Last attempt to quit: 07/16/2015    Years since quitting: 2.5  . Smokeless tobacco: Never Used  Substance and Sexual Activity  . Alcohol use: Yes    Alcohol/week: 1.0 standard drinks    Types: 1 Cans of beer per week  . Drug use: No  . Sexual activity: Not on file  Lifestyle  . Physical activity:    Days per week: Not on file    Minutes per session: Not on file  . Stress: Not on file  Relationships  . Social connections:    Talks on phone: Not on file    Gets together: Not on file    Attends religious service: Not on file    Active member of club or organization: Not on file    Attends meetings of  clubs or organizations: Not on file    Relationship status: Not on file  . Intimate partner violence:    Fear of current or ex partner: Not on file    Emotionally abused: Not on file    Physically abused: Not on file    Forced sexual activity: Not on file  Other Topics Concern  . Not on file  Social History Narrative   No living will   No formal health care POA--would want wife, then daughter   Would accept resuscitation attempts   Not sure about tube feeds   Review of Systems Recent sore throat---feels better now Sleeps fair-- has used aleve PM at times or melatonin (got crazy dreams though) Discussed concern with benedryl Appetite is fine Weight stable Wears seat belt Teeth fine---keeps up with dentist Sees derm once a year --no acute skin problems Bowels are fine. No blood No sig back or joint pains    Objective:   Physical Exam  Constitutional: He is oriented to person, place, and time. He appears well-developed. No distress.  HENT:  Head: Atraumatic.  Mouth/Throat: Oropharynx is clear and moist. No oropharyngeal exudate.  Neck: No thyromegaly present.  Cardiovascular: Normal rate, regular rhythm, normal heart sounds and intact distal pulses. Exam reveals no gallop.  No murmur heard. Frequent skips  Respiratory: Effort normal and breath sounds normal. No respiratory distress. He has no wheezes. He has no rales.  GI: Soft. There is no tenderness.  Musculoskeletal: He exhibits no edema or tenderness.  Lymphadenopathy:    He has no cervical adenopathy.  Neurological: He is alert and oriented to person, place, and time.  President---"Trump, Obama, Clinton---Bush" [id number] D-l-r-o-w Recall 2/3   Skin: No rash noted. No erythema.  Psychiatric: He has a normal mood and affect. His behavior is normal.           Assessment & Plan:

## 2018-02-18 NOTE — Assessment & Plan Note (Signed)
Based on MRI Again discussed cigarette cessation No statin due to liver disease

## 2018-02-18 NOTE — Assessment & Plan Note (Signed)
Compensated Does get every 6 month ultrasounds

## 2018-02-18 NOTE — Assessment & Plan Note (Signed)
BP Readings from Last 3 Encounters:  02/18/18 140/86  09/12/16 134/80  05/02/16 133/80   Adequate control Continue the medication Recent labs with liver provider--he will try to get me a copy

## 2018-02-18 NOTE — Assessment & Plan Note (Signed)
I have personally reviewed the Medicare Annual Wellness questionnaire and have noted 1. The patient's medical and social history 2. Their use of alcohol, tobacco or illicit drugs 3. Their current medications and supplements 4. The patient's functional ability including ADL's, fall risks, home safety risks and hearing or visual             impairment. 5. Diet and physical activities 6. Evidence for depression or mood disorders  The patients weight, height, BMI and visual acuity have been recorded in the chart I have made referrals, counseling and provided education to the patient based review of the above and I have provided the pt with a written personalized care plan for preventive services.  I have provided you with a copy of your personalized plan for preventive services. Please take the time to review along with your updated medication list.  Discussed cigarette cessation Consider PSA again next year Colon not due till 2022 Yearly flu vaccine Consider shingrix Pneumovax today--unless had at [facility]

## 2018-02-18 NOTE — Assessment & Plan Note (Signed)
See social history 

## 2018-02-18 NOTE — Addendum Note (Signed)
Addended by: Pilar Grammes on: 02/18/2018 05:02 PM   Modules accepted: Orders

## 2018-03-02 IMAGING — MR MR ABDOMEN WO/W CM
6 of 17 series · 20 of 48 positions shown · IV contrast (eovist)
Comparison: 05/29/2015

CLINICAL DATA: Cirrhosis.  Evaluate for hepatocellular carcinoma.

EXAM:
MRI ABDOMEN WITHOUT AND WITH CONTRAST
TECHNIQUE: Multiplanar multisequence MR imaging of the abdomen was performed
both before and after the administration of intravenous contrast.
CONTRAST:  9.0 ml Eovist, a mixed extracellular and hepatocyte
specific contrast agent.
Creatinine was obtained on site at [HOSPITAL] at [HOSPITAL].
Results: Creatinine 0.9 mg/dL.

[Series 2: T2 · coronal · 5.0mm · 1.48mm/px · 2 of 38 slices shown]
[im 1/38]
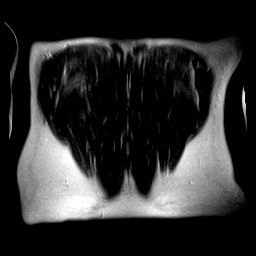
[im 38/38]
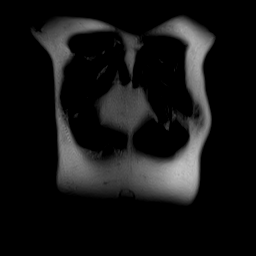

[Series 3: axial in out · axial · 6.0mm · 0.68mm/px · z∈[-145,+89]mm · 4 of 80 slices shown]
[im 1/80]
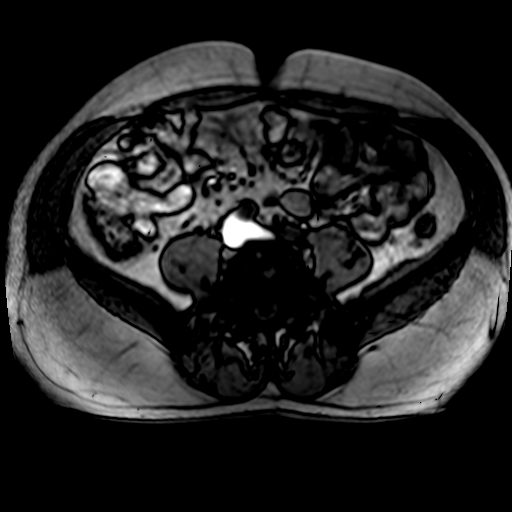
[im 27/80]
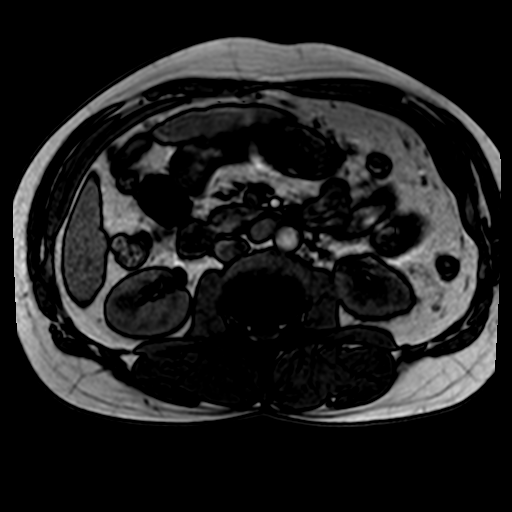
[im 53/80]
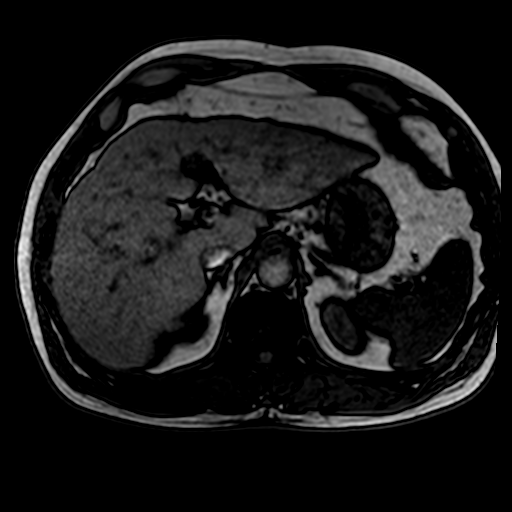
[im 80/80]
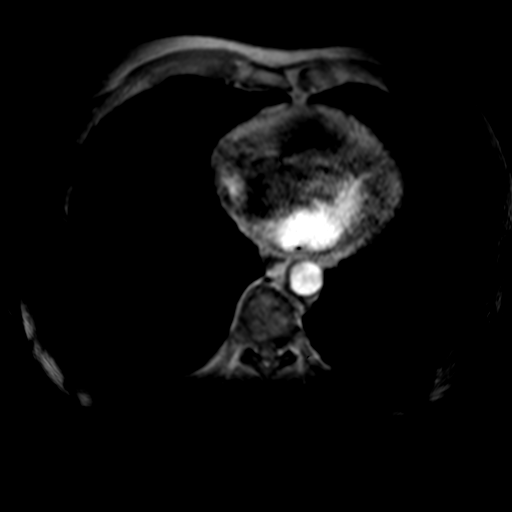

[Series 4: T1 dynamic · axial · non-contrast · 2.2mm · 0.72mm/px · z∈[-112,+79]mm · 4 of 88 slices shown]
[im 1/88]
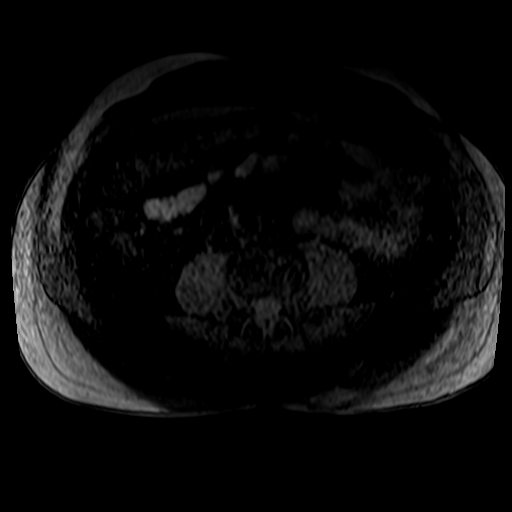
[im 30/88]
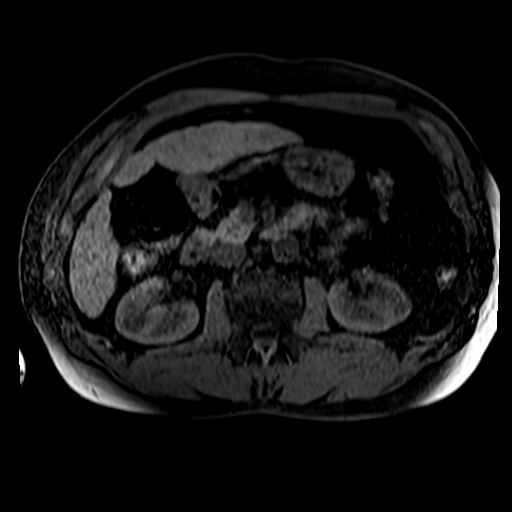
[im 59/88]
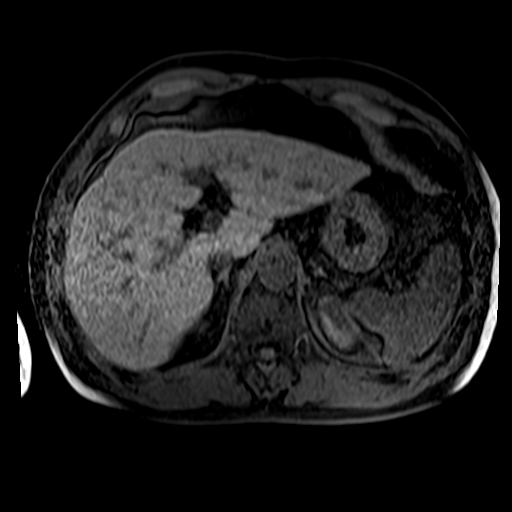
[im 88/88]
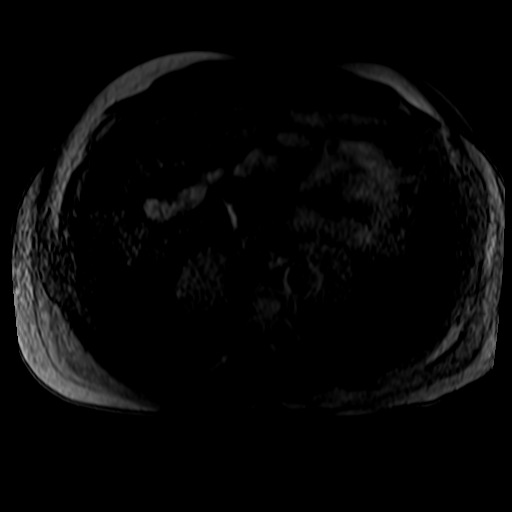

[Series 5: post immed · axial · 2.2mm · 0.72mm/px · z∈[-112,+79]mm · 4 of 88 slices shown]
[im 1/88]
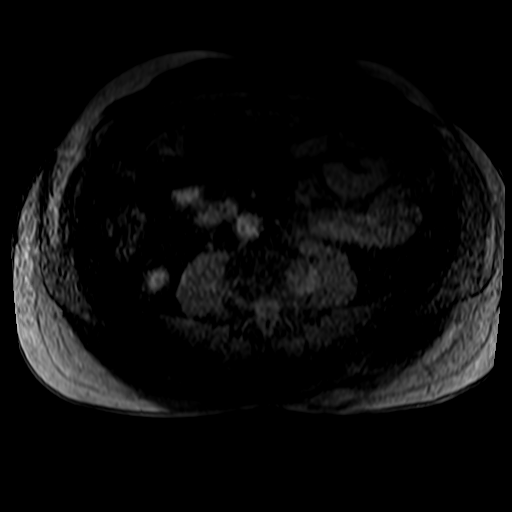
[im 30/88]
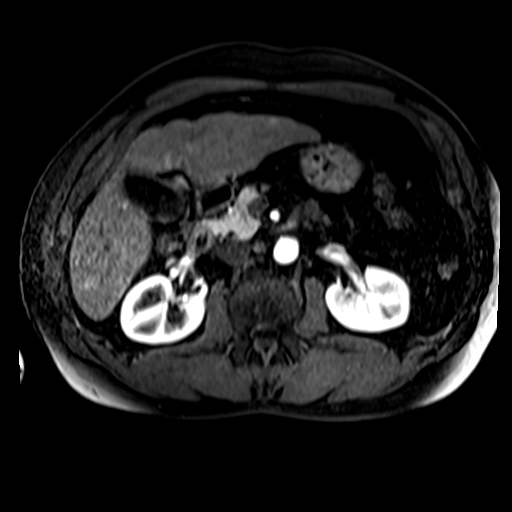
[im 59/88]
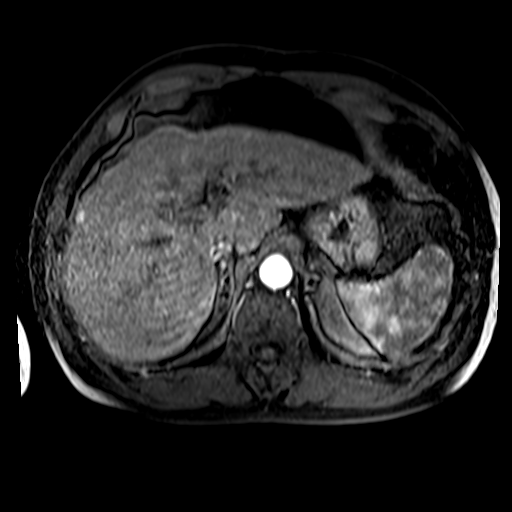
[im 88/88]
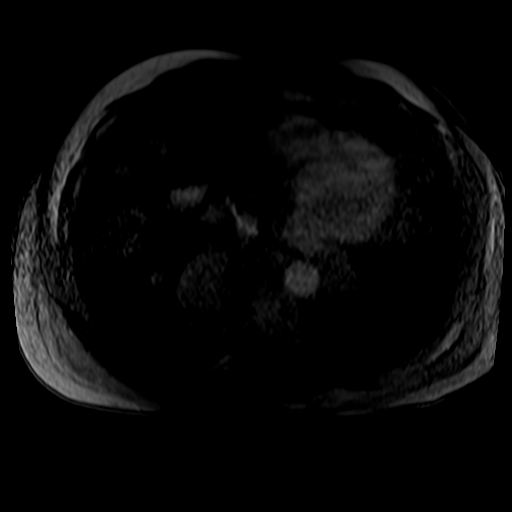

[Series 6: post immed_sub · axial · 2.2mm · 0.72mm/px · z∈[-112,+79]mm · 4 of 88 slices shown]
[im 1/88]
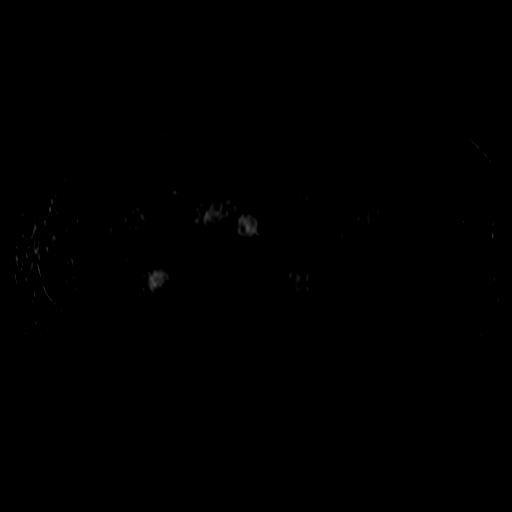
[im 30/88]
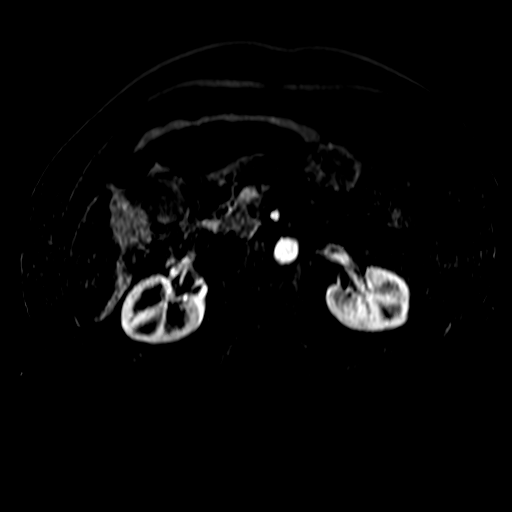
[im 59/88]
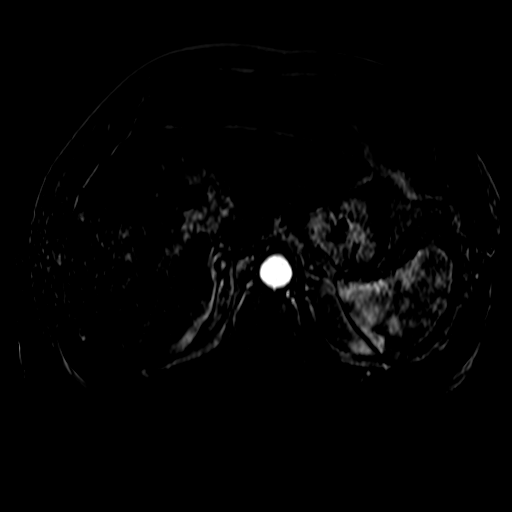
[im 88/88]
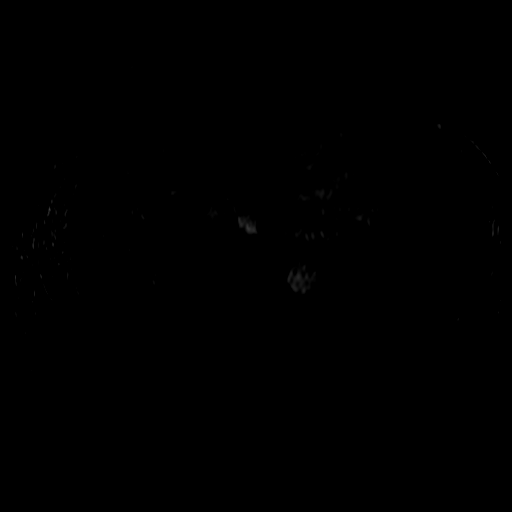

[Series 7: post 45 sec · axial · 2.2mm · 0.72mm/px · z∈[-112,-17]mm · 2 of 88 slices shown]
[im 1/88]
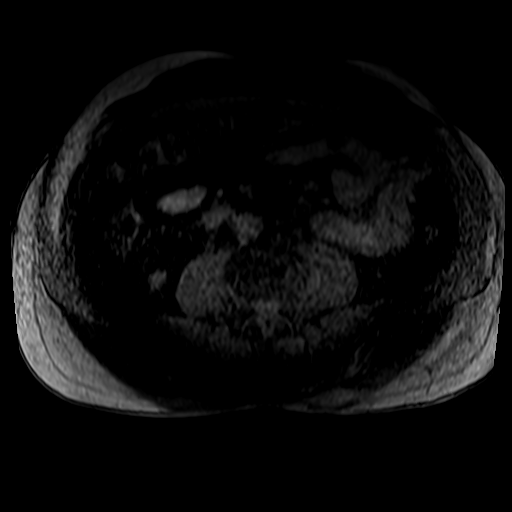
[im 44/88]
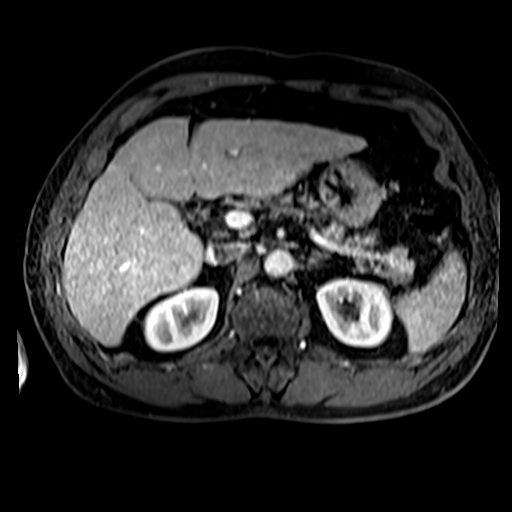

[20 of 48 positions shown; findings below may reference images not displayed]

FINDINGS: Lower chest: Normal heart size without pericardial or pleural
effusion.

Hepatobiliary: Moderate cirrhosis. No areas of T2 hyperintensity or
restricted diffusion. scattered areas of arterial hyper enhancement
again identified, including on series 5. Likely related to perfusion
anomalies. No areas of portal venous or delayed hypo enhancement.
Hepatobiliary phase imaging unremarkable.

Normal gallbladder, without biliary ductal dilatation.

Pancreas: Tiny foci of T2 hyperintensity within the pancreas, most
apparent on series 13. Similar and likely related to prior
pancreatitis. No main duct dilatation or dominant mass.

Spleen: Normal in size, without focal abnormality.

Adrenals/Urinary Tract: Normal adrenal glands. Normal kidneys,
without hydronephrosis.

Stomach/Bowel: Tiny hiatal hernia.  Normal abdominal bowel loops.

Vascular/Lymphatic: Aortic and branch vessel atherosclerosis. Patent
portal veins. Hepatic veins not well evaluated. No specific evidence
of portal venous hypertension. No retroperitoneal or retrocrural
adenopathy.

Other: No ascites.

Musculoskeletal: No acute osseous abnormality.
IMPRESSION: Cirrhosis without evidence of hepatocellular carcinoma.

## 2018-07-12 DIAGNOSIS — Z86018 Personal history of other benign neoplasm: Secondary | ICD-10-CM | POA: Diagnosis not present

## 2018-07-12 DIAGNOSIS — B078 Other viral warts: Secondary | ICD-10-CM | POA: Diagnosis not present

## 2018-07-12 DIAGNOSIS — L570 Actinic keratosis: Secondary | ICD-10-CM | POA: Diagnosis not present

## 2018-07-12 DIAGNOSIS — L578 Other skin changes due to chronic exposure to nonionizing radiation: Secondary | ICD-10-CM | POA: Diagnosis not present

## 2018-07-12 DIAGNOSIS — L821 Other seborrheic keratosis: Secondary | ICD-10-CM | POA: Diagnosis not present

## 2018-07-16 DIAGNOSIS — K7469 Other cirrhosis of liver: Secondary | ICD-10-CM | POA: Diagnosis not present

## 2018-07-19 ENCOUNTER — Other Ambulatory Visit: Payer: Self-pay | Admitting: Nurse Practitioner

## 2018-07-19 DIAGNOSIS — K7469 Other cirrhosis of liver: Secondary | ICD-10-CM

## 2018-07-30 ENCOUNTER — Ambulatory Visit
Admission: RE | Admit: 2018-07-30 | Discharge: 2018-07-30 | Disposition: A | Discharge status: Home / Self Care | Payer: PPO | Source: Ambulatory Visit | Attending: Nurse Practitioner | Admitting: Nurse Practitioner

## 2018-07-30 DIAGNOSIS — K7469 Other cirrhosis of liver: Secondary | ICD-10-CM | POA: Diagnosis not present

## 2018-07-30 DIAGNOSIS — K7460 Unspecified cirrhosis of liver: Secondary | ICD-10-CM | POA: Diagnosis not present

## 2018-08-31 ENCOUNTER — Other Ambulatory Visit: Payer: Self-pay

## 2018-08-31 MED ORDER — TRIAMTERENE-HCTZ 37.5-25 MG PO TABS: ORAL_TABLET | ORAL | 1 refills | Status: DC

## 2018-08-31 NOTE — Telephone Encounter (Signed)
[facility] said [facility] has requested refill x 2 on triamterene HCTZ 37.5-25 and we have not responded. I spoke with Arbie Cookey at [facility] and she have faxed x 2. I am sending refill per protocol to [facility] now and Arbie Cookey will look out for it so pt can pick up today.pt voiced understanding. Pt last annual 02/18/18 and scheduled for annual on 02/22/19.

## 2018-10-20 ENCOUNTER — Encounter: Payer: Self-pay | Admitting: Family Medicine

## 2018-10-20 ENCOUNTER — Ambulatory Visit (INDEPENDENT_AMBULATORY_CARE_PROVIDER_SITE_OTHER): Payer: PPO | Admitting: Family Medicine

## 2018-10-20 ENCOUNTER — Other Ambulatory Visit: Payer: Self-pay

## 2018-10-20 VITALS — Ht 71.75 in

## 2018-10-20 DIAGNOSIS — R0602 Shortness of breath: Secondary | ICD-10-CM | POA: Insufficient documentation

## 2018-10-20 MED ORDER — ALBUTEROL SULFATE HFA 108 (90 BASE) MCG/ACT IN AERS: 2.0000 | INHALATION_SPRAY | Freq: Four times a day (QID) | RESPIRATORY_TRACT | 0 refills | Status: DC | PRN

## 2018-10-20 NOTE — Progress Notes (Signed)
Virtual visit completed through [employer]. Due to national recommendations of social distancing due to [place] 19, a virtual visit is felt to be most appropriate for this patient at this time.   Patient location: in his car - in [place] Provider location: Financial controller at [facility], office If any vitals were documented, they were collected by patient at home unless specified below.   Ht 5' 11.75" (1.822 m)   BMI 27.18 kg/m   BP Readings from Last 3 Encounters:  02/18/18 140/86  09/12/16 134/80  05/02/16 133/80   CC: short of breath Subjective:    Patient ID: Craig Calhoun, male    DOB: Nov 21, 1950, 68 y.o.   MRN: [id number]  HPI: Craig Calhoun is a 68 y.o. male presenting on 10/20/2018 for Shortness of Breath (C/o sometimes feels short of breath and needs to take 1-2 deep breaths. Worse when lying in bed. Noticed 3-4 wks ago but seems to be worsening. Stopped smoking about 3 mos ago.  Pt is currently in [place], [place]. )   Last night noted increased need to take deeper breath to feel like he was getting good oxygen. After taking deep breaths did feel better. Not as noticeable during the day. Has been noticing mild shortness of breath for last 1-2 months.   No chest pain, headache, dizziness, fevers/chills, cough, wheezing. No unilateral leg swelling.   He's currently in [place] working all this week. Had 3 hr car ride to get there.  Ex smoker - quit 3 months ago! ~40 PY hx. No lung cancer screening CT yet.  No know h/o COPD.   No personal or family history of blood clots.  No fmhx lung disease.  H/o hep C s/p treatment with harvoni - with residual liver scarring. Sees liver doc Q74mo.      Relevant past medical, surgical, family and social history reviewed and updated as indicated. Interim medical history since our last visit reviewed. Allergies and medications reviewed and updated. Outpatient Medications Prior to Visit  Medication Sig Dispense Refill  . omeprazole (PRILOSEC) 20 MG  capsule TAKE ONE (1) CAPSULE EACH DAY (Patient taking differently: As needed) 30 capsule 11  . triamterene-hydrochlorothiazide (MAXZIDE-25) 37.5-25 MG tablet TAKE ONE (1) TABLET EACH DAY 90 tablet 1   No facility-administered medications prior to visit.      Per HPI unless specifically indicated in ROS section below Review of Systems Objective:    Ht 5' 11.75" (1.822 m)   BMI 27.18 kg/m   Wt Readings from Last 3 Encounters:  02/18/18 199 lb (90.3 kg)  09/12/16 206 lb (93.4 kg)  05/02/16 210 lb (95.3 kg)     Physical exam: Gen: alert, NAD, not ill appearing Pulm: speaks in complete sentences without increased work of breathing Psych: normal mood, normal thought content       Assessment & Plan:   Problem List Items Addressed This Visit    Shortness of breath    Suspect possible COPD in extensive smoking history, fortunately has now quit - congratulated. Anticipate higher altitude of Craig Calhoun contributing to increasing symptoms noted by patient. No red flags - doubt PE, ACS, Covid19, etc. Not consistent with COPD exacerbation at this time. Will Rx albuterol to use PRN, and rec office visit if ongoing dyspnea or if low O2 sats noted when he gets back into town (has pulse oximeter at home). Red flags to seek urgent care while in [place] reviewed. Pt agrees with plan.  Meds ordered this encounter  Medications  . albuterol (VENTOLIN HFA) 108 (90 Base) MCG/ACT inhaler    Sig: Inhale 2 puffs into the lungs every 6 (six) hours as needed for wheezing or shortness of breath.    Dispense:  1 Inhaler    Refill:  0    Formulate based on insurance preference/affordability   No orders of the defined types were placed in this encounter.   Follow up plan: No follow-ups on file.  Ria Bush, MD

## 2018-10-20 NOTE — Assessment & Plan Note (Signed)
Suspect possible COPD in extensive smoking history, fortunately has now quit - congratulated. Anticipate higher altitude of Craig Calhoun contributing to increasing symptoms noted by patient. No red flags - doubt PE, ACS, Covid19, etc. Not consistent with COPD exacerbation at this time. Will Rx albuterol to use PRN, and rec office visit if ongoing dyspnea or if low O2 sats noted when he gets back into town (has pulse oximeter at home). Red flags to seek urgent care while in [place] reviewed. Pt agrees with plan.

## 2018-12-17 ENCOUNTER — Encounter: Payer: Self-pay | Admitting: Internal Medicine

## 2018-12-17 ENCOUNTER — Other Ambulatory Visit: Payer: Self-pay

## 2018-12-17 ENCOUNTER — Ambulatory Visit (INDEPENDENT_AMBULATORY_CARE_PROVIDER_SITE_OTHER): Payer: PPO | Admitting: Internal Medicine

## 2018-12-17 VITALS — BP 128/76 | HR 54 | Temp 98.6°F | Ht 72.0 in | Wt 200.0 lb

## 2018-12-17 DIAGNOSIS — I499 Cardiac arrhythmia, unspecified: Secondary | ICD-10-CM | POA: Insufficient documentation

## 2018-12-17 DIAGNOSIS — N401 Enlarged prostate with lower urinary tract symptoms: Secondary | ICD-10-CM

## 2018-12-17 DIAGNOSIS — K7469 Other cirrhosis of liver: Secondary | ICD-10-CM

## 2018-12-17 DIAGNOSIS — I4891 Unspecified atrial fibrillation: Secondary | ICD-10-CM

## 2018-12-17 LAB — CBC
HCT: 47.7 % (ref 39.0–52.0)
Hemoglobin: 15.7 g/dL (ref 13.0–17.0)
MCHC: 33 g/dL (ref 30.0–36.0)
MCV: 94.9 fl (ref 78.0–100.0)
Platelets: 243 10*3/uL (ref 150.0–400.0)
RBC: 5.02 Mil/uL (ref 4.22–5.81)
RDW: 14.6 % (ref 11.5–15.5)
WBC: 9.6 10*3/uL (ref 4.0–10.5)

## 2018-12-17 LAB — T4, FREE: Free T4: 0.78 ng/dL (ref 0.60–1.60)

## 2018-12-17 LAB — LIPID PANEL
Cholesterol: 182 mg/dL (ref 0–200)
HDL: 36 mg/dL — ABNORMAL LOW (ref >39.00)
LDL Cholesterol: 106 mg/dL — ABNORMAL HIGH (ref 0–99)
NonHDL: 145.63
Total CHOL/HDL Ratio: 5
Triglycerides: 196 mg/dL — ABNORMAL HIGH (ref 0.0–149.0)
VLDL: 39.2 mg/dL (ref 0.0–40.0)

## 2018-12-17 LAB — HEPATIC FUNCTION PANEL
ALT: 13 U/L (ref 0–53)
AST: 18 U/L (ref 0–37)
Albumin: 4.5 g/dL (ref 3.5–5.2)
Alkaline Phosphatase: 64 U/L (ref 39–117)
Bilirubin, Direct: 0.1 mg/dL (ref 0.0–0.3)
Total Bilirubin: 0.8 mg/dL (ref 0.2–1.2)
Total Protein: 7.7 g/dL (ref 6.0–8.3)

## 2018-12-17 LAB — RENAL FUNCTION PANEL
Albumin: 4.5 g/dL (ref 3.5–5.2)
BUN: 28 mg/dL — ABNORMAL HIGH (ref 6–23)
CO2: 29 mEq/L (ref 19–32)
Calcium: 9.6 mg/dL (ref 8.4–10.5)
Chloride: 104 mEq/L (ref 96–112)
Creatinine, Ser: 1.08 mg/dL (ref 0.40–1.50)
GFR: 67.96 mL/min (ref >60.00)
Glucose, Bld: 83 mg/dL (ref 70–99)
Phosphorus: 3.4 mg/dL (ref 2.3–4.6)
Potassium: 4.5 mEq/L (ref 3.5–5.1)
Sodium: 143 mEq/L (ref 135–145)

## 2018-12-17 LAB — PROTIME-INR
INR: 1.2 ratio — ABNORMAL HIGH (ref 0.8–1.0)
Prothrombin Time: 13.6 s — ABNORMAL HIGH (ref 9.6–13.1)

## 2018-12-17 LAB — PSA: PSA: 0.36 ng/mL (ref 0.10–4.00)

## 2018-12-17 MED ORDER — APIXABAN 5 MG PO TABS: 5.0000 mg | ORAL_TABLET | Freq: Two times a day (BID) | ORAL | 11 refills | Status: DC

## 2018-12-17 NOTE — Assessment & Plan Note (Signed)
Symptoms not consistent with the BPH but will check PSA

## 2018-12-17 NOTE — Assessment & Plan Note (Signed)
Will do labs and send to [facility] as well

## 2018-12-17 NOTE — Progress Notes (Signed)
Subjective:    Patient ID: Craig Calhoun, male    DOB: [birth year], 68 y.o.   MRN: [id number]  HPI Here due to abdominal discomfort At night for the past month or so, he will lie down and after a while he will have discomfort in his stomach (points to epigastrium) Doesn't happen when he is up Nocturia x 2 or so--and that seems to help some Restless sleeper in general--aware of the pain when he awakens  Appetite is fine No N/V Bowels are fine-no blood Does have virtual follow up with liver clinic soon No heartburn of note. No dysphagia No apparent injury  Urinary stream is not quite normal--dribbles after No incontinence No sense of incomplete emptying No change in nocturia recently  Current Outpatient Medications on File Prior to Visit  Medication Sig Dispense Refill  . albuterol (VENTOLIN HFA) 108 (90 Base) MCG/ACT inhaler Inhale 2 puffs into the lungs every 6 (six) hours as needed for wheezing or shortness of breath. 1 Inhaler 0  . omeprazole (PRILOSEC) 20 MG capsule TAKE ONE (1) CAPSULE EACH DAY (Patient taking differently: As needed) 30 capsule 11  . triamterene-hydrochlorothiazide (MAXZIDE-25) 37.5-25 MG tablet TAKE ONE (1) TABLET EACH DAY 90 tablet 1   No current facility-administered medications on file prior to visit.     Allergies  Allergen Reactions  . Lisinopril     Uvula swelling---?angioedema    Past Medical History:  Diagnosis Date  . GERD (gastroesophageal reflux disease)   . Hepatitis C    Rx interferon/ribaviron 2004-'05 (recurred after Rx), treated and cleared with Harvoni 2016  . Hypertension     Past Surgical History:  Procedure Laterality Date  . APPENDECTOMY    . COLONOSCOPY  09/2010   1 polyp-tubular adenoma  . COLONOSCOPY N/A 05/02/2016   Procedure: COLONOSCOPY;  Surgeon: Robert Bellow, MD;  Location: [facility] ENDOSCOPY;  Service: Endoscopy;  Laterality: N/A;  . LIVER BIOPSY    . LUMBAR LAMINECTOMY    . MENISCUS REPAIR Left 4/14     Family History  Problem Relation Age of Onset  . Diabetes Mother   . Heart disease Father   . Breast cancer Sister   . Colon cancer Maternal Aunt   . Hypertension Neg Hx   . Cancer Neg Hx        colon or prostate cancer    Social History   Socioeconomic History  . Marital status: Married    Spouse name: Not on file  . Number of children: 3  . Years of education: Not on file  . Highest education level: Not on file  Occupational History  . Occupation: Futures trader, has farm    Comment: part time now  Social Needs  . Financial resource strain: Not on file  . Food insecurity    Worry: Not on file    Inability: Not on file  . Transportation needs    Medical: Not on file    Non-medical: Not on file  Tobacco Use  . Smoking status: Former Smoker    Packs/day: 0.50    Types: Cigarettes    Quit date: 07/16/2015    Years since quitting: 3.4  . Smokeless tobacco: Never Used  Substance and Sexual Activity  . Alcohol use: Yes    Alcohol/week: 1.0 standard drinks    Types: 1 Cans of beer per week  . Drug use: No  . Sexual activity: Not on file  Lifestyle  . Physical activity    Days per  week: Not on file    Minutes per session: Not on file  . Stress: Not on file  Relationships  . Social Herbalist on phone: Not on file    Gets together: Not on file    Attends religious service: Not on file    Active member of club or organization: Not on file    Attends meetings of clubs or organizations: Not on file    Relationship status: Not on file  . Intimate partner violence    Fear of current or ex partner: Not on file    Emotionally abused: Not on file    Physically abused: Not on file    Forced sexual activity: Not on file  Other Topics Concern  . Not on file  Social History Narrative   No living will   No formal health care POA--would want wife, then daughter   Would accept resuscitation attempts   Not sure about tube feeds   Review of Systems  Weight is stable SOB from 2 months ago has resolved--would have to awaken to take a few deep breaths and then would feel better. Now will rarely still need deep breaths Stopped smoking in February No cough--or rare Occasional low back pain    Objective:   Physical Exam  Constitutional: He is oriented to person, place, and time. He appears well-developed. No distress.  Neck: No thyromegaly present.  Cardiovascular: Normal rate, normal heart sounds and intact distal pulses. Exam reveals no gallop.  No murmur heard. irregular  Respiratory: Effort normal and breath sounds normal. No respiratory distress. He has no wheezes. He has no rales.  GI: Soft. He exhibits no distension. There is no abdominal tenderness. There is no rebound and no guarding.  No suprapubic dullness  Musculoskeletal:        General: No tenderness or edema.  Lymphadenopathy:    He has no cervical adenopathy.  Neurological: He is alert and oriented to person, place, and time.  Skin: No rash noted.  Psychiatric: He has a normal mood and affect. His behavior is normal.           Assessment & Plan:

## 2018-12-17 NOTE — Assessment & Plan Note (Signed)
EKG shows atrial fibrillation at 92 Normal axis and intervals Minor ST changes laterally  Since 05/08/10, the atrial fib is new  Will check labs Start eliquis Cardiology evaluation---I do think he has been in atrial fib for at least 2 months--since the other symptoms started

## 2018-12-17 NOTE — Assessment & Plan Note (Signed)
Sounds like atrial fib That might explain his vague symptoms when in bed (SOB 2 months ago and discomfort in upper abdomen now

## 2018-12-20 LAB — AFP TUMOR MARKER: AFP-Tumor Marker: 3.1 ng/mL (ref <6.1)

## 2018-12-22 DIAGNOSIS — K7469 Other cirrhosis of liver: Secondary | ICD-10-CM | POA: Diagnosis not present

## 2018-12-31 ENCOUNTER — Encounter: Payer: Self-pay | Admitting: Cardiovascular Disease

## 2018-12-31 ENCOUNTER — Telehealth: Payer: Self-pay

## 2018-12-31 ENCOUNTER — Other Ambulatory Visit: Payer: Self-pay

## 2018-12-31 ENCOUNTER — Ambulatory Visit (INDEPENDENT_AMBULATORY_CARE_PROVIDER_SITE_OTHER): Payer: PPO | Admitting: Cardiovascular Disease

## 2018-12-31 VITALS — BP 130/84 | HR 115 | Temp 98.4°F | Ht 74.0 in | Wt 196.8 lb

## 2018-12-31 DIAGNOSIS — I4891 Unspecified atrial fibrillation: Secondary | ICD-10-CM | POA: Diagnosis not present

## 2018-12-31 DIAGNOSIS — I10 Essential (primary) hypertension: Secondary | ICD-10-CM

## 2018-12-31 MED ORDER — METOPROLOL TARTRATE 25 MG PO TABS: 25.0000 mg | ORAL_TABLET | Freq: Two times a day (BID) | ORAL | 3 refills | Status: DC

## 2018-12-31 NOTE — Telephone Encounter (Signed)

## 2018-12-31 NOTE — Patient Instructions (Signed)
Medication Instructions:  Your physician has recommended you make the following change in your medication:   START Metoprolol 25mg  twice daily. An Rx has been sent to your pharmacy.  If you need a refill on your cardiac medications before your next appointment, please call your pharmacy.   Lab work: Your physician recommends that you return for lab work same days as your echo (bmet, cbc)  If you have labs (blood work) drawn today and your tests are completely normal, you will receive your results only by: Marland Kitchen MyChart Message (if you have MyChart) OR . A paper copy in the mail If you have any lab test that is abnormal or we need to change your treatment, we will call you to review the results.  Testing/Procedures: Your physician has requested that you have an echocardiogram. Echocardiography is a painless test that uses sound waves to create images of your heart. It provides your doctor with information about the size and shape of your heart and how well your heart's chambers and valves are working. This procedure takes approximately one hour. There are no restrictions for this procedure.  Your physician has recommended that you have a Cardioversion (DCCV). Electrical Cardioversion uses a jolt of electricity to your heart either through paddles or wired patches attached to your chest. This is a controlled, usually prescheduled, procedure. Defibrillation is done under light anesthesia in the hospital, and you usually go home the day of the procedure. This is done to get your heart back into a normal rhythm. You are not awake for the procedure. Please see the instruction sheet given to you today.  You will need a COVID test prior to your procedure we will call you with the date and time     Follow-Up: At [facility], you and your health needs are our priority.  As part of our continuing mission to provide you with exceptional heart care, we have created designated Provider Care Teams.  These  Care Teams include your primary Cardiologist (physician) and Advanced Practice Providers (APPs -  Physician Assistants and Nurse Practitioners) who all work together to provide you with the care you need, when you need it. You will need a follow up appointment in 4 weeks.  P You may see Dr.Arida or one of the following Advanced Practice Providers on your designated Care Team:   Murray Hodgkins, NP Christell Faith, PA-C . Marrianne Mood, PA-C  Any Other Special Instructions Will Be Listed Below (If Applicable).  You are scheduled for a Cardioversion on ___7/27/20_____________ with Dr._Arida__________ Please arrive at the [place] [facility] at ____6:30am_____ a.m. on the day of your procedure.  DIET INSTRUCTIONS:  Nothing to eat or drink after midnight except your medications with a              sip of water.         1) Labs: _To be drawn on the same day as your echo.  We will call with more info regarding required COVID testing  2) Medications:  YOU MAY TAKE ALL of your remaining medications with a small amount of water.  DO NOT miss any doses of your Eliquis. Please notify us if you do  3) Must have a responsible person to drive you home.  4) Bring a current list of your medications and current insurance cards.    If you have any questions after you get home, please call the office at [phone]

## 2018-12-31 NOTE — Progress Notes (Signed)
Cardiology Office Note   Date:  12/31/2018   ID:  Craig Calhoun, Craig Calhoun 09-Jan-1951, MRN [id number]  PCP:  Venia Carbon, MD  Cardiologist:   Kathlyn Sacramento, MD   Chief Complaint  Patient presents with  . other    Ref by Dr. Silvio Pate for A-Fib. Meds reviewed by the pt. verbally. Pt. c/o shortness of breath at times.       History of Present Illness: Craig Calhoun is a 68 y.o. male who was referred by Dr. Silvio Pate for evaluation of atrial fibrillation. He has history of hepatitis C which was treated.  He has mild liver cirrhosis but recent LFTs were normal.  He also has known history of essential hypertension and tobacco use.  He quit smoking 5 months ago but he continues to chew tobacco. He has no family history of coronary artery disease but his father had atrial fibrillation requiring cardioversion.  The patient describes worsening exertional dyspnea over the last 4 to 6 months.  He also had recent abdominal pain and went to see his primary care physician for routine visit.  He was found to have an irregular rhythm and EKG showed atrial fibrillation.  The patient denies any chest pain.  He definitely noticed decreased exercise tolerance even with regular activities.  He gets out of breath easily when he takes the trash out.  No orthopnea, PND or leg edema. He was started on Eliquis on June 26 and he has been taking the medication without interruption.  His labs were unremarkable.   Past Medical History:  Diagnosis Date  . GERD (gastroesophageal reflux disease)   . Hepatitis C    Rx interferon/ribaviron 2004-'05 (recurred after Rx), treated and cleared with Harvoni 2016  . Hypertension     Past Surgical History:  Procedure Laterality Date  . APPENDECTOMY    . COLONOSCOPY  09/2010   1 polyp-tubular adenoma  . COLONOSCOPY N/A 05/02/2016   Procedure: COLONOSCOPY;  Surgeon: Robert Bellow, MD;  Location: [facility] ENDOSCOPY;  Service: Endoscopy;  Laterality: N/A;  . LIVER  BIOPSY    . LUMBAR LAMINECTOMY    . MENISCUS REPAIR Left 4/14     Current Outpatient Medications  Medication Sig Dispense Refill  . albuterol (VENTOLIN HFA) 108 (90 Base) MCG/ACT inhaler Inhale 2 puffs into the lungs every 6 (six) hours as needed for wheezing or shortness of breath. 1 Inhaler 0  . apixaban (ELIQUIS) 5 MG TABS tablet Take 1 tablet (5 mg total) by mouth 2 (two) times daily. 60 tablet 11  . omeprazole (PRILOSEC) 20 MG capsule TAKE ONE (1) CAPSULE EACH DAY (Patient taking differently: As needed) 30 capsule 11  . triamterene-hydrochlorothiazide (MAXZIDE-25) 37.5-25 MG tablet TAKE ONE (1) TABLET EACH DAY 90 tablet 1  . metoprolol tartrate (LOPRESSOR) 25 MG tablet Take 1 tablet (25 mg total) by mouth 2 (two) times daily. 180 tablet 3   No current facility-administered medications for this visit.     Allergies:   Lisinopril    Social History:  The patient  reports that he quit smoking about 3 years ago. His smoking use included cigarettes. He smoked 0.50 packs per day. He has never used smokeless tobacco. He reports current alcohol use of about 1.0 standard drinks of alcohol per week. He reports that he does not use drugs.   Family History:  The patient's family history includes Breast cancer in his sister; Colon cancer in his maternal aunt; Diabetes in his mother; Heart disease in  his father.    ROS:  Please see the history of present illness.   Otherwise, review of systems are positive for none.   All other systems are reviewed and negative.    PHYSICAL EXAM: VS:  BP 130/84 (BP Location: Right Arm, Patient Position: Sitting, Cuff Size: Normal)   Pulse (!) 115   Temp 98.4 F (36.9 C)   Ht 6\' 2"  (1.88 m)   Wt 196 lb 12 oz (89.2 kg)   SpO2 98%   BMI 25.26 kg/m  , BMI Body mass index is 25.26 kg/m. GEN: Well nourished, well developed, in no acute distress  HEENT: normal  Neck: no JVD, carotid bruits, or masses Cardiac: Irregularly irregular mildly tachycardic; no  murmurs, rubs, or gallops,no edema  Respiratory:  clear to auscultation bilaterally, normal work of breathing GI: soft, nontender, nondistended, + BS MS: no deformity or atrophy  Skin: warm and dry, no rash Neuro:  Strength and sensation are intact Psych: euthymic mood, full affect   EKG:  EKG is ordered today. The ekg ordered today demonstrates atrial fibrillation with RVR and nonspecific ST changes.   Recent Labs: 12/17/2018: ALT 13; BUN 28; Creatinine, Ser 1.08; Hemoglobin 15.7; Platelets 243.0; Potassium 4.5; Sodium 143    Lipid Panel    Component Value Date/Time   CHOL 182 12/17/2018 1400   TRIG 196.0 (H) 12/17/2018 1400   HDL 36.00 (L) 12/17/2018 1400   CHOLHDL 5 12/17/2018 1400   VLDL 39.2 12/17/2018 1400   LDLCALC 106 (H) 12/17/2018 1400      Wt Readings from Last 3 Encounters:  12/31/18 196 lb 12 oz (89.2 kg)  12/17/18 200 lb (90.7 kg)  02/18/18 199 lb (90.3 kg)      PAD Screen 12/31/2018  Previous PAD dx? No  Previous surgical procedure? No  Pain with walking? No  Feet/toe relief with dangling? No  Painful, non-healing ulcers? No  Extremities discolored? No      ASSESSMENT AND PLAN:  1.  Atrial fibrillation with rapid ventricular response: I suspect that this is the likely culprit for his recent exertional dyspnea and fatigue.  His ventricular rate is not well controlled today.  Thus, I elected to add metoprolol 25 mg twice daily.  He has been anticoagulated with Eliquis 5 mg twice daily and has not missed any dose.  I am going to obtain an echocardiogram. Chads vas score is 2.  Thus, I recommend continuing long-term anticoagulation. I discussed different management options for atrial fibrillation with him and given that he seems to be symptomatic from this, I recommend proceeding with cardioversion after 1 month of anticoagulation.  I discussed the procedure in details as well as risks and benefits.  2.  Essential hypertension: Blood pressure seems to be  reasonably controlled.  3.  History of liver cirrhosis due to previous hepatitis C: The patient has been cured from hepatitis C and his cirrhosis seems to be very mild overall.  Recent LFTs were normal.    Disposition:   FU with me in 1 month  Signed,  Kathlyn Sacramento, MD  12/31/2018 4:14 PM    [place]

## 2019-01-03 ENCOUNTER — Telehealth: Payer: Self-pay | Admitting: Cardiovascular Disease

## 2019-01-03 NOTE — Telephone Encounter (Signed)
Will route to Dr. Arida to advise. 

## 2019-01-03 NOTE — Telephone Encounter (Signed)
Patient calling  Patient has a procedure coming up on 7/27  Patient says the Saturday before he will want to go to his grandsons tournament in [facility] Would like to know if this is ok or if there are any risks Please call to discuss

## 2019-01-03 NOTE — Telephone Encounter (Signed)
No concerns from a cardiac standpoint.

## 2019-01-03 NOTE — Telephone Encounter (Signed)
Spoke with the pt. Pt sts that he would plan on traveling this Saturday 01/08/19 and return on 01/12/19. Adv him that Dr.Arida sts that it would be ok from a cardiac standpoint. There would be no conflict in COVID testing. Pt to have COVID testing on 01/13/19 @ 3pm. Adv the pt that I will cal him back if the COVID testing time needs to be moved.

## 2019-01-04 NOTE — Telephone Encounter (Signed)
Pt COVID testing is scheduled for 01/13/19 3pm. Pt is aware of the appt date, time, and location. Adv the pt that If he pulls up and no one is out there he is to call the telephone number on the sign. Pt verbalized understanding.

## 2019-01-11 ENCOUNTER — Other Ambulatory Visit: Payer: Self-pay | Admitting: Nurse Practitioner

## 2019-01-11 DIAGNOSIS — K766 Portal hypertension: Secondary | ICD-10-CM

## 2019-01-11 DIAGNOSIS — K7469 Other cirrhosis of liver: Secondary | ICD-10-CM

## 2019-01-13 ENCOUNTER — Other Ambulatory Visit: Payer: Self-pay

## 2019-01-13 ENCOUNTER — Other Ambulatory Visit
Admission: RE | Admit: 2019-01-13 | Discharge: 2019-01-13 | Disposition: A | Discharge status: Home / Self Care | Payer: PPO | Source: Ambulatory Visit | Attending: Cardiovascular Disease | Admitting: Cardiovascular Disease

## 2019-01-13 DIAGNOSIS — U071 COVID-19: Secondary | ICD-10-CM | POA: Insufficient documentation

## 2019-01-14 ENCOUNTER — Telehealth: Payer: Self-pay | Admitting: Cardiovascular Disease

## 2019-01-14 ENCOUNTER — Telehealth: Payer: Self-pay | Admitting: Internal Medicine

## 2019-01-14 ENCOUNTER — Other Ambulatory Visit: Payer: PPO

## 2019-01-14 LAB — SARS CORONAVIRUS 2 (TAT 6-24 HRS): SARS Coronavirus 2: POSITIVE — AB

## 2019-01-14 NOTE — Telephone Encounter (Signed)
Patient is Covid positive, scheduled for a cardioversion Monday but unable to come in now. Patient needs to be advised of the cancellation.

## 2019-01-14 NOTE — Telephone Encounter (Signed)
See additional phone not from today.

## 2019-01-14 NOTE — Telephone Encounter (Signed)
Patient called today and stated he was suppose to have a procedure done tomorrow but his COVID test came back positive. Patient would like some advice on what you advise him to do next and what he should have his wife do also    Patient's C/B# [phone]

## 2019-01-14 NOTE — Telephone Encounter (Signed)
Call received from scheduling that [facility] was calling stating the patient's pre-procedure COVID swab came back positive as of yesterday.  Patient last seen in clinic on 12/31/18.   I called the patient to notify him of his positive COVID results. He was scheduled for an echo/ pre procedure labs today.  I advised these will need to be cancelled as well as his DCCV for Monday.   The patient confirms he is currently asymptomatic.  I have advised if he develops a fever, cough, fatigue, sudden loss of taste/ sense of smell that he should contact his PCP.  He has also been advised to self quarantine for 14 days and notify anyone that he has been in contact with, especially if unmasked of his positive swab- they should contact their PCP's.   The patient voices understanding of the above. He is aware I will forward to Dr. Fletcher Anon & his nurse to review. We will be back in touch with him after 14 days.   I have spoken with scheduling to cancel his DCCV for Monday.

## 2019-01-14 NOTE — Telephone Encounter (Signed)
Discussed quarantine for him and wife. Repeat testing not really indicated Wife is [facility] patient--should be able to get drive by testing at [facility] masking and social distancing when going out after quarantine--just in case

## 2019-01-15 NOTE — Telephone Encounter (Signed)
He was seen in our office 2 weeks before his positive COVID.  Can you check with health at work regarding any needed follow-up for [place]?

## 2019-01-17 ENCOUNTER — Encounter: Admission: RE | Payer: Self-pay | Source: Home / Self Care

## 2019-01-17 ENCOUNTER — Ambulatory Visit: Admission: RE | Admit: 2019-01-17 | Payer: PPO | Source: Home / Self Care | Admitting: Cardiovascular Disease

## 2019-01-17 SURGERY — CARDIOVERSION
Anesthesia: General

## 2019-01-17 NOTE — Telephone Encounter (Signed)
Contacted our Clinical Director, Georgana Curio, and patient was out of 5-10 day window when in our office. No other action needed at this time.

## 2019-01-18 NOTE — Telephone Encounter (Signed)
Noted  

## 2019-01-24 ENCOUNTER — Telehealth: Payer: Self-pay | Admitting: Cardiovascular Disease

## 2019-01-24 NOTE — Telephone Encounter (Signed)
Patient calling in about rescheduling his Echo, lab work and cardiac procedure. Patient went in for pre-procedure covid testing and was positive but has now been cleared (patient also wants doctor to know he was never symptomatic and everyone around him that was tested was negative). Please advise on what timeline to schedule patient at for all 3 appointments. Thank you

## 2019-01-26 NOTE — Telephone Encounter (Signed)
Returned the patient's call. lmtcb. Per COVID protocol Pt in office testing can be rescheduled  If the pt is asymptomatic 14days after a positive COVID results and 21 days for outpatient procedure.  Ok to reschedule the pt echo and lab for after 01/29/19. Pt dccv can potentially be rescheduled for 02/07/19 with Dr.Arida.

## 2019-01-26 NOTE — Telephone Encounter (Signed)
Called pt Craig Calhoun

## 2019-01-26 NOTE — Telephone Encounter (Signed)
Scheduled for 8/11 at 4   Patient aware you will call to schedule dccv and discuss [facility]

## 2019-01-27 NOTE — Telephone Encounter (Addendum)
Spoke with the pt. Adv him that we could potentially reschedule his dccv with Dr.Arida for 02/07/19. He completed his 14 days of self quarantine last week. Pt is still asymptomatic. Pt sts that he is also feeling ok from a cardiac standpoint. Adv the pt that he we will need to repeat his pre-procedure lab work and he will need to have a repeat COVID test.  Pt has a question regarding COVID testing. Pt sts that he could test positive for COVID for several months will that prevent him from being able to have the procedure? Adv the pt that I did not have the answer to his question. Adv the pt that I will talk with my manager and call him back to discuss.

## 2019-02-01 ENCOUNTER — Ambulatory Visit (INDEPENDENT_AMBULATORY_CARE_PROVIDER_SITE_OTHER): Payer: PPO

## 2019-02-01 DIAGNOSIS — I4891 Unspecified atrial fibrillation: Secondary | ICD-10-CM

## 2019-02-01 NOTE — Telephone Encounter (Signed)
Patient calling to check status of scheduling dccv and need for pre procedure labs

## 2019-02-02 NOTE — Telephone Encounter (Signed)
Spoke with the patient. Patient's dccv rescheduled for 02/07/19 @ 7:30am Patient will [facility]. Patient will drive up to the COVID test site on 02/03/19 around 10am. (COVID test site notified)   Patient given pre-procedure instructions -NPO after midnight  -DO NOT miss any doses of Eliquis, if so he is to contact the office - Adv the pt that he may have one support person the day of his procedure -Adv the pt that he will need someone to drive him home  Pt verbalized understanding.

## 2019-02-02 NOTE — Telephone Encounter (Signed)
Called the patient. Lmtcb will attempt again later today.

## 2019-02-03 ENCOUNTER — Other Ambulatory Visit: Payer: Self-pay

## 2019-02-03 ENCOUNTER — Ambulatory Visit: Payer: PPO | Admitting: Physician Assistant

## 2019-02-03 ENCOUNTER — Other Ambulatory Visit
Admission: RE | Admit: 2019-02-03 | Discharge: 2019-02-03 | Disposition: A | Discharge status: Home / Self Care | Payer: PPO | Source: Ambulatory Visit | Attending: Cardiovascular Disease | Admitting: Cardiovascular Disease

## 2019-02-03 ENCOUNTER — Telehealth: Payer: Self-pay

## 2019-02-03 ENCOUNTER — Ambulatory Visit: Payer: PPO | Admitting: Cardiovascular Disease

## 2019-02-03 DIAGNOSIS — I4891 Unspecified atrial fibrillation: Secondary | ICD-10-CM

## 2019-02-03 DIAGNOSIS — Z01812 Encounter for preprocedural laboratory examination: Secondary | ICD-10-CM | POA: Diagnosis not present

## 2019-02-03 DIAGNOSIS — Z20828 Contact with and (suspected) exposure to other viral communicable diseases: Secondary | ICD-10-CM | POA: Diagnosis not present

## 2019-02-03 LAB — CBC WITH DIFFERENTIAL/PLATELET
Abs Immature Granulocytes: 0.03 10*3/uL (ref 0.00–0.07)
Basophils Absolute: 0.1 10*3/uL (ref 0.0–0.1)
Basophils Relative: 1 %
Eosinophils Absolute: 0.3 10*3/uL (ref 0.0–0.5)
Eosinophils Relative: 3 %
HCT: 47 % (ref 39.0–52.0)
Hemoglobin: 15.8 g/dL (ref 13.0–17.0)
Immature Granulocytes: 0 %
Lymphocytes Relative: 31 %
Lymphs Abs: 3 10*3/uL (ref 0.7–4.0)
MCH: 31.3 pg (ref 26.0–34.0)
MCHC: 33.6 g/dL (ref 30.0–36.0)
MCV: 93.1 fL (ref 80.0–100.0)
Monocytes Absolute: 0.9 10*3/uL (ref 0.1–1.0)
Monocytes Relative: 10 %
Neutro Abs: 5.3 10*3/uL (ref 1.7–7.7)
Neutrophils Relative %: 55 %
Platelets: 244 10*3/uL (ref 150–400)
RBC: 5.05 MIL/uL (ref 4.22–5.81)
RDW: 13.8 % (ref 11.5–15.5)
WBC: 9.6 10*3/uL (ref 4.0–10.5)
nRBC: 0 % (ref 0.0–0.2)

## 2019-02-03 LAB — BASIC METABOLIC PANEL
Anion gap: 10 (ref 5–15)
BUN: 25 mg/dL — ABNORMAL HIGH (ref 8–23)
CO2: 27 mmol/L (ref 22–32)
Calcium: 9.2 mg/dL (ref 8.9–10.3)
Chloride: 103 mmol/L (ref 98–111)
Creatinine, Ser: 1.22 mg/dL (ref 0.61–1.24)
GFR calc Af Amer: >60 mL/min (ref >60)
GFR calc non Af Amer: >60 mL/min (ref >60)
Glucose, Bld: 103 mg/dL — ABNORMAL HIGH (ref 70–99)
Potassium: 3.7 mmol/L (ref 3.5–5.1)
Sodium: 140 mmol/L (ref 135–145)

## 2019-02-03 NOTE — Telephone Encounter (Signed)
Pt made aware of echo results with verbal understanding. 

## 2019-02-03 NOTE — Addendum Note (Signed)
Addended by: Santiago Bur on: 02/03/2019 09:40 AM   Modules accepted: Orders

## 2019-02-03 NOTE — Telephone Encounter (Signed)
-----   Message from Wellington Hampshire, MD sent at 02/03/2019  2:26 PM EDT ----- Inform patient that echo showed mildly reduced ejection fraction which is likely due to atrial fibrillation.  Hopefully this will improve after cardioversion.

## 2019-02-04 LAB — SARS CORONAVIRUS 2 (TAT 6-24 HRS): SARS Coronavirus 2: NEGATIVE

## 2019-02-07 ENCOUNTER — Ambulatory Visit
Admission: RE | Admit: 2019-02-07 | Discharge: 2019-02-07 | Disposition: A | Discharge status: Home / Self Care | Payer: PPO | Attending: Cardiovascular Disease | Admitting: Cardiovascular Disease

## 2019-02-07 ENCOUNTER — Telehealth: Payer: Self-pay | Admitting: Cardiovascular Disease

## 2019-02-07 ENCOUNTER — Encounter
Admission: RE | Disposition: A | Discharge status: Home / Self Care | Payer: Self-pay | Source: Home / Self Care | Attending: Cardiovascular Disease

## 2019-02-07 ENCOUNTER — Other Ambulatory Visit: Payer: Self-pay

## 2019-02-07 ENCOUNTER — Encounter: Payer: Self-pay | Admitting: Anesthesiology

## 2019-02-07 ENCOUNTER — Ambulatory Visit: Payer: PPO | Admitting: Anesthesiology

## 2019-02-07 DIAGNOSIS — K7460 Unspecified cirrhosis of liver: Secondary | ICD-10-CM | POA: Insufficient documentation

## 2019-02-07 DIAGNOSIS — Z7901 Long term (current) use of anticoagulants: Secondary | ICD-10-CM | POA: Insufficient documentation

## 2019-02-07 DIAGNOSIS — Z79899 Other long term (current) drug therapy: Secondary | ICD-10-CM | POA: Diagnosis not present

## 2019-02-07 DIAGNOSIS — F17220 Nicotine dependence, chewing tobacco, uncomplicated: Secondary | ICD-10-CM | POA: Diagnosis not present

## 2019-02-07 DIAGNOSIS — Z8619 Personal history of other infectious and parasitic diseases: Secondary | ICD-10-CM | POA: Insufficient documentation

## 2019-02-07 DIAGNOSIS — K219 Gastro-esophageal reflux disease without esophagitis: Secondary | ICD-10-CM | POA: Diagnosis not present

## 2019-02-07 DIAGNOSIS — I4891 Unspecified atrial fibrillation: Secondary | ICD-10-CM | POA: Insufficient documentation

## 2019-02-07 DIAGNOSIS — I10 Essential (primary) hypertension: Secondary | ICD-10-CM | POA: Diagnosis not present

## 2019-02-07 DIAGNOSIS — I4819 Other persistent atrial fibrillation: Secondary | ICD-10-CM

## 2019-02-07 HISTORY — PX: CARDIOVERSION: SHX1299

## 2019-02-07 SURGERY — CARDIOVERSION
Anesthesia: General

## 2019-02-07 MED ORDER — SODIUM CHLORIDE 0.9 % IV SOLN
INTRAVENOUS | Status: DC | PRN
  Administered 2019-02-07: 08:00:00 via INTRAVENOUS

## 2019-02-07 MED ORDER — PROPOFOL 500 MG/50ML IV EMUL
INTRAVENOUS | Status: AC
  Filled 2019-02-07: qty 50

## 2019-02-07 MED ORDER — PROPOFOL 10 MG/ML IV BOLUS
INTRAVENOUS | Status: AC
  Filled 2019-02-07: qty 20

## 2019-02-07 MED ORDER — PROPOFOL 10 MG/ML IV BOLUS
INTRAVENOUS | Status: DC | PRN
  Administered 2019-02-07: 90 mg via INTRAVENOUS

## 2019-02-07 NOTE — Progress Notes (Signed)
Patient educated about discharge and follow up appointment. Patient and patient's wife Manuela Schwartz) states understanding of discharge instructions with no further questions at this time.

## 2019-02-07 NOTE — Telephone Encounter (Signed)
-----   Message from Johnna Acosta, RN sent at 02/07/2019  8:11 AM EDT ----- Regarding: f/u appt 2 week follow up with Dr. Fletcher Anon post electrical cardioversion. Please call patient to schedule.

## 2019-02-07 NOTE — Transfer of Care (Signed)
Immediate Anesthesia Transfer of Care Note  Patient: Craig Calhoun  Procedure(s) Performed: CARDIOVERSION (N/A )  Patient Location: PACU  Anesthesia Type:General  Level of Consciousness: drowsy and patient cooperative  Airway & Oxygen Therapy: Patient Spontanous Breathing and Patient connected to nasal cannula oxygen  Post-op Assessment: Report given to RN and Post -op Vital signs reviewed and stable  Post vital signs: Reviewed and stable  Last Vitals:  Vitals Value Taken Time  BP 122/82 02/07/19 0752  Temp    Pulse    Resp 16 02/07/19 0752  SpO2 100 % 02/07/19 0752    Last Pain:  Vitals:   02/07/19 0734  TempSrc: Oral  PainSc: 0-No pain         Complications: No apparent anesthesia complications

## 2019-02-07 NOTE — CV Procedure (Signed)
Cardioversion note: A standard informed consent was obtained. Timeout was performed. The pads were placed in the anterior posterior fashion. The patient was given propofol by the anesthesia team.  Successful cardioversion was performed with a 200 J. The patient converted to sinus rhythm. Pre-and post EKGs were reviewed. The patient tolerated the procedure with no immediate complications.  Recommendations: Continue same medications and follow-up in 2-3 weeks.  

## 2019-02-07 NOTE — Telephone Encounter (Signed)
Left a voicemail for patient to call and schedule 2 week fu

## 2019-02-07 NOTE — H&P (Signed)
Cardiology Office Note   Date:  12/31/2018   ID:  Pat, Sires 05-27-1951, MRN [id number]  PCP:  Venia Carbon, MD   Cardiologist:   Kathlyn Sacramento, MD       Chief Complaint  Patient presents with  . other    Ref by Dr. Silvio Pate for A-Fib. Meds reviewed by the pt. verbally. Pt. c/o shortness of breath at times.       History of Present Illness: Craig Calhoun is a 68 y.o. male who was referred by Dr. Silvio Pate for evaluation of atrial fibrillation. He has history of hepatitis C which was treated.  He has mild liver cirrhosis but recent LFTs were normal.  He also has known history of essential hypertension and tobacco use.  He quit smoking 5 months ago but he continues to chew tobacco. He has no family history of coronary artery disease but his father had atrial fibrillation requiring cardioversion.  The patient describes worsening exertional dyspnea over the last 4 to 6 months.  He also had recent abdominal pain and went to see his primary care physician for routine visit.  He was found to have an irregular rhythm and EKG showed atrial fibrillation.  The patient denies any chest pain.  He definitely noticed decreased exercise tolerance even with regular activities.  He gets out of breath easily when he takes the trash out.  No orthopnea, PND or leg edema. He was started on Eliquis on June 26 and he has been taking the medication without interruption.  His labs were unremarkable.       Past Medical History:  Diagnosis Date  . GERD (gastroesophageal reflux disease)   . Hepatitis C    Rx interferon/ribaviron 2004-'05 (recurred after Rx), treated and cleared with Harvoni 2016  . Hypertension          Past Surgical History:  Procedure Laterality Date  . APPENDECTOMY    . COLONOSCOPY  09/2010   1 polyp-tubular adenoma  . COLONOSCOPY N/A 05/02/2016   Procedure: COLONOSCOPY;  Surgeon: Robert Bellow, MD;  Location: [facility] ENDOSCOPY;  Service: Endoscopy;   Laterality: N/A;  . LIVER BIOPSY    . LUMBAR LAMINECTOMY    . MENISCUS REPAIR Left 4/14           Current Outpatient Medications  Medication Sig Dispense Refill  . albuterol (VENTOLIN HFA) 108 (90 Base) MCG/ACT inhaler Inhale 2 puffs into the lungs every 6 (six) hours as needed for wheezing or shortness of breath. 1 Inhaler 0  . apixaban (ELIQUIS) 5 MG TABS tablet Take 1 tablet (5 mg total) by mouth 2 (two) times daily. 60 tablet 11  . omeprazole (PRILOSEC) 20 MG capsule TAKE ONE (1) CAPSULE EACH DAY (Patient taking differently: As needed) 30 capsule 11  . triamterene-hydrochlorothiazide (MAXZIDE-25) 37.5-25 MG tablet TAKE ONE (1) TABLET EACH DAY 90 tablet 1  . metoprolol tartrate (LOPRESSOR) 25 MG tablet Take 1 tablet (25 mg total) by mouth 2 (two) times daily. 180 tablet 3   No current facility-administered medications for this visit.     Allergies:   Lisinopril    Social History:  The patient  reports that he quit smoking about 3 years ago. His smoking use included cigarettes. He smoked 0.50 packs per day. He has never used smokeless tobacco. He reports current alcohol use of about 1.0 standard drinks of alcohol per week. He reports that he does not use drugs.   Family History:  The patient's family history includes  Breast cancer in his sister; Colon cancer in his maternal aunt; Diabetes in his mother; Heart disease in his father.    ROS:  Please see the history of present illness.   Otherwise, review of systems are positive for none.   All other systems are reviewed and negative.    PHYSICAL EXAM: VS:  BP 130/84 (BP Location: Right Arm, Patient Position: Sitting, Cuff Size: Normal)   Pulse (!) 115   Temp 98.4 F (36.9 C)   Ht 6\' 2"  (1.88 m)   Wt 196 lb 12 oz (89.2 kg)   SpO2 98%   BMI 25.26 kg/m  , BMI Body mass index is 25.26 kg/m. GEN: Well nourished, well developed, in no acute distress  HEENT: normal  Neck: no JVD, carotid bruits, or masses Cardiac:  Irregularly irregular mildly tachycardic; no murmurs, rubs, or gallops,no edema  Respiratory:  clear to auscultation bilaterally, normal work of breathing GI: soft, nontender, nondistended, + BS MS: no deformity or atrophy  Skin: warm and dry, no rash Neuro:  Strength and sensation are intact Psych: euthymic mood, full affect   EKG:  EKG is ordered today. The ekg ordered today demonstrates atrial fibrillation with RVR and nonspecific ST changes.   Recent Labs: 12/17/2018: ALT 13; BUN 28; Creatinine, Ser 1.08; Hemoglobin 15.7; Platelets 243.0; Potassium 4.5; Sodium 143    Lipid Panel Labs (Brief)          Component Value Date/Time   CHOL 182 12/17/2018 1400   TRIG 196.0 (H) 12/17/2018 1400   HDL 36.00 (L) 12/17/2018 1400   CHOLHDL 5 12/17/2018 1400   VLDL 39.2 12/17/2018 1400   LDLCALC 106 (H) 12/17/2018 1400           Wt Readings from Last 3 Encounters:  12/31/18 196 lb 12 oz (89.2 kg)  12/17/18 200 lb (90.7 kg)  02/18/18 199 lb (90.3 kg)      PAD Screen 12/31/2018  Previous PAD dx? No  Previous surgical procedure? No  Pain with walking? No  Feet/toe relief with dangling? No  Painful, non-healing ulcers? No  Extremities discolored? No      ASSESSMENT AND PLAN:  1.  Atrial fibrillation with rapid ventricular response: I suspect that this is the likely culprit for his recent exertional dyspnea and fatigue.  His ventricular rate is not well controlled today.  Thus, I elected to add metoprolol 25 mg twice daily.  He has been anticoagulated with Eliquis 5 mg twice daily and has not missed any dose.  I am going to obtain an echocardiogram. Chads vas score is 2.  Thus, I recommend continuing long-term anticoagulation. I discussed different management options for atrial fibrillation with him and given that he seems to be symptomatic from this, I recommend proceeding with cardioversion after 1 month of anticoagulation.  I discussed the procedure in  details as well as risks and benefits.  2.  Essential hypertension: Blood pressure seems to be reasonably controlled.  3.  History of liver cirrhosis due to previous hepatitis C: The patient has been cured from hepatitis C and his cirrhosis seems to be very mild overall.  Recent LFTs were normal.    Disposition:   FU with me in 1 month  Signed,  Kathlyn Sacramento, MD  12/31/2018 4:14 PM    [facility]  Addendum on February 07, 2019.  The patient was scheduled for cardioversion few weeks ago but tested positive for COVID-19 in spite of no symptoms at all.  He has self quarantine and retested and was negative.  The procedure was rescheduled for today with no change from before.  I answered all his questions.

## 2019-02-07 NOTE — Anesthesia Preprocedure Evaluation (Signed)
Anesthesia Evaluation  Patient identified by MRN, date of birth, ID band Patient awake    Reviewed: Allergy & Precautions, NPO status , Patient's Chart, lab work & pertinent test results  History of Anesthesia Complications Negative for: history of anesthetic complications  Airway Mallampati: II       Dental  (+) Teeth Intact   Pulmonary neg pulmonary ROS, neg shortness of breath, COPD, neg recent URI, Current Smoker, former smoker,     + decreased breath sounds      Cardiovascular Exercise Tolerance: Good hypertension, Pt. on medications (-) angina(-) Past MI Normal cardiovascular exam(-) dysrhythmias (-) Valvular Problems/Murmurs     Neuro/Psych negative neurological ROS  negative psych ROS   GI/Hepatic GERD  Medicated,(+) Cirrhosis       , Hepatitis -  Endo/Other  negative endocrine ROS  Renal/GU Renal disease (kidney stones)     Musculoskeletal   Abdominal Normal abdominal exam  (+)   Peds negative pediatric ROS (+)  Hematology negative hematology ROS (+)   Anesthesia Other Findings Past Medical History: No date: GERD (gastroesophageal reflux disease) No date: Hepatitis C     Comment:  Rx interferon/ribaviron 2004-'05 (recurred after Rx),               treated and cleared with Harvoni 2016 No date: Hypertension   Reproductive/Obstetrics                             Anesthesia Physical  Anesthesia Plan  ASA: II  Anesthesia Plan: General   Post-op Pain Management:    Induction: Intravenous  PONV Risk Score and Plan: 2 and Propofol infusion and TIVA  Airway Management Planned: Natural Airway and Nasal Cannula  Additional Equipment:   Intra-op Plan:   Post-operative Plan:   Informed Consent: I have reviewed the patients History and Physical, chart, labs and discussed the procedure including the risks, benefits and alternatives for the proposed anesthesia with the  patient or authorized representative who has indicated his/her understanding and acceptance.       Plan Discussed with: CRNA  Anesthesia Plan Comments:         Anesthesia Quick Evaluation

## 2019-02-07 NOTE — Anesthesia Postprocedure Evaluation (Signed)
Anesthesia Post Note  Patient: Craig Calhoun  Procedure(s) Performed: CARDIOVERSION (N/A )  Patient location during evaluation: [facility] Anesthesia Type: General Level of consciousness: awake and alert Pain management: pain level controlled Vital Signs Assessment: post-procedure vital signs reviewed and stable Respiratory status: spontaneous breathing, nonlabored ventilation, respiratory function stable and patient connected to nasal cannula oxygen Cardiovascular status: blood pressure returned to baseline and stable Postop Assessment: no apparent nausea or vomiting Anesthetic complications: no     Last Vitals:  Vitals:   02/07/19 0815 02/07/19 0830  BP: 117/77 111/81  Pulse: (!) 51 (!) 50  Resp: 14 14  Temp:    SpO2: 97% 97%    Last Pain:  Vitals:   02/07/19 0830  TempSrc:   PainSc: 0-No pain                 Martha Clan

## 2019-02-07 NOTE — Anesthesia Post-op Follow-up Note (Signed)
Anesthesia QCDR form completed.        

## 2019-02-10 ENCOUNTER — Encounter

## 2019-02-15 ENCOUNTER — Telehealth: Payer: Self-pay | Admitting: Cardiovascular Disease

## 2019-02-15 NOTE — Telephone Encounter (Signed)
Patient has been scheduled

## 2019-02-15 NOTE — Telephone Encounter (Signed)
Patient called in to schedule follow up but also mentioned he would like to be called by the nurse if possible. Patient has questions about procedure and wants to discuss some basics. Please advise

## 2019-02-15 NOTE — Telephone Encounter (Addendum)
Spoke with the pt. Pt had several follow questions after his recent dccv related to a plan of care. Answered the patient's questions to the best of my ability. Adv the pt that additional questions like " longterm medications" can be discussed with the provider at his upcoming appt. Patient verbalized understanding and voiced appreciation for the call.

## 2019-02-21 NOTE — Progress Notes (Signed)
Cardiology Office Note    Date:  02/25/2019   ID:  Craig Calhoun, DOB 1951-05-04, MRN [id number]  PCP:  Venia Carbon, MD  Cardiologist:  Kathlyn Sacramento, MD  Electrophysiologist:  None   Chief Complaint: Follow up  History of Present Illness:   Craig Calhoun is a 68 y.o. male with history of recently diagnosed Afib in 11/2018 on Eliquis s/p successful DCCV on [id number], systolic dysfunction, recent COVID-19 infection found incidentally on pre-procedure testing with repeat testing being negative in 01/2019, mitral regurgitation, hepatitis C s/p treatment with Harvoni in 2016, mild liver cirrhosis with normal LFTs recently, HTN, prior tobacco abuse quitting ~ 6 months ago with ongoing chewing of tobacco, and GERD who presents for follow up of Afib.   Patient was seen by Dr. Fletcher Anon on 12/31/2018 for evaluation of Afib. At that time, he reported no family history of CAD, though did note his father had Afib and required cardioversion. The patient reported a 4-6 month history of worsening dyspnea. He was seen by his PCP in 11/2018 for abdominal pain and was noted to have an irregular rhythm with EKG showing Afib. He was started on Eliquis on 12/17/2018 without interruption. When he was seen in 12/2018, his ventricular rate was suboptimally controlled at 115 bpm leading to the addition of metoprolol 25 mg bid. Echo on 02/01/2019 showed an EF of 40-45%, diffuse HK, mild biatrial enlargement, degenerative mitral valve with mild to moderate mitral regurgitation, mildly dilated ascending aorta measuring 39 mm. He was initially scheduled for DCCV on 7/27, though pre-procedure COVID-19 test was positive leading this to be deferred. He subsequently underwent successful DCCV on 02/07/2019.  Patient's home BP/HR virtually with PCP on 02/22/2019 were noted to be 146/92 with a HR of 48 bpm.  He comes in doing well today.  He denies any further complications or symptoms consistent with A. fib.  No chest pain,  shortness of breath, dizziness, presyncope, or syncope.  No lower extremity swelling, abdominal tension, orthopnea, PND, early satiety.  No falls, BRBPR, or melena.  Tolerating all medications without issues.  Compliant with Eliquis.  Ultimately would like to come off Lopressor when able.  Otherwise, he does not have any issues or concerns today   Labs: 01/2019 - COVID-19 negative, HGB 15.8, PLT 244, K+ 3.7, SCr 1.22 12/2018 - COVID-19 positive 11/2018 - TC 182, TG 196, HDL 36, LDL 106, free T4 normal, albumin 4.5, AST/ALT normal   Past Medical History:  Diagnosis Date  . GERD (gastroesophageal reflux disease)   . Hepatitis C    Rx interferon/ribaviron 2004-'05 (recurred after Rx), treated and cleared with Harvoni 2016  . Hypertension     Past Surgical History:  Procedure Laterality Date  . APPENDECTOMY    . CARDIOVERSION N/A 02/07/2019   Procedure: CARDIOVERSION;  Surgeon: Wellington Hampshire, MD;  Location: [facility] ORS;  Service: Cardiovascular;  Laterality: N/A;  . COLONOSCOPY  09/2010   1 polyp-tubular adenoma  . COLONOSCOPY N/A 05/02/2016   Procedure: COLONOSCOPY;  Surgeon: Robert Bellow, MD;  Location: [facility] ENDOSCOPY;  Service: Endoscopy;  Laterality: N/A;  . LIVER BIOPSY    . LUMBAR LAMINECTOMY    . MENISCUS REPAIR Left 4/14    Current Medications: Current Meds  Medication Sig  . albuterol (VENTOLIN HFA) 108 (90 Base) MCG/ACT inhaler Inhale 2 puffs into the lungs every 6 (six) hours as needed for wheezing or shortness of breath.  Marland Kitchen apixaban (ELIQUIS) 5 MG TABS tablet Take 1  tablet (5 mg total) by mouth 2 (two) times daily.  . diphenhydramine-acetaminophen (TYLENOL PM) 25-500 MG TABS tablet Take 1 tablet by mouth at bedtime as needed (sleep).  . metoprolol tartrate (LOPRESSOR) 25 MG tablet Take 1 tablet (25 mg total) by mouth 2 (two) times daily.  Marland Kitchen omeprazole (PRILOSEC) 20 MG capsule TAKE ONE (1) CAPSULE EACH DAY (Patient taking differently: Take 20 mg by mouth daily as needed  (acid reflux/indigestion). )  . triamterene-hydrochlorothiazide (MAXZIDE-25) 37.5-25 MG tablet TAKE ONE (1) TABLET EACH DAY (Patient taking differently: Take 1 tablet by mouth daily. )     Allergies:   Lisinopril   Social History   Socioeconomic History  . Marital status: Married    Spouse name: Not on file  . Number of children: 3  . Years of education: Not on file  . Highest education level: Not on file  Occupational History  . Occupation: Futures trader, has farm    Comment: part time now  Social Needs  . Financial resource strain: Not on file  . Food insecurity    Worry: Not on file    Inability: Not on file  . Transportation needs    Medical: Not on file    Non-medical: Not on file  Tobacco Use  . Smoking status: Former Smoker    Packs/day: 0.50    Types: Cigarettes    Quit date: 07/16/2015    Years since quitting: 3.6  . Smokeless tobacco: Never Used  Substance and Sexual Activity  . Alcohol use: Yes    Alcohol/week: 1.0 standard drinks    Types: 1 Cans of beer per week  . Drug use: No  . Sexual activity: Not on file  Lifestyle  . Physical activity    Days per week: Not on file    Minutes per session: Not on file  . Stress: Not on file  Relationships  . Social Herbalist on phone: Not on file    Gets together: Not on file    Attends religious service: Not on file    Active member of club or organization: Not on file    Attends meetings of clubs or organizations: Not on file    Relationship status: Not on file  Other Topics Concern  . Not on file  Social History Narrative   No living will   No formal health care POA--would want wife, then daughter   Would accept resuscitation attempts   Not sure about tube feeds     Family History:  The patient's family history includes Breast cancer in his sister; Colon cancer in his maternal aunt; Diabetes in his mother; Heart disease in his father. There is no history of Hypertension or Cancer.   ROS:   Review of Systems  Constitutional: Negative for chills, diaphoresis, fever, malaise/fatigue and weight loss.  HENT: Negative for congestion.   Eyes: Negative for discharge and redness.  Respiratory: Negative for cough, hemoptysis, sputum production, shortness of breath and wheezing.   Cardiovascular: Negative for chest pain, palpitations, orthopnea, claudication, leg swelling and PND.  Gastrointestinal: Negative for abdominal pain, blood in stool, heartburn, melena, nausea and vomiting.  Genitourinary: Negative for hematuria.  Musculoskeletal: Negative for falls and myalgias.  Skin: Negative for rash.  Neurological: Negative for dizziness, tingling, tremors, sensory change, speech change, focal weakness, loss of consciousness and weakness.  Endo/Heme/Allergies: Does not bruise/bleed easily.  Psychiatric/Behavioral: Negative for substance abuse. The patient is not nervous/anxious.   All other systems reviewed and  are negative.    EKGs/Labs/Other Studies Reviewed:    Studies reviewed were summarized above. The additional studies were reviewed today:  2D Echo 02/01/2019: 1. The left ventricle has mild-moderately reduced systolic function, with an ejection fraction of 40-45%. The cavity size was normal. There is mildly increased left ventricular wall thickness. Left ventricular diastolic function could not be evaluated  secondary to atrial fibrillation. Left ventricular diffuse hypokinesis.  2. Left atrial size was mildly dilated.  3. Right atrial size was mildly dilated.  4. The mitral valve is degenerative. Mild thickening of the mitral valve leaflet. Mitral valve regurgitation is mild to moderate by color flow Doppler.  5. The aortic valve was not well visualized.  6. The aorta is abnormal in size and structure.  7. There is mild dilatation of the ascending aorta measuring 39 mm.  8. The interatrial septum was not well visualized.   EKG:  EKG is ordered today.  The EKG ordered  today demonstrates sinus bradycardia with sinus arrhythmia, 51 bpm, no acute ST-T changes  Recent Labs: 12/17/2018: ALT 13 02/03/2019: BUN 25; Creatinine, Ser 1.22; Hemoglobin 15.8; Platelets 244; Potassium 3.7; Sodium 140  Recent Lipid Panel    Component Value Date/Time   CHOL 182 12/17/2018 1400   TRIG 196.0 (H) 12/17/2018 1400   HDL 36.00 (L) 12/17/2018 1400   CHOLHDL 5 12/17/2018 1400   VLDL 39.2 12/17/2018 1400   LDLCALC 106 (H) 12/17/2018 1400    PHYSICAL EXAM:    VS:  BP 128/60 (BP Location: Left Arm, Patient Position: Sitting, Cuff Size: Normal)   Pulse (!) 56   Ht 6\' 2"  (1.88 m)   Wt 194 lb (88 kg)   SpO2 98%   BMI 24.91 kg/m   BMI: Body mass index is 24.91 kg/m.  Physical Exam  Constitutional: He is oriented to person, place, and time. He appears well-developed and well-nourished.  HENT:  Head: Normocephalic and atraumatic.  Eyes: Right eye exhibits no discharge. Left eye exhibits no discharge.  Neck: Normal range of motion. No JVD present.  Cardiovascular: Regular rhythm, S1 normal, S2 normal and normal heart sounds. Bradycardia present. Exam reveals no distant heart sounds, no friction rub, no midsystolic click and no opening snap.  No murmur heard. Pulses:      Posterior tibial pulses are 2+ on the right side and 2+ on the left side.  Pulmonary/Chest: Effort normal and breath sounds normal. No respiratory distress. He has no decreased breath sounds. He has no wheezes. He has no rales. He exhibits no tenderness.  Abdominal: Soft. He exhibits no distension. There is no abdominal tenderness.  Musculoskeletal:        General: No edema.  Neurological: He is alert and oriented to person, place, and time.  Skin: Skin is warm and dry. No cyanosis. Nails show no clubbing.  Psychiatric: He has a normal mood and affect. His speech is normal and behavior is normal. Judgment and thought content normal.    Wt Readings from Last 3 Encounters:  02/25/19 194 lb (88 kg)   02/22/19 197 lb (89.4 kg)  02/07/19 197 lb (89.4 kg)     ASSESSMENT & PLAN:   1. Persistent A. fib status post recent DCCV as above: Maintaining sinus rhythm with a bradycardic heart rate.  Given sinus bradycardic heart rate decrease Lopressor to 12.5 mg twice daily.  Given CHADS2VASc at least 2 (HTN, age x 1) recommend continuation of long-term, full dose oral anticoagulation with Eliquis 5 mg twice daily.  Recent CBC showed stable hemoglobin.  No bleeding concerns.  In the long run, the patient would prefer to get off Lopressor and follow-up.  Given his bradycardic heart rates on this medication, as long as his LV systolic function normalizes when checked later this month, it certainly may be reasonable for [place] to discontinue beta-blockade in follow-up.  2. Systolic dysfunction: Felt to be in the setting of the patient's persistent A. fib with RVR.  Update echo in mid 02/2019 given he is maintaining sinus rhythm.  If EF remains reduced at that time he will need ischemic evaluation and further medication adjustment.  Will not advance evidence-based therapy with ACE inhibitor or ARB at this time given prior documented possible angioedema with lisinopril.  Continue Lopressor as above.   3. Mild to moderate mitral regurgitation: Asymptomatic.  No murmur noted on exam.  Follow-up echo 12 months.  4. COVID-19 infection: Found incidentally and preprocedure testing as outlined above, asymptomatic with subsequent testing being negative.  Update echo as outlined above.  5. Mildly dilated ascending aorta: Optimal pressure and lipid control recommended.  Follow-up echo 12 months.  6. HTN: Blood pressure is well controlled today.  Continue current medication regimen including Maxide and Lopressor as above.  Not on ACE inhibitor secondary to possible angioedema.  7. History of hepatitis C: Status post treatment with Harvoni and subsequent normalization of liver function testing.  Disposition: F/u with Dr.  Fletcher Anon or an APP in 3 months.   Medication Adjustments/Labs and Tests Ordered: Current medicines are reviewed at length with the patient today.  Concerns regarding medicines are outlined above. Medication changes, Labs and Tests ordered today are summarized above and listed in the Patient Instructions accessible in Encounters.   Signed, Christell Faith, PA-C 02/25/2019 9:13 AM     [facility] [address] [place], [place] [address] [phone]

## 2019-02-22 ENCOUNTER — Ambulatory Visit (INDEPENDENT_AMBULATORY_CARE_PROVIDER_SITE_OTHER): Payer: PPO | Admitting: Internal Medicine

## 2019-02-22 ENCOUNTER — Encounter: Payer: Self-pay | Admitting: Internal Medicine

## 2019-02-22 DIAGNOSIS — K219 Gastro-esophageal reflux disease without esophagitis: Secondary | ICD-10-CM | POA: Diagnosis not present

## 2019-02-22 DIAGNOSIS — K7460 Unspecified cirrhosis of liver: Secondary | ICD-10-CM | POA: Diagnosis not present

## 2019-02-22 DIAGNOSIS — Z7189 Other specified counseling: Secondary | ICD-10-CM | POA: Diagnosis not present

## 2019-02-22 DIAGNOSIS — I10 Essential (primary) hypertension: Secondary | ICD-10-CM | POA: Diagnosis not present

## 2019-02-22 DIAGNOSIS — Z0000 Encounter for general adult medical examination without abnormal findings: Secondary | ICD-10-CM

## 2019-02-22 DIAGNOSIS — I480 Paroxysmal atrial fibrillation: Secondary | ICD-10-CM | POA: Diagnosis not present

## 2019-02-22 NOTE — Assessment & Plan Note (Signed)
I have personally reviewed the Medicare Annual Wellness questionnaire and have noted 1. The patient's medical and social history 2. Their use of alcohol, tobacco or illicit drugs 3. Their current medications and supplements 4. The patient's functional ability including ADL's, fall risks, home safety risks and hearing or visual             impairment. 5. Diet and physical activities 6. Evidence for depression or mood disorders  The patients weight, height, BMI and visual acuity have been recorded in the chart I have made referrals, counseling and provided education to the patient based review of the above and I have provided the pt with a written personalized care plan for preventive services.  I have provided you with a copy of your personalized plan for preventive services. Please take the time to review along with your updated medication list.  Stopped smoking!!! Discussed maintenance Consider shingrix at the pharmacy Flu vaccine soon Colon due 2022 Just had PSA--consider another in 2 years Discussed fitness

## 2019-02-22 NOTE — Assessment & Plan Note (Signed)
Feels he is still in regular rhythm Has cardiology follow up this week

## 2019-02-22 NOTE — Assessment & Plan Note (Signed)
BP Readings from Last 3 Encounters:  02/22/19 (!) 146/92  02/07/19 111/81  12/31/18 130/84   Up at home but usually okay Will be rechecked at cardiologist in 2 days Recent labs fine

## 2019-02-22 NOTE — Assessment & Plan Note (Signed)
Follow in hep C clinic Due for surveillance ultrasound

## 2019-02-22 NOTE — Progress Notes (Signed)
Subjective:    Patient ID: Craig Calhoun, male    DOB: 09-05-1950, 68 y.o.   MRN: [id number]  HPI Virtual visit for Medicare wellness and follow up of chronic health conditions Identification done Reviewed billing and he gave consent He is at home and I am in my office  No hospitalizations this year. Did have cardioversion for atrial fib Reviewed advanced directives Quit smoking 7 months ago. 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 Vision is okay Hearing aides----they help Other doctors---Arida/Gollan--cardiology, Armed forces technical officer, Engineer, water, Dr Phillip Heal -- derm, Dr Olivia Mackie No falls No depression or anhedonia Independent with instrumental ADLs No sig memory problems  He did test positive for COVID--but no symptoms Repeat testing was negative  Did have the cardioversion and converted Going back to cardiologist this week No palpitations ---"seems to be a little different" No chest pain or SOB No dizziness or syncope No edema  Does see liver specialist every 6 months Was due for ultrasound of liver--awaiting negative test (will reschedule) No apparent symptoms  Uses omeprazole prn now Rare now No heartburn or dysphagia  Current Outpatient Medications on File Prior to Visit  Medication Sig Dispense Refill  . albuterol (VENTOLIN HFA) 108 (90 Base) MCG/ACT inhaler Inhale 2 puffs into the lungs every 6 (six) hours as needed for wheezing or shortness of breath. 1 Inhaler 0  . apixaban (ELIQUIS) 5 MG TABS tablet Take 1 tablet (5 mg total) by mouth 2 (two) times daily. 60 tablet 11  . diphenhydramine-acetaminophen (TYLENOL PM) 25-500 MG TABS tablet Take 1 tablet by mouth at bedtime as needed (sleep).    . metoprolol tartrate (LOPRESSOR) 25 MG tablet Take 1 tablet (25 mg total) by mouth 2 (two) times daily. 180 tablet 3  . omeprazole (PRILOSEC) 20 MG capsule TAKE  ONE (1) CAPSULE EACH DAY (Patient taking differently: Take 20 mg by mouth daily as needed (acid reflux/indigestion). ) 30 capsule 11  . triamterene-hydrochlorothiazide (MAXZIDE-25) 37.5-25 MG tablet TAKE ONE (1) TABLET EACH DAY (Patient taking differently: Take 1 tablet by mouth daily. ) 90 tablet 1   No current facility-administered medications on file prior to visit.     Allergies  Allergen Reactions  . Lisinopril     Uvula swelling---?angioedema    Past Medical History:  Diagnosis Date  . GERD (gastroesophageal reflux disease)   . Hepatitis C    Rx interferon/ribaviron 2004-'05 (recurred after Rx), treated and cleared with Harvoni 2016  . Hypertension     Past Surgical History:  Procedure Laterality Date  . APPENDECTOMY    . CARDIOVERSION N/A 02/07/2019   Procedure: CARDIOVERSION;  Surgeon: Wellington Hampshire, MD;  Location: [facility] ORS;  Service: Cardiovascular;  Laterality: N/A;  . COLONOSCOPY  09/2010   1 polyp-tubular adenoma  . COLONOSCOPY N/A 05/02/2016   Procedure: COLONOSCOPY;  Surgeon: Robert Bellow, MD;  Location: [facility] ENDOSCOPY;  Service: Endoscopy;  Laterality: N/A;  . LIVER BIOPSY    . LUMBAR LAMINECTOMY    . MENISCUS REPAIR Left 4/14    Family History  Problem Relation Age of Onset  . Diabetes Mother   . Heart disease Father   . Breast cancer Sister   . Colon cancer Maternal Aunt   . Hypertension Neg Hx   . Cancer Neg Hx        colon or prostate cancer    Social History   Socioeconomic History  .  Marital status: Married    Spouse name: Not on file  . Number of children: 3  . Years of education: Not on file  . Highest education level: Not on file  Occupational History  . Occupation: Futures trader, has farm    Comment: part time now  Social Needs  . Financial resource strain: Not on file  . Food insecurity    Worry: Not on file    Inability: Not on file  . Transportation needs    Medical: Not on file    Non-medical: Not on file   Tobacco Use  . Smoking status: Former Smoker    Packs/day: 0.50    Types: Cigarettes    Quit date: 07/16/2015    Years since quitting: 3.6  . Smokeless tobacco: Never Used  Substance and Sexual Activity  . Alcohol use: Yes    Alcohol/week: 1.0 standard drinks    Types: 1 Cans of beer per week  . Drug use: No  . Sexual activity: Not on file  Lifestyle  . Physical activity    Days per week: Not on file    Minutes per session: Not on file  . Stress: Not on file  Relationships  . Social Herbalist on phone: Not on file    Gets together: Not on file    Attends religious service: Not on file    Active member of club or organization: Not on file    Attends meetings of clubs or organizations: Not on file    Relationship status: Not on file  . Intimate partner violence    Fear of current or ex partner: Not on file    Emotionally abused: Not on file    Physically abused: Not on file    Forced sexual activity: Not on file  Other Topics Concern  . Not on file  Social History Narrative   No living will   No formal health care POA--would want wife, then daughter   Would accept resuscitation attempts   Not sure about tube feeds   Review of Systems Appetite is fine Weight is stable Sleeps okay---tosses and turns some. Better since cardioversion Wears seat belt Teeth okay--overdue for dentist due to [place] move fine. No blood Voids well. Stream seems fine, empties okay. Nocturia x 1-2 No suspicious skin lesions No sig joint pain. Will get intermittent low back pain---usually just goes away without Rx    Objective:   Physical Exam  Constitutional: He is oriented to person, place, and time. He appears well-developed. No distress.  Respiratory: Effort normal. No respiratory distress.  Neurological: He is alert and oriented to person, place, and time.  President--- "Daisy Floro, Obama, Clinton--Bush" [phone] [id number] Recall 3/3  Psychiatric: He has  a normal mood and affect. His behavior is normal.           Assessment & Plan:

## 2019-02-22 NOTE — Assessment & Plan Note (Signed)
Improved Only uses med occasionally

## 2019-02-22 NOTE — Assessment & Plan Note (Signed)
See social history 

## 2019-02-22 NOTE — Progress Notes (Signed)
Hearing Screening   125Hz  250Hz  500Hz  1000Hz  2000Hz  3000Hz  4000Hz  6000Hz  8000Hz   Right ear:           Left ear:           Comments: Has hearing aids.  Vision Screening Comments: Not checked in the last 12 months

## 2019-02-25 ENCOUNTER — Ambulatory Visit (INDEPENDENT_AMBULATORY_CARE_PROVIDER_SITE_OTHER): Payer: PPO | Admitting: Physician Assistant

## 2019-02-25 ENCOUNTER — Other Ambulatory Visit: Payer: Self-pay

## 2019-02-25 ENCOUNTER — Encounter: Payer: Self-pay | Admitting: Physician Assistant

## 2019-02-25 VITALS — BP 128/60 | HR 56 | Ht 74.0 in | Wt 194.0 lb

## 2019-02-25 DIAGNOSIS — I519 Heart disease, unspecified: Secondary | ICD-10-CM

## 2019-02-25 DIAGNOSIS — U071 COVID-19: Secondary | ICD-10-CM | POA: Diagnosis not present

## 2019-02-25 DIAGNOSIS — I4819 Other persistent atrial fibrillation: Secondary | ICD-10-CM

## 2019-02-25 DIAGNOSIS — J988 Other specified respiratory disorders: Secondary | ICD-10-CM | POA: Diagnosis not present

## 2019-02-25 DIAGNOSIS — I10 Essential (primary) hypertension: Secondary | ICD-10-CM

## 2019-02-25 DIAGNOSIS — I340 Nonrheumatic mitral (valve) insufficiency: Secondary | ICD-10-CM

## 2019-02-25 DIAGNOSIS — I77810 Thoracic aortic ectasia: Secondary | ICD-10-CM

## 2019-02-25 DIAGNOSIS — R001 Bradycardia, unspecified: Secondary | ICD-10-CM | POA: Diagnosis not present

## 2019-02-25 MED ORDER — METOPROLOL TARTRATE 25 MG PO TABS: 12.5000 mg | ORAL_TABLET | Freq: Two times a day (BID) | ORAL | 2 refills | Status: DC

## 2019-02-25 NOTE — Patient Instructions (Addendum)
Medication Instructions:  Your physician has recommended you make the following change in your medication:  1- DECREASE Metoprolol (lopressor) to 12.5 mg (0.5 tablet) by mouth two times a day.  If you need a refill on your cardiac medications before your next appointment, please call your pharmacy.   Lab work: - None ordered.  If you have labs (blood work) drawn today and your tests are completely normal, you will receive your results only by: Marland Kitchen MyChart Message (if you have MyChart) OR . A paper copy in the mail If you have any lab test that is abnormal or we need to change your treatment, we will call you to review the results.  Testing/Procedures: Your physician has requested that you have an Limited echocardiogram end of September. Echocardiography is a painless test that uses sound waves to create images of your heart. It provides your doctor with information about the size and shape of your heart and how well your heart's chambers and valves are working. This procedure takes approximately one hour. There are no restrictions for this procedure. You may get an IV, if needed, to receive an ultrasound enhancing agent through to better visualize your heart.    Follow-Up: At [facility], you and your health needs are our priority.  As part of our continuing mission to provide you with exceptional heart care, we have created designated Provider Care Teams.  These Care Teams include your primary Cardiologist (physician) and Advanced Practice Providers (APPs -  Physician Assistants and Nurse Practitioners) who all work together to provide you with the care you need, when you need it. You will need a follow up appointment in 3 months.   You may see Kathlyn Sacramento, MD or one of the following Advanced Practice Providers on your designated Care Team:   Murray Hodgkins, NP Christell Faith, PA-C . Marrianne Mood, PA-C

## 2019-03-08 ENCOUNTER — Ambulatory Visit
Admission: RE | Admit: 2019-03-08 | Discharge: 2019-03-08 | Disposition: A | Discharge status: Home / Self Care | Payer: PPO | Source: Ambulatory Visit | Attending: Nurse Practitioner | Admitting: Nurse Practitioner

## 2019-03-08 DIAGNOSIS — K7469 Other cirrhosis of liver: Secondary | ICD-10-CM

## 2019-03-08 DIAGNOSIS — K766 Portal hypertension: Secondary | ICD-10-CM

## 2019-03-08 DIAGNOSIS — K7460 Unspecified cirrhosis of liver: Secondary | ICD-10-CM | POA: Diagnosis not present

## 2019-03-14 ENCOUNTER — Other Ambulatory Visit: Payer: Self-pay | Admitting: Internal Medicine

## 2019-03-23 ENCOUNTER — Other Ambulatory Visit: Payer: Self-pay

## 2019-03-23 ENCOUNTER — Ambulatory Visit (INDEPENDENT_AMBULATORY_CARE_PROVIDER_SITE_OTHER): Payer: PPO

## 2019-03-23 DIAGNOSIS — I4819 Other persistent atrial fibrillation: Secondary | ICD-10-CM | POA: Diagnosis not present

## 2019-03-23 DIAGNOSIS — I519 Heart disease, unspecified: Secondary | ICD-10-CM

## 2019-03-25 ENCOUNTER — Telehealth: Payer: Self-pay

## 2019-03-25 NOTE — Telephone Encounter (Signed)
The patient has been notified of the result and verbalized understanding.  All questions (if any) were answered.  Pt states the echo tech mentioned his heart was beating slow. I advised him to keep a log of his HR over the next week and call back with the results in case med changes need to be made. Pt agreeable.  Wilma Flavin, RN 03/25/2019 11:54 AM

## 2019-05-17 MED ORDER — APIXABAN 5 MG PO TABS: 5.0000 mg | ORAL_TABLET | Freq: Two times a day (BID) | ORAL | 2 refills | Status: DC

## 2019-06-02 ENCOUNTER — Encounter: Payer: Self-pay | Admitting: Cardiovascular Disease

## 2019-06-02 ENCOUNTER — Other Ambulatory Visit: Payer: Self-pay

## 2019-06-02 ENCOUNTER — Telehealth (INDEPENDENT_AMBULATORY_CARE_PROVIDER_SITE_OTHER): Payer: PPO | Admitting: Cardiovascular Disease

## 2019-06-02 VITALS — BP 146/93 | HR 58 | Ht 74.0 in | Wt 195.0 lb

## 2019-06-02 DIAGNOSIS — I4819 Other persistent atrial fibrillation: Secondary | ICD-10-CM | POA: Diagnosis not present

## 2019-06-02 DIAGNOSIS — I10 Essential (primary) hypertension: Secondary | ICD-10-CM

## 2019-06-02 NOTE — Progress Notes (Signed)
Virtual Visit via Video Note   This visit type was conducted due to national recommendations for restrictions regarding the COVID-19 Pandemic (e.g. social distancing) in an effort to limit this patient's exposure and mitigate transmission in our community.  Due to his co-morbid illnesses, this patient is at least at moderate risk for complications without adequate follow up.  This format is felt to be most appropriate for this patient at this time.  All issues noted in this document were discussed and addressed.  A limited physical exam was performed with this format.  Please refer to the patient's chart for his consent to telehealth for [facility].   Date:  06/02/2019   ID:  DOTSON GRASER, DOB 1950-10-31, MRN [id number]  Patient Location: Home Provider Location: Office  PCP:  Venia Carbon, MD  Cardiologist:  Kathlyn Sacramento, MD  Electrophysiologist:  None   Evaluation Performed:  Follow-Up Visit  Chief Complaint:    History of Present Illness:    Craig Calhoun is a 68 y.o. male was reached via phone for a follow-up visit regarding persistent atrial fibrillation.  This was supposed to be a video visit but the patient could not connect.  He has known history of hepatitis C which was treated, essential hypertension and tobacco use.  He quit smoking earlier this year but he continues to chew tobacco.  The patient underwent successful cardioversion in August and has been in sinus rhythm since then.  He was found to have incidental COVID-19 positive without symptoms before then. He is tolerating anticoagulation with no side effects. Echocardiogram before cardioversion showed an EF of 40 to 45%.  A repeat echo post cardioversion showed improvement in EF to 50 to 55%.  He is doing well with no chest pain, shortness of breath or palpitations.  No dizziness.  The patient does not have symptoms concerning for COVID-19 infection (fever, chills, cough, or new shortness of breath).     Past Medical History:  Diagnosis Date  . GERD (gastroesophageal reflux disease)   . Hepatitis C    Rx interferon/ribaviron 2004-'05 (recurred after Rx), treated and cleared with Harvoni 2016  . Hypertension    Past Surgical History:  Procedure Laterality Date  . APPENDECTOMY    . CARDIOVERSION N/A 02/07/2019   Procedure: CARDIOVERSION;  Surgeon: Wellington Hampshire, MD;  Location: [facility] ORS;  Service: Cardiovascular;  Laterality: N/A;  . COLONOSCOPY  09/2010   1 polyp-tubular adenoma  . COLONOSCOPY N/A 05/02/2016   Procedure: COLONOSCOPY;  Surgeon: Robert Bellow, MD;  Location: [facility] ENDOSCOPY;  Service: Endoscopy;  Laterality: N/A;  . LIVER BIOPSY    . LUMBAR LAMINECTOMY    . MENISCUS REPAIR Left 4/14     Current Meds  Medication Sig  . albuterol (VENTOLIN HFA) 108 (90 Base) MCG/ACT inhaler Inhale 2 puffs into the lungs every 6 (six) hours as needed for wheezing or shortness of breath.  Marland Kitchen apixaban (ELIQUIS) 5 MG TABS tablet Take 1 tablet (5 mg total) by mouth 2 (two) times daily.  . diphenhydramine-acetaminophen (TYLENOL PM) 25-500 MG TABS tablet Take 1 tablet by mouth at bedtime as needed (sleep).  . metoprolol tartrate (LOPRESSOR) 25 MG tablet Take 0.5 tablets (12.5 mg total) by mouth 2 (two) times daily.  Marland Kitchen omeprazole (PRILOSEC) 20 MG capsule TAKE ONE (1) CAPSULE EACH DAY (Patient taking differently: Take 20 mg by mouth daily as needed (acid reflux/indigestion). )  . triamterene-hydrochlorothiazide (MAXZIDE-25) 37.5-25 MG tablet TAKE ONE TABLET EVERY DAY  Allergies:   Lisinopril   Social History   Tobacco Use  . Smoking status: Former Smoker    Packs/day: 0.50    Types: Cigarettes    Quit date: 07/16/2015    Years since quitting: 3.8  . Smokeless tobacco: Never Used  Substance Use Topics  . Alcohol use: Yes    Alcohol/week: 1.0 standard drinks    Types: 1 Cans of beer per week  . Drug use: No     Family Hx: The patient's family history includes Breast cancer  in his sister; Colon cancer in his maternal aunt; Diabetes in his mother; Heart disease in his father. There is no history of Hypertension or Cancer.  ROS:   Please see the history of present illness.     All other systems reviewed and are negative.   Prior CV studies:   The following studies were reviewed today:  Reviewed most recent echocardiogram with him which was done in September.  Labs/Other Tests and Data Reviewed:    EKG:  No ECG reviewed.  Recent Labs: 12/17/2018: ALT 13 02/03/2019: BUN 25; Creatinine, Ser 1.22; Hemoglobin 15.8; Platelets 244; Potassium 3.7; Sodium 140   Recent Lipid Panel Lab Results  Component Value Date/Time   CHOL 182 12/17/2018 02:00 PM   TRIG 196.0 (H) 12/17/2018 02:00 PM   HDL 36.00 (L) 12/17/2018 02:00 PM   CHOLHDL 5 12/17/2018 02:00 PM   LDLCALC 106 (H) 12/17/2018 02:00 PM    Wt Readings from Last 3 Encounters:  06/02/19 195 lb (88.5 kg)  02/25/19 194 lb (88 kg)  02/22/19 197 lb (89.4 kg)     Objective:    Vital Signs:  Ht 6\' 2"  (1.88 m)   Wt 195 lb (88.5 kg)   BMI 25.04 kg/m    VITAL SIGNS:  reviewed  ASSESSMENT & PLAN:    1.  Persistent atrial fibrillation: Status post successful cardioversion in August.  Based on heart rate readings, he appears to be in sinus rhythm.  He is mildly bradycardic but asymptomatic.  Continue small dose metoprolol and long-term anticoagulation with Eliquis given chads vas score of 2.    2.  Mild cardiomyopathy: This was noted in the setting of atrial fibrillation but his EF improved to low normal after cardioversion.  3.  Essential hypertension: Blood pressure is elevated.  I will consider adding an ARB if blood pressure continues to be high or switching metoprolol to carvedilol.  4.  History of liver cirrhosis due to previous hepatitis C: The patient has been cured from hepatitis C and his cirrhosis seems to be very mild overall.  Recent LFTs were normal.  COVID-19 Education: The signs and  symptoms of COVID-19 were discussed with the patient and how to seek care for testing (follow up with PCP or arrange E-visit).  The importance of social distancing was discussed today.  Time:   Today, I have spent 6 minutes with the patient with telehealth technology discussing the above problems.     Medication Adjustments/Labs and Tests Ordered: Current medicines are reviewed at length with the patient today.  Concerns regarding medicines are outlined above.   Tests Ordered: No orders of the defined types were placed in this encounter.   Medication Changes: No orders of the defined types were placed in this encounter.   Follow Up:  In Person in 4 month(s)  Signed, Kathlyn Sacramento, MD  06/02/2019 8:14 AM    [place]

## 2019-06-02 NOTE — Patient Instructions (Signed)
Medication Instructions:  Continue same medications *If you need a refill on your cardiac medications before your next appointment, please call your pharmacy*  Lab Work: None If you have labs (blood work) drawn today and your tests are completely normal, you will receive your results only by: . MyChart Message (if you have MyChart) OR . A paper copy in the mail If you have any lab test that is abnormal or we need to change your treatment, we will call you to review the results.  Testing/Procedures: None  Follow-Up: At [facility], you and your health needs are our priority.  As part of our continuing mission to provide you with exceptional heart care, we have created designated Provider Care Teams.  These Care Teams include your primary Cardiologist (physician) and Advanced Practice Providers (APPs -  Physician Assistants and Nurse Practitioners) who all work together to provide you with the care you need, when you need it.  Your next appointment:   4 months  The format for your next appointment:   In Person  Provider:    You may see Muhammad Arida, MD or one of the following Advanced Practice Providers on your designated Care Team:    Christopher Berge, NP  Ryan Dunn, PA-C  Jacquelyn Visser, PA-C   

## 2019-07-14 DIAGNOSIS — L578 Other skin changes due to chronic exposure to nonionizing radiation: Secondary | ICD-10-CM | POA: Diagnosis not present

## 2019-07-14 DIAGNOSIS — Z872 Personal history of diseases of the skin and subcutaneous tissue: Secondary | ICD-10-CM | POA: Diagnosis not present

## 2019-07-14 DIAGNOSIS — L720 Epidermal cyst: Secondary | ICD-10-CM | POA: Diagnosis not present

## 2019-07-14 DIAGNOSIS — Z86018 Personal history of other benign neoplasm: Secondary | ICD-10-CM | POA: Diagnosis not present

## 2019-07-14 DIAGNOSIS — D2330 Other benign neoplasm of skin of unspecified part of face: Secondary | ICD-10-CM | POA: Diagnosis not present

## 2019-07-14 DIAGNOSIS — B078 Other viral warts: Secondary | ICD-10-CM | POA: Diagnosis not present

## 2019-07-14 DIAGNOSIS — L570 Actinic keratosis: Secondary | ICD-10-CM | POA: Diagnosis not present

## 2019-07-27 DIAGNOSIS — K7469 Other cirrhosis of liver: Secondary | ICD-10-CM | POA: Diagnosis not present

## 2019-08-02 ENCOUNTER — Other Ambulatory Visit: Payer: Self-pay | Admitting: Nurse Practitioner

## 2019-08-02 DIAGNOSIS — K7469 Other cirrhosis of liver: Secondary | ICD-10-CM

## 2019-08-04 ENCOUNTER — Ambulatory Visit
Admission: RE | Admit: 2019-08-04 | Discharge: 2019-08-04 | Disposition: A | Discharge status: Home / Self Care | Payer: PPO | Source: Ambulatory Visit | Attending: Nurse Practitioner | Admitting: Nurse Practitioner

## 2019-08-04 DIAGNOSIS — K7460 Unspecified cirrhosis of liver: Secondary | ICD-10-CM | POA: Diagnosis not present

## 2019-08-04 DIAGNOSIS — K7469 Other cirrhosis of liver: Secondary | ICD-10-CM | POA: Diagnosis not present

## 2019-08-15 ENCOUNTER — Ambulatory Visit: Payer: PPO | Attending: Internal Medicine

## 2019-08-15 DIAGNOSIS — Z23 Encounter for immunization: Secondary | ICD-10-CM | POA: Insufficient documentation

## 2019-08-15 NOTE — Progress Notes (Signed)
   Covid-19 Vaccination Clinic  Name:  [name]    MRN: [id number] DOB: [birth year]  08/15/2019  Mr. Craig Calhoun was observed post Covid-19 immunization for 15 minutes without incidence. He was provided with Vaccine Information Sheet and instruction to access the V-Safe system.   Mr. Craig Calhoun was instructed to call 911 with any severe reactions post vaccine: Marland Kitchen Difficulty breathing  . Swelling of your face and throat  . A fast heartbeat  . A bad rash all over your body  . Dizziness and weakness    Immunizations Administered    Name Date Dose VIS Date Route   Pfizer COVID-19 Vaccine 08/15/2019  9:32 AM 0.3 mL 06/03/2019 Intramuscular   Manufacturer: [place]   Lot: [id number]   [place]: [id number]

## 2019-09-06 ENCOUNTER — Ambulatory Visit: Payer: PPO | Attending: Internal Medicine

## 2019-09-06 DIAGNOSIS — Z23 Encounter for immunization: Secondary | ICD-10-CM

## 2019-09-06 NOTE — Progress Notes (Signed)
   Covid-19 Vaccination Clinic  Name:  [name]    MRN: [id number] DOB: [birth year]  09/06/2019  [name] was observed post Covid-19 immunization for 15 minutes without incident. He was provided with Vaccine Information Sheet and instruction to access the V-Safe system.   [name] was instructed to call 911 with any severe reactions post vaccine: Marland Kitchen Difficulty breathing  . Swelling of face and throat  . A fast heartbeat  . A bad rash all over body  . Dizziness and weakness   Immunizations Administered    Name Date Dose VIS Date Route   Pfizer COVID-19 Vaccine 09/06/2019  9:48 AM 0.3 mL 06/03/2019 Intramuscular   Manufacturer: [place]   Lot: [id number]   [place]: [id number]

## 2019-10-06 ENCOUNTER — Encounter: Payer: Self-pay | Admitting: Cardiovascular Disease

## 2019-10-06 ENCOUNTER — Ambulatory Visit (INDEPENDENT_AMBULATORY_CARE_PROVIDER_SITE_OTHER): Payer: PPO | Admitting: Cardiovascular Disease

## 2019-10-06 ENCOUNTER — Other Ambulatory Visit: Payer: Self-pay

## 2019-10-06 VITALS — BP 132/72 | HR 64 | Ht 74.0 in | Wt 193.8 lb

## 2019-10-06 DIAGNOSIS — I10 Essential (primary) hypertension: Secondary | ICD-10-CM | POA: Diagnosis not present

## 2019-10-06 DIAGNOSIS — I4819 Other persistent atrial fibrillation: Secondary | ICD-10-CM

## 2019-10-06 NOTE — Patient Instructions (Signed)

## 2019-10-06 NOTE — Progress Notes (Signed)
Cardiology Office Note   Date:  10/06/2019   ID:  Calhoun, Craig 05/23/1951, MRN [id number]  PCP:  Venia Carbon, MD  Cardiologist:   Kathlyn Sacramento, MD   Chief Complaint  Patient presents with  . other    4 mth f/u      History of Present Illness: Craig Calhoun is a 69 y.o. male who is here today regarding persistent atrial fibrillation.    He has known history of hepatitis C which was treated, essential hypertension and tobacco use.  He quit smoking in 2020 but he continues to chew tobacco.  The patient underwent successful cardioversion in August and has been in sinus rhythm since then.  He was found to have incidental COVID-19 positive without symptoms before then. He is tolerating anticoagulation with no side effects. Echocardiogram before cardioversion showed an EF of 40 to 45%.  A repeat echo post cardioversion showed improvement in EF to 50 to 55%.   He is doing well with no chest pain or palpitations.  He reports mild exertional dyspnea.  No side effects with anticoagulation.    Past Medical History:  Diagnosis Date  . GERD (gastroesophageal reflux disease)   . Hepatitis C    Rx interferon/ribaviron 2004-'05 (recurred after Rx), treated and cleared with Harvoni 2016  . Hypertension     Past Surgical History:  Procedure Laterality Date  . APPENDECTOMY    . CARDIOVERSION N/A 02/07/2019   Procedure: CARDIOVERSION;  Surgeon: Wellington Hampshire, MD;  Location: [facility] ORS;  Service: Cardiovascular;  Laterality: N/A;  . COLONOSCOPY  09/2010   1 polyp-tubular adenoma  . COLONOSCOPY N/A 05/02/2016   Procedure: COLONOSCOPY;  Surgeon: Robert Bellow, MD;  Location: [facility] ENDOSCOPY;  Service: Endoscopy;  Laterality: N/A;  . LIVER BIOPSY    . LUMBAR LAMINECTOMY    . MENISCUS REPAIR Left 4/14     Current Outpatient Medications  Medication Sig Dispense Refill  . albuterol (VENTOLIN HFA) 108 (90 Base) MCG/ACT inhaler Inhale 2 puffs into the lungs every 6  (six) hours as needed for wheezing or shortness of breath. 1 Inhaler 0  . apixaban (ELIQUIS) 5 MG TABS tablet Take 1 tablet (5 mg total) by mouth 2 (two) times daily. 180 tablet 2  . diphenhydramine-acetaminophen (TYLENOL PM) 25-500 MG TABS tablet Take 1 tablet by mouth at bedtime as needed (sleep).    . metoprolol tartrate (LOPRESSOR) 25 MG tablet Take 0.5 tablets (12.5 mg total) by mouth 2 (two) times daily. 90 tablet 2  . omeprazole (PRILOSEC) 20 MG capsule Take 20 mg by mouth as needed.    . triamterene-hydrochlorothiazide (MAXZIDE-25) 37.5-25 MG tablet TAKE ONE TABLET EVERY DAY 90 tablet 3   No current facility-administered medications for this visit.    Allergies:   Lisinopril    Social History:  The patient  reports that he quit smoking about 4 years ago. His smoking use included cigarettes. He smoked 0.50 packs per day. He has never used smokeless tobacco. He reports current alcohol use of about 1.0 standard drinks of alcohol per week. He reports that he does not use drugs.   Family History:  The patient's family history includes Breast cancer in his sister; Colon cancer in his maternal aunt; Diabetes in his mother; Heart disease in his father.    ROS:  Please see the history of present illness.   Otherwise, review of systems are positive for none.   All other systems are reviewed and negative.  PHYSICAL EXAM: VS:  BP 132/72   Pulse 64   Ht 6\' 2"  (1.88 m)   Wt 193 lb 12.8 oz (87.9 kg)   SpO2 98%   BMI 24.88 kg/m  , BMI Body mass index is 24.88 kg/m. GEN: Well nourished, well developed, in no acute distress  HEENT: normal  Neck: no JVD, carotid bruits, or masses Cardiac: Regular rate and rhythm; no murmurs, rubs, or gallops,no edema  Respiratory:  clear to auscultation bilaterally, normal work of breathing GI: soft, nontender, nondistended, + BS MS: no deformity or atrophy  Skin: warm and dry, no rash Neuro:  Strength and sensation are intact Psych: euthymic mood, full  affect   EKG:  EKG is ordered today. The ekg ordered today demonstrates normal sinus rhythm with sinus arrhythmia.  No significant ST or T wave changes.  Recent Labs: 12/17/2018: ALT 13 02/03/2019: BUN 25; Creatinine, Ser 1.22; Hemoglobin 15.8; Platelets 244; Potassium 3.7; Sodium 140    Lipid Panel    Component Value Date/Time   CHOL 182 12/17/2018 1400   TRIG 196.0 (H) 12/17/2018 1400   HDL 36.00 (L) 12/17/2018 1400   CHOLHDL 5 12/17/2018 1400   VLDL 39.2 12/17/2018 1400   LDLCALC 106 (H) 12/17/2018 1400      Wt Readings from Last 3 Encounters:  10/06/19 193 lb 12.8 oz (87.9 kg)  06/02/19 195 lb (88.5 kg)  02/25/19 194 lb (88 kg)      PAD Screen 12/31/2018  Previous PAD dx? No  Previous surgical procedure? No  Pain with walking? No  Feet/toe relief with dangling? No  Painful, non-healing ulcers? No  Extremities discolored? No      ASSESSMENT AND PLAN:  1.  Persistent atrial fibrillation: Status post successful cardioversion in August.  He is in sinus rhythm today.  Continue small dose metoprolol.  Continue anticoagulation with Eliquis.  Will obtain follow-up labs during next visit if not already done.  2.  Mild cardiomyopathy: This was noted in the setting of atrial fibrillation but his EF improved to low normal after cardioversion.  3.  Essential hypertension: Blood pressure is controlled.  4.  History of liver cirrhosis due to previous hepatitis C: The patient has been cured from hepatitis C and his cirrhosis seems to be very mild overall.  Recent LFTs were normal.    Disposition:   FU with me in 6 months  Signed,  Kathlyn Sacramento, MD  10/06/2019 8:33 AM    [name]

## 2019-12-02 ENCOUNTER — Other Ambulatory Visit: Payer: Self-pay | Admitting: Internal Medicine

## 2020-01-17 ENCOUNTER — Other Ambulatory Visit: Payer: Self-pay

## 2020-01-23 ENCOUNTER — Other Ambulatory Visit: Payer: Self-pay | Admitting: Nurse Practitioner

## 2020-01-23 DIAGNOSIS — K7469 Other cirrhosis of liver: Secondary | ICD-10-CM | POA: Diagnosis not present

## 2020-01-31 ENCOUNTER — Ambulatory Visit
Admission: RE | Admit: 2020-01-31 | Discharge: 2020-01-31 | Disposition: A | Discharge status: Home / Self Care | Payer: PPO | Source: Ambulatory Visit | Attending: Nurse Practitioner | Admitting: Nurse Practitioner

## 2020-01-31 DIAGNOSIS — K824 Cholesterolosis of gallbladder: Secondary | ICD-10-CM | POA: Diagnosis not present

## 2020-01-31 DIAGNOSIS — K7469 Other cirrhosis of liver: Secondary | ICD-10-CM

## 2020-01-31 DIAGNOSIS — K7460 Unspecified cirrhosis of liver: Secondary | ICD-10-CM | POA: Diagnosis not present

## 2020-02-24 ENCOUNTER — Encounter: Payer: PPO | Admitting: Internal Medicine

## 2020-02-29 DIAGNOSIS — H2513 Age-related nuclear cataract, bilateral: Secondary | ICD-10-CM | POA: Diagnosis not present

## 2020-03-01 ENCOUNTER — Ambulatory Visit (INDEPENDENT_AMBULATORY_CARE_PROVIDER_SITE_OTHER): Payer: PPO | Admitting: Internal Medicine

## 2020-03-01 ENCOUNTER — Encounter: Payer: Self-pay | Admitting: Internal Medicine

## 2020-03-01 ENCOUNTER — Other Ambulatory Visit: Payer: Self-pay

## 2020-03-01 VITALS — BP 130/84 | HR 80 | Temp 98.9°F | Ht 71.0 in | Wt 180.0 lb

## 2020-03-01 DIAGNOSIS — Z23 Encounter for immunization: Secondary | ICD-10-CM | POA: Diagnosis not present

## 2020-03-01 DIAGNOSIS — Z0000 Encounter for general adult medical examination without abnormal findings: Secondary | ICD-10-CM | POA: Diagnosis not present

## 2020-03-01 DIAGNOSIS — I10 Essential (primary) hypertension: Secondary | ICD-10-CM

## 2020-03-01 DIAGNOSIS — K7460 Unspecified cirrhosis of liver: Secondary | ICD-10-CM | POA: Diagnosis not present

## 2020-03-01 DIAGNOSIS — M545 Low back pain, unspecified: Secondary | ICD-10-CM | POA: Insufficient documentation

## 2020-03-01 DIAGNOSIS — I700 Atherosclerosis of aorta: Secondary | ICD-10-CM | POA: Diagnosis not present

## 2020-03-01 DIAGNOSIS — I480 Paroxysmal atrial fibrillation: Secondary | ICD-10-CM

## 2020-03-01 NOTE — Addendum Note (Signed)
Addended by: Pilar Grammes on: 03/01/2020 05:02 PM   Modules accepted: Orders

## 2020-03-01 NOTE — Assessment & Plan Note (Signed)
Compensated Continues follow up and routine ultrasounds for surveillance

## 2020-03-01 NOTE — Assessment & Plan Note (Signed)
Seems muscular He will try the chiropractor I expect it to be self limited

## 2020-03-01 NOTE — Assessment & Plan Note (Signed)
Still seems paroxysmal On metoprolol and eliquis

## 2020-03-01 NOTE — Assessment & Plan Note (Signed)
I have personally reviewed the Medicare Annual Wellness questionnaire and have noted 1. The patient's medical and social history 2. Their use of alcohol, tobacco or illicit drugs 3. Their current medications and supplements 4. The patient's functional ability including ADL's, fall risks, home safety risks and hearing or visual             impairment. 5. Diet and physical activities 6. Evidence for depression or mood disorders  The patients weight, height, BMI and visual acuity have been recorded in the chart I have made referrals, counseling and provided education to the patient based review of the above and I have provided the pt with a written personalized care plan for preventive services.  I have provided you with a copy of your personalized plan for preventive services. Please take the time to review along with your updated medication list.  COVID booster later in fall Flu vaccine today Last PSA next year Consider shingrix at pharmacy Discussed resistance training Colon due next year

## 2020-03-01 NOTE — Progress Notes (Signed)
Hearing Screening   125Hz  250Hz  500Hz  1000Hz  2000Hz  3000Hz  4000Hz  6000Hz  8000Hz   Right ear:           Left ear:           Comments: Has hearing aids. Not wearing them today.  Vision Screening Comments: September 2021

## 2020-03-01 NOTE — Assessment & Plan Note (Signed)
On imaging No statin due to cirrhosis 

## 2020-03-01 NOTE — Assessment & Plan Note (Signed)
BP Readings from Last 3 Encounters:  03/01/20 130/84  10/06/19 132/72  06/02/19 (!) 146/93   BP fine on maxzide and metoprolol

## 2020-03-01 NOTE — Progress Notes (Signed)
Subjective:    Patient ID: Craig Calhoun, male    DOB: May 29, 1951, 69 y.o.   MRN: [id number]  HPI Here for Medicare wellness visit and follow up of chronic health conditions This visit occurred during the SARS-CoV-2 public health emergency.  Safety protocols were in place, including screening questions prior to the visit, additional usage of staff PPE, and extensive cleaning of exam room while observing appropriate contact time as indicated for disinfecting solutions.   Reviewed form and advanced directives Reviewed other doctors Tries to walk. Still part time Architect as Librarian, academic. Discussed resistance training Did relapse with smoking some---now 1-2 cigarettes a day. Discussed and he plans to stop again Occasional beer--like if SIL visiting Vision okay Has hearing aides No falls No depression or anhedonia Independent with instrumental ADLs No sig memory issues  Has some bumps on his arm Scaly but not inflamed  Injured neck at beach--was pushing boat against tide Got bad enough he was seen there and got muscle relaxer (tizanidine)---done with these Did seem better--but then 2 days ago noticed something in lower back after getting out of chair Used cold then heat Still some pain--though better Sleeping in recliner since the neck thing No radiation into legs. No leg weakness Plans to see chiropractor tomorrow (name?)  Keeps up with liver specialist Gets ultrasounds every 6 months No symptoms with this  No palpitations No chest pain or SOB Exercise tolerance is stable No dizziness or syncope  Known aortic atherosclerosis on imaging  No heartburn lately No meds No dysphagia  Voids well Stream is fine Nocturia x 1  Current Outpatient Medications on File Prior to Visit  Medication Sig Dispense Refill  . albuterol (VENTOLIN HFA) 108 (90 Base) MCG/ACT inhaler Inhale 2 puffs into the lungs every 6 (six) hours as needed for wheezing or shortness of breath. 1  Inhaler 0  . apixaban (ELIQUIS) 5 MG TABS tablet Take 1 tablet (5 mg total) by mouth 2 (two) times daily. 180 tablet 2  . diphenhydramine-acetaminophen (TYLENOL PM) 25-500 MG TABS tablet Take 1 tablet by mouth at bedtime as needed (sleep).    . triamterene-hydrochlorothiazide (MAXZIDE-25) 37.5-25 MG tablet TAKE ONE TABLET EVERY DAY 90 tablet 0  . metoprolol tartrate (LOPRESSOR) 25 MG tablet Take 0.5 tablets (12.5 mg total) by mouth 2 (two) times daily. 90 tablet 2   No current facility-administered medications on file prior to visit.    Allergies  Allergen Reactions  . Lisinopril     Uvula swelling---?angioedema    Past Medical History:  Diagnosis Date  . GERD (gastroesophageal reflux disease)   . Hepatitis C    Rx interferon/ribaviron 2004-'05 (recurred after Rx), treated and cleared with Harvoni 2016  . Hypertension     Past Surgical History:  Procedure Laterality Date  . APPENDECTOMY    . CARDIOVERSION N/A 02/07/2019   Procedure: CARDIOVERSION;  Surgeon: Wellington Hampshire, MD;  Location: [facility] ORS;  Service: Cardiovascular;  Laterality: N/A;  . COLONOSCOPY  09/2010   1 polyp-tubular adenoma  . COLONOSCOPY N/A 05/02/2016   Procedure: COLONOSCOPY;  Surgeon: Robert Bellow, MD;  Location: [facility] ENDOSCOPY;  Service: Endoscopy;  Laterality: N/A;  . LIVER BIOPSY    . LUMBAR LAMINECTOMY    . MENISCUS REPAIR Left 4/14    Family History  Problem Relation Age of Onset  . Diabetes Mother   . Heart disease Father   . Breast cancer Sister   . Colon cancer Maternal Aunt   . Hypertension Neg  Hx   . Cancer Neg Hx        colon or prostate cancer    Social History   Socioeconomic History  . Marital status: Married    Spouse name: Not on file  . Number of children: 3  . Years of education: Not on file  . Highest education level: Not on file  Occupational History  . Occupation: Futures trader, has farm    Comment: part time now  Tobacco Use  . Smoking status:  Former Smoker    Packs/day: 0.50    Types: Cigarettes    Quit date: 07/16/2015    Years since quitting: 4.6  . Smokeless tobacco: Never Used  Substance and Sexual Activity  . Alcohol use: Yes    Alcohol/week: 1.0 standard drink    Types: 1 Cans of beer per week  . Drug use: No  . Sexual activity: Not on file  Other Topics Concern  . Not on file  Social History Narrative   No living will   No formal health care POA--would want wife, then daughter   Would accept resuscitation attempts   Not sure about tube feeds   Social Determinants of Health   Financial Resource Strain:   . Difficulty of Paying Living Expenses: Not on file  Food Insecurity:   . Worried About Charity fundraiser in the Last Year: Not on file  . Ran Out of Food in the Last Year: Not on file  Transportation Needs:   . Lack of Transportation (Medical): Not on file  . Lack of Transportation (Non-Medical): Not on file  Physical Activity:   . Days of Exercise per Week: Not on file  . Minutes of Exercise per Session: Not on file  Stress:   . Feeling of Stress : Not on file  Social Connections:   . Frequency of Communication with Friends and Family: Not on file  . Frequency of Social Gatherings with Friends and Family: Not on file  . Attends Religious Services: Not on file  . Active Member of Clubs or Organizations: Not on file  . Attends Archivist Meetings: Not on file  . Marital Status: Not on file  Intimate Partner Violence:   . Fear of Current or Ex-Partner: Not on file  . Emotionally Abused: Not on file  . Physically Abused: Not on file  . Sexually Abused: Not on file   Review of Systems Rash on low back ---seems better. Using roll on cool treatment--not sure if that caused the rash Appetite is good Has lost 10-15#---not as hungry in the summer Sleep is restless at times. Stays refreshed though. Melatonin some help (and rarely tylenol pm) Wears seat belt Teeth okay---keeps up with  dentist Bowels are normal--no blood No other significant joint pains    Objective:   Physical Exam Constitutional:      Appearance: Normal appearance.  HENT:     Mouth/Throat:     Comments: No oral lesions Eyes:     Conjunctiva/sclera: Conjunctivae normal.     Pupils: Pupils are equal, round, and reactive to light.  Cardiovascular:     Rate and Rhythm: Normal rate and regular rhythm.     Pulses: Normal pulses.     Heart sounds: No murmur heard.  No gallop.   Abdominal:     Palpations: Abdomen is soft.     Tenderness: There is no abdominal tenderness.  Musculoskeletal:     Right lower leg: No edema.  Left lower leg: No edema.     Comments: Mild tenderness across lumbar back--more in right paraspinals SLR negative ROM in hips okay  Skin:    Comments: Apparent benign scaly spots on left arm and 1 one right leg (okay to wait for routine derm check) Some pinpoint lesions across back--non specific  Neurological:     Mental Status: He is alert and oriented to person, place, and time.     Comments: President--- "Wendie Simmer---- ?" [id number] Recall 1/3  Psychiatric:        Mood and Affect: Mood normal.        Behavior: Behavior normal.            Assessment & Plan:

## 2020-03-02 LAB — HEPATIC FUNCTION PANEL
ALT: 11 U/L (ref 0–53)
AST: 20 U/L (ref 0–37)
Albumin: 4.5 g/dL (ref 3.5–5.2)
Alkaline Phosphatase: 66 U/L (ref 39–117)
Bilirubin, Direct: 0.2 mg/dL (ref 0.0–0.3)
Total Bilirubin: 0.5 mg/dL (ref 0.2–1.2)
Total Protein: 8.8 g/dL — ABNORMAL HIGH (ref 6.0–8.3)

## 2020-03-02 LAB — RENAL FUNCTION PANEL
Albumin: 4.5 g/dL (ref 3.5–5.2)
BUN: 20 mg/dL (ref 6–23)
CO2: 32 mEq/L (ref 19–32)
Calcium: 10 mg/dL (ref 8.4–10.5)
Chloride: 98 mEq/L (ref 96–112)
Creatinine, Ser: 0.97 mg/dL (ref 0.40–1.50)
GFR: 76.65 mL/min (ref >60.00)
Glucose, Bld: 90 mg/dL (ref 70–99)
Phosphorus: 3.4 mg/dL (ref 2.3–4.6)
Potassium: 3.8 mEq/L (ref 3.5–5.1)
Sodium: 138 mEq/L (ref 135–145)

## 2020-03-02 LAB — CBC
HCT: 41.9 % (ref 39.0–52.0)
Hemoglobin: 13.9 g/dL (ref 13.0–17.0)
MCHC: 33.2 g/dL (ref 30.0–36.0)
MCV: 95.2 fl (ref 78.0–100.0)
Platelets: 392 10*3/uL (ref 150.0–400.0)
RBC: 4.4 Mil/uL (ref 4.22–5.81)
RDW: 14.1 % (ref 11.5–15.5)
WBC: 15.3 10*3/uL — ABNORMAL HIGH (ref 4.0–10.5)

## 2020-03-07 DIAGNOSIS — M542 Cervicalgia: Secondary | ICD-10-CM | POA: Diagnosis not present

## 2020-03-07 DIAGNOSIS — M9902 Segmental and somatic dysfunction of thoracic region: Secondary | ICD-10-CM | POA: Diagnosis not present

## 2020-03-07 DIAGNOSIS — M9906 Segmental and somatic dysfunction of lower extremity: Secondary | ICD-10-CM | POA: Diagnosis not present

## 2020-03-07 DIAGNOSIS — M9903 Segmental and somatic dysfunction of lumbar region: Secondary | ICD-10-CM | POA: Diagnosis not present

## 2020-03-07 DIAGNOSIS — M9901 Segmental and somatic dysfunction of cervical region: Secondary | ICD-10-CM | POA: Diagnosis not present

## 2020-03-07 DIAGNOSIS — M545 Low back pain: Secondary | ICD-10-CM | POA: Diagnosis not present

## 2020-03-09 DIAGNOSIS — M542 Cervicalgia: Secondary | ICD-10-CM | POA: Diagnosis not present

## 2020-03-09 DIAGNOSIS — M545 Low back pain: Secondary | ICD-10-CM | POA: Diagnosis not present

## 2020-03-09 DIAGNOSIS — M9903 Segmental and somatic dysfunction of lumbar region: Secondary | ICD-10-CM | POA: Diagnosis not present

## 2020-03-09 DIAGNOSIS — M9902 Segmental and somatic dysfunction of thoracic region: Secondary | ICD-10-CM | POA: Diagnosis not present

## 2020-03-09 DIAGNOSIS — M9901 Segmental and somatic dysfunction of cervical region: Secondary | ICD-10-CM | POA: Diagnosis not present

## 2020-03-09 DIAGNOSIS — M9906 Segmental and somatic dysfunction of lower extremity: Secondary | ICD-10-CM | POA: Diagnosis not present

## 2020-03-12 DIAGNOSIS — M9901 Segmental and somatic dysfunction of cervical region: Secondary | ICD-10-CM | POA: Diagnosis not present

## 2020-03-12 DIAGNOSIS — M9903 Segmental and somatic dysfunction of lumbar region: Secondary | ICD-10-CM | POA: Diagnosis not present

## 2020-03-12 DIAGNOSIS — M542 Cervicalgia: Secondary | ICD-10-CM | POA: Diagnosis not present

## 2020-03-12 DIAGNOSIS — M9902 Segmental and somatic dysfunction of thoracic region: Secondary | ICD-10-CM | POA: Diagnosis not present

## 2020-03-12 DIAGNOSIS — M545 Low back pain: Secondary | ICD-10-CM | POA: Diagnosis not present

## 2020-03-12 DIAGNOSIS — M9906 Segmental and somatic dysfunction of lower extremity: Secondary | ICD-10-CM | POA: Diagnosis not present

## 2020-03-13 DIAGNOSIS — M9906 Segmental and somatic dysfunction of lower extremity: Secondary | ICD-10-CM | POA: Diagnosis not present

## 2020-03-13 DIAGNOSIS — M9903 Segmental and somatic dysfunction of lumbar region: Secondary | ICD-10-CM | POA: Diagnosis not present

## 2020-03-13 DIAGNOSIS — M542 Cervicalgia: Secondary | ICD-10-CM | POA: Diagnosis not present

## 2020-03-13 DIAGNOSIS — M9901 Segmental and somatic dysfunction of cervical region: Secondary | ICD-10-CM | POA: Diagnosis not present

## 2020-03-13 DIAGNOSIS — M9902 Segmental and somatic dysfunction of thoracic region: Secondary | ICD-10-CM | POA: Diagnosis not present

## 2020-03-13 DIAGNOSIS — M545 Low back pain: Secondary | ICD-10-CM | POA: Diagnosis not present

## 2020-03-15 DIAGNOSIS — M9902 Segmental and somatic dysfunction of thoracic region: Secondary | ICD-10-CM | POA: Diagnosis not present

## 2020-03-15 DIAGNOSIS — M545 Low back pain: Secondary | ICD-10-CM | POA: Diagnosis not present

## 2020-03-15 DIAGNOSIS — M9901 Segmental and somatic dysfunction of cervical region: Secondary | ICD-10-CM | POA: Diagnosis not present

## 2020-03-15 DIAGNOSIS — M542 Cervicalgia: Secondary | ICD-10-CM | POA: Diagnosis not present

## 2020-03-15 DIAGNOSIS — M9906 Segmental and somatic dysfunction of lower extremity: Secondary | ICD-10-CM | POA: Diagnosis not present

## 2020-03-15 DIAGNOSIS — M9903 Segmental and somatic dysfunction of lumbar region: Secondary | ICD-10-CM | POA: Diagnosis not present

## 2020-03-29 ENCOUNTER — Other Ambulatory Visit: Payer: Self-pay

## 2020-03-29 ENCOUNTER — Ambulatory Visit (INDEPENDENT_AMBULATORY_CARE_PROVIDER_SITE_OTHER): Payer: PPO | Admitting: Cardiovascular Disease

## 2020-03-29 ENCOUNTER — Encounter: Payer: Self-pay | Admitting: Cardiovascular Disease

## 2020-03-29 VITALS — BP 130/64 | HR 52 | Ht 74.0 in | Wt 181.5 lb

## 2020-03-29 DIAGNOSIS — I10 Essential (primary) hypertension: Secondary | ICD-10-CM | POA: Diagnosis not present

## 2020-03-29 DIAGNOSIS — I4819 Other persistent atrial fibrillation: Secondary | ICD-10-CM | POA: Diagnosis not present

## 2020-03-29 NOTE — Progress Notes (Signed)
Cardiology Office Note   Date:  03/29/2020   ID:  Craig Calhoun, DOB 1950/07/25, MRN [id number]  PCP:  Venia Carbon, MD  Cardiologist:   Kathlyn Sacramento, MD   Chief Complaint  Patient presents with  . office visit    6 month F/U; Meds verbally reviewed with patient.      History of Present Illness: Craig Calhoun is a 69 y.o. male who is here today regarding persistent atrial fibrillation.    He has known history of hepatitis C which was treated, essential hypertension and tobacco use.  He quit smoking in 2020 but he continues to chew tobacco.  The patient underwent successful cardioversion in August of 2020 and has been in sinus rhythm since then.  He was found to have incidental COVID-19 positive without symptoms before then. Echocardiogram before cardioversion showed an EF of 40 to 45%.  A repeat echo post cardioversion showed improvement in EF to 50 to 55%.   He is tolerating anticoagulation with no side effects.  He received COVID-19 vaccine.  He is doing well with no chest pain or palpitations.  He reports mild exertional dyspnea.  No side effects with anticoagulation.    Past Medical History:  Diagnosis Date  . GERD (gastroesophageal reflux disease)   . Hepatitis C    Rx interferon/ribaviron 2004-'05 (recurred after Rx), treated and cleared with Harvoni 2016  . Hypertension     Past Surgical History:  Procedure Laterality Date  . APPENDECTOMY    . CARDIOVERSION N/A 02/07/2019   Procedure: CARDIOVERSION;  Surgeon: Wellington Hampshire, MD;  Location: [facility] ORS;  Service: Cardiovascular;  Laterality: N/A;  . COLONOSCOPY  09/2010   1 polyp-tubular adenoma  . COLONOSCOPY N/A 05/02/2016   Procedure: COLONOSCOPY;  Surgeon: Robert Bellow, MD;  Location: [facility] ENDOSCOPY;  Service: Endoscopy;  Laterality: N/A;  . LIVER BIOPSY    . LUMBAR LAMINECTOMY    . MENISCUS REPAIR Left 4/14     Current Outpatient Medications  Medication Sig Dispense Refill  .  albuterol (VENTOLIN HFA) 108 (90 Base) MCG/ACT inhaler Inhale 2 puffs into the lungs every 6 (six) hours as needed for wheezing or shortness of breath. 1 Inhaler 0  . apixaban (ELIQUIS) 5 MG TABS tablet Take 1 tablet (5 mg total) by mouth 2 (two) times daily. 180 tablet 2  . diphenhydramine-acetaminophen (TYLENOL PM) 25-500 MG TABS tablet Take 1 tablet by mouth at bedtime as needed (sleep).    . metoprolol tartrate (LOPRESSOR) 25 MG tablet Take 0.5 tablets (12.5 mg total) by mouth 2 (two) times daily. 90 tablet 2  . tiZANidine (ZANAFLEX) 4 MG tablet Take 2 mg by mouth as needed for muscle spasms.    Marland Kitchen triamterene-hydrochlorothiazide (MAXZIDE-25) 37.5-25 MG tablet TAKE ONE TABLET EVERY DAY 90 tablet 0   No current facility-administered medications for this visit.    Allergies:   Lisinopril    Social History:  The patient  reports that he has been smoking cigarettes. He has been smoking about 0.50 packs per day. He has never used smokeless tobacco. He reports current alcohol use of about 1.0 standard drink of alcohol per week. He reports that he does not use drugs.   Family History:  The patient's family history includes Breast cancer in his sister; Colon cancer in his maternal aunt; Diabetes in his mother; Heart disease in his father.    ROS:  Please see the history of present illness.   Otherwise, review of systems  are positive for none.   All other systems are reviewed and negative.    PHYSICAL EXAM: VS:  BP 130/64 (BP Location: Left Arm, Patient Position: Sitting, Cuff Size: Normal)   Pulse (!) 52   Ht 6\' 2"  (1.88 m)   Wt 181 lb 8 oz (82.3 kg)   SpO2 96%   BMI 23.30 kg/m  , BMI Body mass index is 23.3 kg/m. GEN: Well nourished, well developed, in no acute distress  HEENT: normal  Neck: no JVD, carotid bruits, or masses Cardiac: Regular rate and rhythm; no murmurs, rubs, or gallops,no edema  Respiratory:  clear to auscultation bilaterally, normal work of breathing GI: soft,  nontender, nondistended, + BS MS: no deformity or atrophy  Skin: warm and dry, no rash Neuro:  Strength and sensation are intact Psych: euthymic mood, full affect   EKG:  EKG is ordered today. The ekg ordered today demonstrates sinus bradycardia with PACs.  Heart rate is 52 bpm.  No significant ST changes.   Recent Labs: 03/01/2020: ALT 11; BUN 20; Creatinine, Ser 0.97; Hemoglobin 13.9; Platelets 392.0; Potassium 3.8; Sodium 138    Lipid Panel    Component Value Date/Time   CHOL 182 12/17/2018 1400   TRIG 196.0 (H) 12/17/2018 1400   HDL 36.00 (L) 12/17/2018 1400   CHOLHDL 5 12/17/2018 1400   VLDL 39.2 12/17/2018 1400   LDLCALC 106 (H) 12/17/2018 1400      Wt Readings from Last 3 Encounters:  03/29/20 181 lb 8 oz (82.3 kg)  03/01/20 180 lb (81.6 kg)  10/06/19 193 lb 12.8 oz (87.9 kg)      PAD Screen 12/31/2018  Previous PAD dx? No  Previous surgical procedure? No  Pain with walking? No  Feet/toe relief with dangling? No  Painful, non-healing ulcers? No  Extremities discolored? No      ASSESSMENT AND PLAN:  1.  Persistent atrial fibrillation: Maintaining sinus rhythm status post cardioversion in 2020.  Continue small dose metoprolol and anticoagulation with Eliquis.  I reviewed his recent labs which showed no evidence of anemia and normal renal function.    2.  Mild cardiomyopathy: This was noted in the setting of atrial fibrillation but his EF improved to low normal after cardioversion.   3.  Essential hypertension: Blood pressure is controlled.  4.  History of liver cirrhosis due to previous hepatitis C: The patient has been cured from hepatitis C and his cirrhosis seems to be very mild overall.      Disposition:   FU with me in 6 months  Signed,  Kathlyn Sacramento, MD  03/29/2020 10:05 AM    [place]

## 2020-03-29 NOTE — Patient Instructions (Signed)

## 2020-05-07 ENCOUNTER — Other Ambulatory Visit: Payer: Self-pay | Admitting: Cardiovascular Disease

## 2020-05-08 ENCOUNTER — Other Ambulatory Visit: Payer: Self-pay | Admitting: Cardiovascular Disease

## 2020-05-16 ENCOUNTER — Other Ambulatory Visit: Payer: Self-pay | Admitting: Cardiovascular Disease

## 2020-05-16 NOTE — Telephone Encounter (Signed)
Requested Prescriptions   Signed Prescriptions Disp Refills   metoprolol tartrate (LOPRESSOR) 25 MG tablet 90 tablet 1    Sig: Take 0.5 tablets (12.5 mg total) by mouth 2 (two) times daily.    Authorizing Provider: Kathlyn Sacramento A    Ordering User: Othelia Pulling C   Pt taking 0.5 mg tablet bid of metoprolol tartrate. Verified with pt over the phone.

## 2020-05-16 NOTE — Telephone Encounter (Signed)
*  STAT* If patient is at the pharmacy, call can be transferred to refill team.   1. Which medications need to be refilled? (please list name of each medication and dose if known) Metoprolol  2. Which pharmacy/location (including street and city if local pharmacy) is medication to be sent to? [facility]  3. Do they need a 30 day or 90 day supply? [place]

## 2020-05-19 ENCOUNTER — Other Ambulatory Visit: Payer: Self-pay | Admitting: Internal Medicine

## 2020-05-28 ENCOUNTER — Other Ambulatory Visit: Payer: Self-pay | Admitting: Internal Medicine

## 2020-07-17 DIAGNOSIS — Z872 Personal history of diseases of the skin and subcutaneous tissue: Secondary | ICD-10-CM | POA: Diagnosis not present

## 2020-07-17 DIAGNOSIS — L578 Other skin changes due to chronic exposure to nonionizing radiation: Secondary | ICD-10-CM | POA: Diagnosis not present

## 2020-07-17 DIAGNOSIS — L570 Actinic keratosis: Secondary | ICD-10-CM | POA: Diagnosis not present

## 2020-07-17 DIAGNOSIS — Z86018 Personal history of other benign neoplasm: Secondary | ICD-10-CM | POA: Diagnosis not present

## 2020-07-25 DIAGNOSIS — K7469 Other cirrhosis of liver: Secondary | ICD-10-CM | POA: Diagnosis not present

## 2020-07-26 ENCOUNTER — Other Ambulatory Visit: Payer: Self-pay | Admitting: Nurse Practitioner

## 2020-07-26 DIAGNOSIS — K7469 Other cirrhosis of liver: Secondary | ICD-10-CM

## 2020-08-08 ENCOUNTER — Other Ambulatory Visit: Payer: PPO

## 2020-08-15 ENCOUNTER — Ambulatory Visit
Admission: RE | Admit: 2020-08-15 | Discharge: 2020-08-15 | Disposition: A | Discharge status: Home / Self Care | Payer: PPO | Source: Ambulatory Visit | Attending: Nurse Practitioner | Admitting: Nurse Practitioner

## 2020-08-15 ENCOUNTER — Other Ambulatory Visit: Payer: Self-pay

## 2020-08-15 ENCOUNTER — Other Ambulatory Visit: Payer: Self-pay | Admitting: Cardiovascular Disease

## 2020-08-15 DIAGNOSIS — K7469 Other cirrhosis of liver: Secondary | ICD-10-CM

## 2020-08-15 DIAGNOSIS — K824 Cholesterolosis of gallbladder: Secondary | ICD-10-CM | POA: Diagnosis not present

## 2020-08-15 DIAGNOSIS — K7460 Unspecified cirrhosis of liver: Secondary | ICD-10-CM | POA: Diagnosis not present

## 2020-08-20 ENCOUNTER — Other Ambulatory Visit: Payer: PPO

## 2020-09-30 NOTE — Progress Notes (Signed)
Office Visit    Patient Name: Craig Calhoun Date of Encounter: 10/01/2020  PCP:  Venia Carbon, MD   [place]  Cardiologist:  Kathlyn Sacramento, MD  Advanced Practice Provider:  No care team member to display Electrophysiologist:  None   Chief Complaint    Craig Calhoun is a 70 y.o. male with a hx of persistent atrial fibrillation on chronic anticoagulation,  tobacco use, hepatitis C s/p treatment presents today for follow up of atrial fibrillation.    Past Medical History    Past Medical History:  Diagnosis Date  . GERD (gastroesophageal reflux disease)   . Hepatitis C    Rx interferon/ribaviron 2004-'05 (recurred after Rx), treated and cleared with Harvoni 2016  . Hypertension    Past Surgical History:  Procedure Laterality Date  . APPENDECTOMY    . CARDIOVERSION N/A 02/07/2019   Procedure: CARDIOVERSION;  Surgeon: Wellington Hampshire, MD;  Location: [facility] ORS;  Service: Cardiovascular;  Laterality: N/A;  . COLONOSCOPY  09/2010   1 polyp-tubular adenoma  . COLONOSCOPY N/A 05/02/2016   Procedure: COLONOSCOPY;  Surgeon: Robert Bellow, MD;  Location: [facility] ENDOSCOPY;  Service: Endoscopy;  Laterality: N/A;  . LIVER BIOPSY    . LUMBAR LAMINECTOMY    . MENISCUS REPAIR Left 4/14    Allergies  Allergies  Allergen Reactions  . Lisinopril     Uvula swelling---?angioedema    History of Present Illness    Craig Calhoun is a 70 y.o. male with a hx of persistent atrial fibrillation on chronic anticoagulation, hepatitis C s/p treatment, tobacco use last seen 03/29/20 by Dr. Fletcher Anon.  He underwent successful cardioversion August 2020 and has been in sinus rhythm. He had echo with EF 40-45% while in atrial fib and repeat echo post cardioversion with EF 50-55%.   He presents today for follow up.  He reports feeling overall well.  Part-time as a Development worker, community.  Denies chest pain, pressure, tightness.  Ports no shortness  of breath at rest nor dyspnea on exertion.  Does not he stays active on the job site but has no formal exercise routine.  He endorses eating a low-salt, healthy diet.  Denies palpitations or symptoms suggestive of recurrent atrial fibrillation.  Reports no bleeding complications, melena, hematuria on Eliquis.  He does wish to be on fewer medications but we discussed the importance of metoprolol as well as Eliquis and he was agreeable to continue.  EKGs/Labs/Other Studies Reviewed:   The following studies were reviewed today:  Echo 01/2019  1. The left ventricle has mild-moderately reduced systolic function, with  an ejection fraction of 40-45%. The cavity size was normal. There is  mildly increased left ventricular wall thickness. Left ventricular  diastolic function could not be evaluated  secondary to atrial fibrillation. Left ventricular diffuse hypokinesis.   2. Left atrial size was mildly dilated.   3. Right atrial size was mildly dilated.   4. The mitral valve is degenerative. Mild thickening of the mitral valve  leaflet. Mitral valve regurgitation is mild to moderate by color flow  Doppler.   5. The aortic valve was not well visualized.   6. The aorta is abnormal in size and structure.   7. There is mild dilatation of the ascending aorta measuring 39 mm.   8. The interatrial septum was not well visualized.   Echo 02/2019  1. Left ventricular ejection fraction, by visual estimation, is 50 to  55%. The left  ventricle has normal function. There is no left ventricular  hypertrophy.   2. Global right ventricle has normal systolic function.The right  ventricular size is normal. Right vetricular wall thickness was not  assessed.   3. Left atrial size was normal.   4. Right atrial size was normal.   5. The mitral valve is degenerative. Trace mitral valve regurgitation.   6. The tricuspid valve is normal in structure. Tricuspid valve  regurgitation was not visualized by color flow  Doppler.   7. The aortic valve is tricuspid Aortic valve regurgitation was not  visualized by color flow Doppler.   8. The pulmonic valve was not well visualized. Pulmonic valve  regurgitation was not assessed by color flow Doppler.   9. Aortic dilatation noted.  10. There is mild dilatation of the aortic root measuring 39 mm.   EKG:  EKG is ordered today.  The ekg ordered today demonstrates SB 53 bpm with PACs and no acute ST/T wave changes.  Recent Labs: 03/01/2020: ALT 11; BUN 20; Creatinine, Ser 0.97; Hemoglobin 13.9; Platelets 392.0; Potassium 3.8; Sodium 138  Recent Lipid Panel    Component Value Date/Time   CHOL 182 12/17/2018 1400   TRIG 196.0 (H) 12/17/2018 1400   HDL 36.00 (L) 12/17/2018 1400   CHOLHDL 5 12/17/2018 1400   VLDL 39.2 12/17/2018 1400   LDLCALC 106 (H) 12/17/2018 1400    Risk Assessment/Calculations:   CHA2DS2-VASc Score = 3  This indicates a 3.2% annual risk of stroke. The patient's score is based upon: CHF History: Yes HTN History: Yes Diabetes History: No Stroke History: No Vascular Disease History: No Age Score: 1 Gender Score: 0   Home Medications   Current Meds  Medication Sig  . albuterol (VENTOLIN HFA) 108 (90 Base) MCG/ACT inhaler Inhale 2 puffs into the lungs every 6 (six) hours as needed for wheezing or shortness of breath.  . diphenhydramine-acetaminophen (TYLENOL PM) 25-500 MG TABS tablet Take 1 tablet by mouth at bedtime as needed (sleep).  Marland Kitchen ELIQUIS 5 MG TABS tablet TAKE ONE TABLET TWICE DAILY  . tiZANidine (ZANAFLEX) 4 MG tablet Take 2 mg by mouth as needed for muscle spasms.  Marland Kitchen triamterene-hydrochlorothiazide (MAXZIDE-25) 37.5-25 MG tablet TAKE ONE TABLET BY MOUTH EVERY DAY  . [DISCONTINUED] metoprolol tartrate (LOPRESSOR) 25 MG tablet TAKE ONE-HALF TABLET BY MOUTH TWICE DAILY     Review of Systems  All other systems reviewed and are otherwise negative except as noted above.  Physical Exam    VS:  BP 136/78 (BP Location: Left  Arm, Patient Position: Sitting, Cuff Size: Normal)   Pulse (!) 53   Ht 6\' 2"  (1.88 m)   Wt 191 lb (86.6 kg)   SpO2 98%   BMI 24.52 kg/m  , BMI Body mass index is 24.52 kg/m.  Wt Readings from Last 3 Encounters:  10/01/20 191 lb (86.6 kg)  03/29/20 181 lb 8 oz (82.3 kg)  03/01/20 180 lb (81.6 kg)    GEN: Well nourished, well developed, in no acute distress. HEENT: normal. Neck: Supple, no JVD, carotid bruits, or masses. Cardiac: RRR, bradycardic, no murmurs, rubs, or gallops. No clubbing, cyanosis, edema.  Radials/PT 2+ and equal bilaterally.  Respiratory:  Respirations regular and unlabored, clear to auscultation bilaterally. GI: Soft, nontender, nondistended. MS: No deformity or atrophy. Skin: Warm and dry, no rash. Neuro:  Strength and sensation are intact. Psych: Normal affect.  Assessment & Plan    1. Persistent atrial fibrillation - Maintaining sinus bradycardia by EKG  today.  As he is asymptomatic regarding his bradycardia with no lightheadedness, dizziness, fatigue we will continue metoprolol tartrate 12.5 mg twice daily.  Refill provided.  2. Chronic anticoagulation --Denies bleeding complications.  Continue Eliquis 5 mg twice daily.  Does not meet dose reduction criteria.CHA2DS2-VASc Score = 3 [CHF History: Yes, HTN History: Yes, Diabetes History: No, Stroke History: No, Vascular Disease History: No, Age Score: 1, Gender Score: 0].  Therefore, the patient's annual risk of stroke is 3.2 %.      3. Tobacco use - Smoking cessation encouraged. Recommend utilization of 1800QUITNOW.  4. HTN -BP mildly elevated in clinic today though she reports it is well controlled at home.  Encouraged to check a few times per week at home and to the line her back to his primary care provider in next follow-up.  He will contact us or his primary care provider if blood pressures consistently greater than 130/80.  Low-salt diet and regular cardiovascular exercise encouraged.   Disposition:  Follow up in 6 month(s) with Dr. Fletcher Anon or APP   Signed, Loel Dubonnet, NP 10/01/2020, 9:19 AM [place]

## 2020-10-01 ENCOUNTER — Other Ambulatory Visit: Payer: Self-pay

## 2020-10-01 ENCOUNTER — Encounter: Payer: Self-pay | Admitting: Family

## 2020-10-01 ENCOUNTER — Ambulatory Visit: Payer: PPO | Admitting: Family

## 2020-10-01 VITALS — BP 136/78 | HR 53 | Ht 74.0 in | Wt 191.0 lb

## 2020-10-01 DIAGNOSIS — I4819 Other persistent atrial fibrillation: Secondary | ICD-10-CM

## 2020-10-01 DIAGNOSIS — I10 Essential (primary) hypertension: Secondary | ICD-10-CM | POA: Diagnosis not present

## 2020-10-01 DIAGNOSIS — Z7901 Long term (current) use of anticoagulants: Secondary | ICD-10-CM | POA: Diagnosis not present

## 2020-10-01 DIAGNOSIS — Z720 Tobacco use: Secondary | ICD-10-CM | POA: Diagnosis not present

## 2020-10-01 MED ORDER — METOPROLOL TARTRATE 25 MG PO TABS: 12.5000 mg | ORAL_TABLET | Freq: Two times a day (BID) | ORAL | 2 refills | Status: DC

## 2020-10-01 NOTE — Patient Instructions (Signed)
Medication Instructions:  Continue your current medications.   *If you need a refill on your cardiac medications before your next appointment, please call your pharmacy*   Lab Work: None ordered today  Testing/Procedures: Your EKG today shows sinus bradycardia which is a stable finding.   Follow-Up: At [facility], you and your health needs are our priority.  As part of our continuing mission to provide you with exceptional heart care, we have created designated Provider Care Teams.  These Care Teams include your primary Cardiologist (physician) and Advanced Practice Providers (APPs -  Physician Assistants and Nurse Practitioners) who all work together to provide you with the care you need, when you need it.  We recommend signing up for the patient portal called "MyChart".  Sign up information is provided on this After Visit Summary.  MyChart is used to connect with patients for Virtual Visits (Telemedicine).  Patients are able to view lab/test results, encounter notes, upcoming appointments, etc.  Non-urgent messages can be sent to your provider as well.   To learn more about what you can do with MyChart, go to [email or web address].    Your next appointment:   6 month(s)  The format for your next appointment:   In Person  Provider:   You may see Kathlyn Sacramento, MD or one of the following Advanced Practice Providers on your designated Care Team:    Murray Hodgkins, NP  Christell Faith, PA-C  Marrianne Mood, PA-C  Cadence Kathlen Mody, [place]  Laurann [place], NP   Other Instructions  Heart Healthy Diet Recommendations: A low-salt diet is recommended. Meats should be grilled, baked, or boiled. Avoid fried foods. Focus on lean protein sources like fish or chicken with vegetables and fruits. The American Heart Association is a Microbiologist!  American Heart Association Diet and Lifeystyle Recommendations   Exercise recommendations: The American Heart Association recommends 150  minutes of moderate intensity exercise weekly. Try 30 minutes of moderate intensity exercise 4-5 times per week. This could include walking, jogging, or swimming.

## 2021-02-07 DIAGNOSIS — U071 COVID-19: Secondary | ICD-10-CM | POA: Insufficient documentation

## 2021-02-07 DIAGNOSIS — I10 Essential (primary) hypertension: Secondary | ICD-10-CM | POA: Insufficient documentation

## 2021-02-08 DIAGNOSIS — B182 Chronic viral hepatitis C: Secondary | ICD-10-CM | POA: Diagnosis not present

## 2021-02-08 DIAGNOSIS — K7469 Other cirrhosis of liver: Secondary | ICD-10-CM | POA: Diagnosis not present

## 2021-03-05 ENCOUNTER — Ambulatory Visit (INDEPENDENT_AMBULATORY_CARE_PROVIDER_SITE_OTHER): Payer: PPO | Admitting: Internal Medicine

## 2021-03-05 ENCOUNTER — Other Ambulatory Visit: Payer: Self-pay

## 2021-03-05 ENCOUNTER — Encounter: Payer: Self-pay | Admitting: Internal Medicine

## 2021-03-05 VITALS — BP 140/82 | HR 64 | Temp 98.2°F | Ht 72.0 in | Wt 186.0 lb

## 2021-03-05 DIAGNOSIS — I700 Atherosclerosis of aorta: Secondary | ICD-10-CM

## 2021-03-05 DIAGNOSIS — Z0000 Encounter for general adult medical examination without abnormal findings: Secondary | ICD-10-CM

## 2021-03-05 DIAGNOSIS — I10 Essential (primary) hypertension: Secondary | ICD-10-CM

## 2021-03-05 DIAGNOSIS — I480 Paroxysmal atrial fibrillation: Secondary | ICD-10-CM

## 2021-03-05 DIAGNOSIS — R200 Anesthesia of skin: Secondary | ICD-10-CM | POA: Diagnosis not present

## 2021-03-05 DIAGNOSIS — K7469 Other cirrhosis of liver: Secondary | ICD-10-CM

## 2021-03-05 DIAGNOSIS — Z23 Encounter for immunization: Secondary | ICD-10-CM | POA: Diagnosis not present

## 2021-03-05 DIAGNOSIS — N400 Enlarged prostate without lower urinary tract symptoms: Secondary | ICD-10-CM

## 2021-03-05 DIAGNOSIS — Z7189 Other specified counseling: Secondary | ICD-10-CM | POA: Diagnosis not present

## 2021-03-05 LAB — COMPREHENSIVE METABOLIC PANEL
ALT: 16 U/L (ref 0–53)
AST: 22 U/L (ref 0–37)
Albumin: 4.2 g/dL (ref 3.5–5.2)
Alkaline Phosphatase: 66 U/L (ref 39–117)
BUN: 17 mg/dL (ref 6–23)
CO2: 29 mEq/L (ref 19–32)
Calcium: 9.6 mg/dL (ref 8.4–10.5)
Chloride: 100 mEq/L (ref 96–112)
Creatinine, Ser: 0.91 mg/dL (ref 0.40–1.50)
GFR: 85.46 mL/min (ref >60.00)
Glucose, Bld: 92 mg/dL (ref 70–99)
Potassium: 4.1 mEq/L (ref 3.5–5.1)
Sodium: 137 mEq/L (ref 135–145)
Total Bilirubin: 0.8 mg/dL (ref 0.2–1.2)
Total Protein: 7.8 g/dL (ref 6.0–8.3)

## 2021-03-05 LAB — CBC
HCT: 44.6 % (ref 39.0–52.0)
Hemoglobin: 14.4 g/dL (ref 13.0–17.0)
MCHC: 32.4 g/dL (ref 30.0–36.0)
MCV: 93.3 fl (ref 78.0–100.0)
Platelets: 281 10*3/uL (ref 150.0–400.0)
RBC: 4.78 Mil/uL (ref 4.22–5.81)
RDW: 14.9 % (ref 11.5–15.5)
WBC: 7.1 10*3/uL (ref 4.0–10.5)

## 2021-03-05 LAB — PSA: PSA: 0.38 ng/mL (ref 0.10–4.00)

## 2021-03-05 LAB — VITAMIN B12: Vitamin B-12: 280 pg/mL (ref 211–911)

## 2021-03-05 LAB — T4, FREE: Free T4: 0.73 ng/dL (ref 0.60–1.60)

## 2021-03-05 NOTE — Assessment & Plan Note (Signed)
Will check B12 and other labs

## 2021-03-05 NOTE — Assessment & Plan Note (Signed)
I have personally reviewed the Medicare Annual Wellness questionnaire and have noted 1. The patient's medical and social history 2. Their use of alcohol, tobacco or illicit drugs 3. Their current medications and supplements 4. The patient's functional ability including ADL's, fall risks, home safety risks and hearing or visual             impairment. 5. Diet and physical activities 6. Evidence for depression or mood disorders  The patients weight, height, BMI and visual acuity have been recorded in the chart I have made referrals, counseling and provided education to the patient based review of the above and I have provided the pt with a written personalized care plan for preventive services.  I have provided you with a copy of your personalized plan for preventive services. Please take the time to review along with your updated medication list.  Will give flu vaccine today Bivalent COVID booster Colonoscopy due later this year Will do last PSA Discussed exercise

## 2021-03-05 NOTE — Assessment & Plan Note (Signed)
On imaging Would not use statin given cirrhosis

## 2021-03-05 NOTE — Progress Notes (Signed)
Subjective:    Patient ID: Craig Calhoun, male    DOB: 03-May-1951, 70 y.o.   MRN: [id number]  HPI Here for Medicare wellness visit and follow up of chronic health conditions This visit occurred during the SARS-CoV-2 public health emergency.  Safety protocols were in place, including screening questions prior to the visit, additional usage of staff PPE, and extensive cleaning of exam room while observing appropriate contact time as indicated for disinfecting solutions.   Reviewed advanced directives Reviewed other doctors----Dawn Drazek--hepatology, Dr Arida--cardiology, Dr Ines Bloomer colonoscopy, dentist?, Dr Graham--dermatology Still smoking cigarettes---about 1 pack per week. Has had multiple attempts to stop Rare beer Vision is okay Has hearing aides--they help somewhat No falls No depression or anhedonia Still working part Primary school teacher. Does a lot of chores without set exercise Independent with instrumental ADLs Occasional short term memory issues---like leaving hose on  Rare feeling of heart palpitation ---not racing No chest pain or SOB No edema Slight dizziness at times---with activity. Resolves quickly. No syncope No headaches  Known aortic atherosclerosis on imaging  Has noticed some abnormal sensations in the balls of his feet  Current Outpatient Medications on File Prior to Visit  Medication Sig Dispense Refill   diphenhydramine-acetaminophen (TYLENOL PM) 25-500 MG TABS tablet Take 1 tablet by mouth at bedtime as needed (sleep).     ELIQUIS 5 MG TABS tablet TAKE ONE TABLET TWICE DAILY 180 tablet 3   metoprolol tartrate (LOPRESSOR) 25 MG tablet Take 0.5 tablets (12.5 mg total) by mouth 2 (two) times daily. 90 tablet 2   triamterene-hydrochlorothiazide (MAXZIDE-25) 37.5-25 MG tablet TAKE ONE TABLET BY MOUTH EVERY DAY 90 tablet 3   No current facility-administered medications on file prior to visit.    Allergies  Allergen Reactions    Lisinopril     Uvula swelling---?angioedema    Past Medical History:  Diagnosis Date   GERD (gastroesophageal reflux disease)    Hepatitis C    Rx interferon/ribaviron 2004-'05 (recurred after Rx), treated and cleared with Harvoni 2016   Hypertension     Past Surgical History:  Procedure Laterality Date   APPENDECTOMY     CARDIOVERSION N/A 02/07/2019   Procedure: CARDIOVERSION;  Surgeon: Wellington Hampshire, MD;  Location: [facility] ORS;  Service: Cardiovascular;  Laterality: N/A;   COLONOSCOPY  09/2010   1 polyp-tubular adenoma   COLONOSCOPY N/A 05/02/2016   Procedure: COLONOSCOPY;  Surgeon: Robert Bellow, MD;  Location: [facility] ENDOSCOPY;  Service: Endoscopy;  Laterality: N/A;   LIVER BIOPSY     LUMBAR LAMINECTOMY     MENISCUS REPAIR Left 4/14    Family History  Problem Relation Age of Onset   Diabetes Mother    Heart disease Father    Breast cancer Sister    Colon cancer Maternal Aunt    Hypertension Neg Hx    Cancer Neg Hx        colon or prostate cancer    Social History   Socioeconomic History   Marital status: Married    Spouse name: Not on file   Number of children: 3   Years of education: Not on file   Highest education level: Not on file  Occupational History   Occupation: Futures trader, has farm    Comment: part time now  Tobacco Use   Smoking status: Some Days    Packs/day: 0.50    Types: Cigarettes    Last attempt to quit: 07/16/2015    Years since quitting: 5.6   Smokeless tobacco:  Never   Tobacco comments:    3-4 cigarettes per day  Vaping Use   Vaping Use: Never used  Substance and Sexual Activity   Alcohol use: Yes    Alcohol/week: 1.0 standard drink    Types: 1 Cans of beer per week    Comment: occassionally   Drug use: No   Sexual activity: Not on file  Other Topics Concern   Not on file  Social History Narrative   No living will   No formal health care POA--would want wife, then daughter   Would accept resuscitation attempts    Not sure about tube feeds   Social Determinants of Health   Financial Resource Strain: Not on file  Food Insecurity: Not on file  Transportation Needs: Not on file  Physical Activity: Not on file  Stress: Not on file  Social Connections: Not on file  Intimate Partner Violence: Not on file   Review of Systems Appetite is good Weight is stable Doesn't sleep well--uses melatonin. Rarely uses tylenol PM Wears seat belt Teeth okay--due for dentist Will occasionally gets "aches and pains"---like left shoulder after fishing. Will rarely take ibuprofen '600mg'$  (once a month or less) No suspicious skin lesions today No heartburn or dysphagia Bowels are fine---no blood Voids okay--stream okay. Empties okay. Some frequency     Objective:   Physical Exam Constitutional:      Appearance: Normal appearance.  HENT:     Mouth/Throat:     Comments: No lesions Eyes:     Conjunctiva/sclera: Conjunctivae normal.     Pupils: Pupils are equal, round, and reactive to light.  Cardiovascular:     Rate and Rhythm: Normal rate and regular rhythm.     Pulses: Normal pulses.     Heart sounds: No murmur heard.   No gallop.  Pulmonary:     Effort: Pulmonary effort is normal.     Breath sounds: Normal breath sounds. No wheezing or rales.  Abdominal:     Palpations: Abdomen is soft.     Tenderness: There is no abdominal tenderness.  Musculoskeletal:     Cervical back: Neck supple.     Right lower leg: No edema.     Left lower leg: No edema.  Lymphadenopathy:     Cervical: No cervical adenopathy.  Skin:    General: Skin is warm.     Findings: No rash.  Neurological:     Mental Status: He is alert and oriented to person, place, and time.     Comments: Said month was August first--then corrected President---"Bden, Trump, Bush---Obama" [id number] Recall 3/3   Psychiatric:        Mood and Affect: Mood normal.        Behavior: Behavior normal.           Assessment  & Plan:

## 2021-03-05 NOTE — Assessment & Plan Note (Signed)
Mild symptoms No Rx for now 

## 2021-03-05 NOTE — Progress Notes (Signed)
Vision Screening   Right eye Left eye Both eyes  Without correction '20/25 20/20 20/20 '$  With correction     Hearing Screening - Comments:: Has hearing aids. Not wearing them today.

## 2021-03-05 NOTE — Assessment & Plan Note (Signed)
See social history 

## 2021-03-05 NOTE — Assessment & Plan Note (Signed)
No changes or symptoms Ultrasound surveillance--every 6 months

## 2021-03-05 NOTE — Addendum Note (Signed)
Addended by: Pilar Grammes on: 03/05/2021 10:34 AM   Modules accepted: Orders

## 2021-03-05 NOTE — Assessment & Plan Note (Signed)
BP Readings from Last 3 Encounters:  03/05/21 140/82  10/01/20 136/78  03/29/20 130/64   Good control on HCTZ, metoprolol

## 2021-03-05 NOTE — Assessment & Plan Note (Signed)
Sounds regular with skips today On eliquis and metoprolol

## 2021-03-06 ENCOUNTER — Other Ambulatory Visit: Payer: Self-pay | Admitting: Nurse Practitioner

## 2021-03-06 DIAGNOSIS — K7469 Other cirrhosis of liver: Secondary | ICD-10-CM

## 2021-03-13 ENCOUNTER — Ambulatory Visit
Admission: RE | Admit: 2021-03-13 | Discharge: 2021-03-13 | Disposition: A | Discharge status: Home / Self Care | Payer: PPO | Source: Ambulatory Visit | Attending: Nurse Practitioner | Admitting: Nurse Practitioner

## 2021-03-13 DIAGNOSIS — K824 Cholesterolosis of gallbladder: Secondary | ICD-10-CM | POA: Diagnosis not present

## 2021-03-13 DIAGNOSIS — K7469 Other cirrhosis of liver: Secondary | ICD-10-CM

## 2021-03-15 ENCOUNTER — Telehealth: Payer: Self-pay

## 2021-03-15 NOTE — Telephone Encounter (Signed)
Spoke with patient's wife-he wishes to remain with Dr.Byrnette for Colonoscopy.contact number provided.

## 2021-04-16 ENCOUNTER — Other Ambulatory Visit: Payer: Self-pay | Admitting: Internal Medicine

## 2021-04-16 DIAGNOSIS — M7541 Impingement syndrome of right shoulder: Secondary | ICD-10-CM | POA: Diagnosis not present

## 2021-04-16 DIAGNOSIS — M7542 Impingement syndrome of left shoulder: Secondary | ICD-10-CM | POA: Diagnosis not present

## 2021-04-23 ENCOUNTER — Ambulatory Visit: Payer: PPO | Admitting: Family Medicine

## 2021-04-30 DIAGNOSIS — M25551 Pain in right hip: Secondary | ICD-10-CM | POA: Diagnosis not present

## 2021-04-30 DIAGNOSIS — M25511 Pain in right shoulder: Secondary | ICD-10-CM | POA: Diagnosis not present

## 2021-05-07 DIAGNOSIS — M25512 Pain in left shoulder: Secondary | ICD-10-CM | POA: Diagnosis not present

## 2021-05-07 DIAGNOSIS — M25511 Pain in right shoulder: Secondary | ICD-10-CM | POA: Diagnosis not present

## 2021-05-14 DIAGNOSIS — M25511 Pain in right shoulder: Secondary | ICD-10-CM | POA: Diagnosis not present

## 2021-05-22 DIAGNOSIS — M25511 Pain in right shoulder: Secondary | ICD-10-CM | POA: Diagnosis not present

## 2021-05-22 DIAGNOSIS — M25512 Pain in left shoulder: Secondary | ICD-10-CM | POA: Diagnosis not present

## 2021-06-05 DIAGNOSIS — M25611 Stiffness of right shoulder, not elsewhere classified: Secondary | ICD-10-CM | POA: Diagnosis not present

## 2021-06-05 DIAGNOSIS — M25511 Pain in right shoulder: Secondary | ICD-10-CM | POA: Diagnosis not present

## 2021-06-05 DIAGNOSIS — M25612 Stiffness of left shoulder, not elsewhere classified: Secondary | ICD-10-CM | POA: Diagnosis not present

## 2021-06-05 DIAGNOSIS — M25512 Pain in left shoulder: Secondary | ICD-10-CM | POA: Diagnosis not present

## 2021-06-19 DIAGNOSIS — M25512 Pain in left shoulder: Secondary | ICD-10-CM | POA: Diagnosis not present

## 2021-06-19 DIAGNOSIS — M25612 Stiffness of left shoulder, not elsewhere classified: Secondary | ICD-10-CM | POA: Diagnosis not present

## 2021-06-19 DIAGNOSIS — M25511 Pain in right shoulder: Secondary | ICD-10-CM | POA: Diagnosis not present

## 2021-06-19 DIAGNOSIS — M25611 Stiffness of right shoulder, not elsewhere classified: Secondary | ICD-10-CM | POA: Diagnosis not present

## 2021-06-26 ENCOUNTER — Other Ambulatory Visit: Payer: Self-pay | Admitting: Internal Medicine

## 2021-06-26 DIAGNOSIS — M25512 Pain in left shoulder: Secondary | ICD-10-CM | POA: Diagnosis not present

## 2021-06-26 DIAGNOSIS — M25511 Pain in right shoulder: Secondary | ICD-10-CM | POA: Diagnosis not present

## 2021-06-26 DIAGNOSIS — M25611 Stiffness of right shoulder, not elsewhere classified: Secondary | ICD-10-CM | POA: Diagnosis not present

## 2021-06-26 DIAGNOSIS — M25612 Stiffness of left shoulder, not elsewhere classified: Secondary | ICD-10-CM | POA: Diagnosis not present

## 2021-07-10 DIAGNOSIS — M25612 Stiffness of left shoulder, not elsewhere classified: Secondary | ICD-10-CM | POA: Diagnosis not present

## 2021-07-10 DIAGNOSIS — M25511 Pain in right shoulder: Secondary | ICD-10-CM | POA: Diagnosis not present

## 2021-07-10 DIAGNOSIS — M25512 Pain in left shoulder: Secondary | ICD-10-CM | POA: Diagnosis not present

## 2021-07-10 DIAGNOSIS — M25611 Stiffness of right shoulder, not elsewhere classified: Secondary | ICD-10-CM | POA: Diagnosis not present

## 2021-07-11 DIAGNOSIS — Z86010 Personal history of colonic polyps: Secondary | ICD-10-CM | POA: Diagnosis not present

## 2021-07-16 ENCOUNTER — Other Ambulatory Visit: Payer: Self-pay | Admitting: General Surgery

## 2021-07-16 NOTE — Progress Notes (Signed)
Subjective:     Patient ID: Craig Calhoun is a 71 y.o. male.   HPI   The following portions of the patient's history were reviewed and updated as appropriate.   This an established patient is here today for: office visit. Here to discuss having a colonoscopy, last in 2017. He denies any GI issues. Bowels move daily, no bleeding.   Review of Systems  Constitutional: Negative for chills and fever.  Respiratory: Negative for cough.          Chief Complaint  Patient presents with   Pre-op Exam      BP (!) 152/78    Pulse 85    Temp 37.1 C (98.8 F)    Ht 190.5 cm (6\' 3" )    Wt 87.1 kg (192 lb)    SpO2 97%    BMI 24.00 kg/m        Past Medical History:  Diagnosis Date   GERD (gastroesophageal reflux disease)     Hepatitis      C   History of atrial fibrillation 02/07/2019    Cardioversion Dr. Sophronia Simas.  Successful with 200 J.  Chronic Eliquis treatment.   Hypertension             Past Surgical History:  Procedure Laterality Date   COLONOSCOPY   2012   meniscus repair left Left 2014   COLONOSCOPY   05/02/2016    Dr Bary Castilla   CARDIOVERSION EXTERNAL   02/07/2019   APPENDECTOMY       BACK SURGERY       LAMINECTOMY LUMBAR SPINE       LIVER BIOPSY              Social History           Socioeconomic History   Marital status: Married  Tobacco Use   Smoking status: Some Days      Packs/day: 0.50      Types: Cigarettes   Smokeless tobacco: Never   Tobacco comments:      Last attempt to quit 07-16-2015  Substance and Sexual Activity   Alcohol use: No   Drug use: Never            Allergies  Allergen Reactions   Lisinopril Anaphylaxis      Current Medications        Current Outpatient Medications  Medication Sig Dispense Refill   apixaban (ELIQUIS) 5 mg tablet Take 1 tablet by mouth 2 (two) times daily       diphenhydrAMINE-acetaminophen (TYLENOL PM) 25-500 mg per tablet Take by mouth as needed       melatonin 1 mg Chew Take by mouth as needed        metoprolol tartrate (LOPRESSOR) 25 MG tablet Take 12.5 mg by mouth 2 (two) times daily       tiZANidine (ZANAFLEX) 4 MG tablet as needed       triamterene-hydrochlorothiazide (MAXZIDE-25) 37.5-25 mg tablet Take 1 tablet by mouth once daily        No current facility-administered medications for this visit.             Family History  Problem Relation Age of Onset   Diabetes type II Mother     Heart disease Father     Breast cancer Sister     Colon cancer Maternal Aunt     High blood pressure (Hypertension) Neg Hx          Labs and Radiology:    10/18/2010  descending colon polyp:   Diagnosis Surgical [P], descending colon TUBULAR ADENOMA (X1); NEGATIVE FOR HIGH GRADE DYSPLASIA OR MALIGNANCY.   May 02, 2016 colonoscopy:    Normal.  5-year follow-up recommended based on 2012 exam.   March 05, 2021 laboratory review:   WBC 4.0 - 10.5 K/uL 7.1   RBC 4.22 - 5.81 Mil/uL 4.78   Platelets 150.0 - 400.0 K/uL 281.0   Hemoglobin 13.0 - 17.0 g/dL 14.4   HCT 39.0 - 52.0 % 44.6   MCV 78.0 - 100.0 fl 93.3   MCHC 30.0 - 36.0 g/dL 32.4   RDW 11.5 - 15.5 % 14.9     Sodium 135 - 145 mEq/L 137   Potassium 3.5 - 5.1 mEq/L 4.1   Chloride 96 - 112 mEq/L 100   CO2 19 - 32 mEq/L 29   Glucose, Bld 70 - 99 mg/dL 92   BUN 6 - 23 mg/dL 17   Creatinine, Ser 0.40 - 1.50 mg/dL 0.91   Total Bilirubin 0.2 - 1.2 mg/dL 0.8   Alkaline Phosphatase 39 - 117 U/L 66   AST 0 - 37 U/L 22   ALT 0 - 53 U/L 16   Total Protein 6.0 - 8.3 g/dL 7.8   Albumin 3.5 - 5.2 g/dL 4.2   GFR >60.00 mL/min 85.46       October 01, 2020 ECG: Sinus bradycardia with PACs.   February 08, 2021 hep otology note:   Patient has a history of hepatitis C status post treatment with sustained virologic response and cure.   Prior to initiating therapy he had elastography estimating F3-F4 fibrosis, imaging also noted a nodular contour to the liver.   In 2016 ultrasound reported a 3.4 cm nodular area in the hepatic dome,  MRI with no lesion suspicious for HCC. Dome lesion is no longer reported on ultrasound and most recent US in February 2022 reports a nodular contour to the liver consistent with cirrhosis, no focal liver lesions. He does have a 3 mm gallbladder polyp that is stable.  He denies any melena, hematemesis, hematochezia, increased abdominal girth, or problems with memory/confusion.  He denies any hospital or emergency room visits.         Objective:   Physical Exam Constitutional:      Appearance: Normal appearance.  Cardiovascular:     Rate and Rhythm: Normal rate and regular rhythm.     Pulses: Normal pulses.     Heart sounds: Normal heart sounds.  Pulmonary:     Effort: Pulmonary effort is normal.     Breath sounds: Normal breath sounds.  Musculoskeletal:     Cervical back: Neck supple.  Skin:    General: Skin is warm and dry.  Neurological:     Mental Status: He is alert and oriented to person, place, and time.  Psychiatric:        Mood and Affect: Mood normal.        Behavior: Behavior normal.           Assessment:     Past history colonic polyp, candidate for repeat colonoscopy.   Treated hepatitis C.   Prior atrial fibrillation, status post cardioversion with maintenance of sinus rhythm.  Chronic Eliquis therapy.    Plan:     Indications repeat colonoscopy were reviewed.  With him in sinus rhythm, low risk for holding his Eliquis therapy for 2 days prior.  Standard MiraLAX prep.   Patient will be contacted next week by the staff to schedule.  This note is partially prepared by Karie Fetch, RN, acting as a scribe in the presence of Dr. Hervey Ard, MD.  The documentation recorded by the scribe accurately reflects the service I personally performed and the decisions made by me.    Robert Bellow, MD FACS

## 2021-07-17 DIAGNOSIS — Z86018 Personal history of other benign neoplasm: Secondary | ICD-10-CM | POA: Diagnosis not present

## 2021-07-17 DIAGNOSIS — D485 Neoplasm of uncertain behavior of skin: Secondary | ICD-10-CM | POA: Diagnosis not present

## 2021-07-17 DIAGNOSIS — Z872 Personal history of diseases of the skin and subcutaneous tissue: Secondary | ICD-10-CM | POA: Diagnosis not present

## 2021-07-17 DIAGNOSIS — L814 Other melanin hyperpigmentation: Secondary | ICD-10-CM | POA: Diagnosis not present

## 2021-07-17 DIAGNOSIS — L821 Other seborrheic keratosis: Secondary | ICD-10-CM | POA: Diagnosis not present

## 2021-07-17 DIAGNOSIS — L578 Other skin changes due to chronic exposure to nonionizing radiation: Secondary | ICD-10-CM | POA: Diagnosis not present

## 2021-07-18 DIAGNOSIS — M25512 Pain in left shoulder: Secondary | ICD-10-CM | POA: Diagnosis not present

## 2021-07-18 DIAGNOSIS — M25612 Stiffness of left shoulder, not elsewhere classified: Secondary | ICD-10-CM | POA: Diagnosis not present

## 2021-07-18 DIAGNOSIS — M25611 Stiffness of right shoulder, not elsewhere classified: Secondary | ICD-10-CM | POA: Diagnosis not present

## 2021-07-18 DIAGNOSIS — M25511 Pain in right shoulder: Secondary | ICD-10-CM | POA: Diagnosis not present

## 2021-07-30 ENCOUNTER — Encounter: Payer: Self-pay | Admitting: General Surgery

## 2021-07-30 DIAGNOSIS — M25512 Pain in left shoulder: Secondary | ICD-10-CM | POA: Diagnosis not present

## 2021-07-30 DIAGNOSIS — M25511 Pain in right shoulder: Secondary | ICD-10-CM | POA: Diagnosis not present

## 2021-07-30 DIAGNOSIS — R200 Anesthesia of skin: Secondary | ICD-10-CM | POA: Diagnosis not present

## 2021-07-31 ENCOUNTER — Encounter: Payer: Self-pay | Admitting: General Surgery

## 2021-07-31 ENCOUNTER — Ambulatory Visit: Payer: PPO | Admitting: Anesthesiology

## 2021-07-31 ENCOUNTER — Ambulatory Visit
Admission: RE | Admit: 2021-07-31 | Discharge: 2021-07-31 | Disposition: A | Discharge status: Home / Self Care | Payer: PPO | Attending: General Surgery | Admitting: General Surgery

## 2021-07-31 ENCOUNTER — Encounter
Admission: RE | Disposition: A | Discharge status: Home / Self Care | Payer: Self-pay | Source: Home / Self Care | Attending: General Surgery

## 2021-07-31 DIAGNOSIS — I4891 Unspecified atrial fibrillation: Secondary | ICD-10-CM | POA: Diagnosis not present

## 2021-07-31 DIAGNOSIS — Z7901 Long term (current) use of anticoagulants: Secondary | ICD-10-CM | POA: Diagnosis not present

## 2021-07-31 DIAGNOSIS — Z86010 Personal history of colonic polyps: Secondary | ICD-10-CM | POA: Diagnosis not present

## 2021-07-31 DIAGNOSIS — Z87442 Personal history of urinary calculi: Secondary | ICD-10-CM | POA: Insufficient documentation

## 2021-07-31 DIAGNOSIS — F17210 Nicotine dependence, cigarettes, uncomplicated: Secondary | ICD-10-CM | POA: Diagnosis not present

## 2021-07-31 DIAGNOSIS — I10 Essential (primary) hypertension: Secondary | ICD-10-CM | POA: Insufficient documentation

## 2021-07-31 DIAGNOSIS — K7460 Unspecified cirrhosis of liver: Secondary | ICD-10-CM | POA: Insufficient documentation

## 2021-07-31 DIAGNOSIS — Z1211 Encounter for screening for malignant neoplasm of colon: Secondary | ICD-10-CM | POA: Diagnosis not present

## 2021-07-31 DIAGNOSIS — K219 Gastro-esophageal reflux disease without esophagitis: Secondary | ICD-10-CM | POA: Insufficient documentation

## 2021-07-31 DIAGNOSIS — K5730 Diverticulosis of large intestine without perforation or abscess without bleeding: Secondary | ICD-10-CM | POA: Diagnosis not present

## 2021-07-31 HISTORY — PX: COLONOSCOPY WITH PROPOFOL: SHX5780

## 2021-07-31 HISTORY — DX: Cardiac arrhythmia, unspecified: I49.9

## 2021-07-31 SURGERY — COLONOSCOPY WITH PROPOFOL
Anesthesia: General

## 2021-07-31 MED ORDER — PROPOFOL 10 MG/ML IV BOLUS
INTRAVENOUS | Status: DC | PRN
  Administered 2021-07-31: 70 mg via INTRAVENOUS

## 2021-07-31 MED ORDER — LIDOCAINE HCL (CARDIAC) PF 100 MG/5ML IV SOSY
PREFILLED_SYRINGE | INTRAVENOUS | Status: DC | PRN
  Administered 2021-07-31: 60 mg via INTRAVENOUS

## 2021-07-31 MED ORDER — SODIUM CHLORIDE 0.9 % IV SOLN
INTRAVENOUS | Status: DC
  Administered 2021-07-31: 1000 mL via INTRAVENOUS

## 2021-07-31 MED ORDER — PROPOFOL 500 MG/50ML IV EMUL
INTRAVENOUS | Status: DC | PRN
  Administered 2021-07-31: 140 ug/kg/min via INTRAVENOUS

## 2021-07-31 NOTE — Transfer of Care (Signed)
Immediate Anesthesia Transfer of Care Note  Patient: Craig Calhoun  Procedure(s) Performed: COLONOSCOPY WITH PROPOFOL  Patient Location: PACU  Anesthesia Type:General  Level of Consciousness: awake, alert  and oriented  Airway & Oxygen Therapy: Patient Spontanous Breathing  Post-op Assessment: Report given to RN and Post -op Vital signs reviewed and stable  Post vital signs: Reviewed and stable  Last Vitals:  Vitals Value Taken Time  BP    Temp    Pulse    Resp    SpO2      Last Pain:  Vitals:   07/31/21 1005  TempSrc: Temporal  PainSc: 0-No pain         Complications: No notable events documented.

## 2021-07-31 NOTE — H&P (Signed)
Craig Calhoun [id number] Sep 13, 1950     HPI: Healthy 71 y/o male with past history of colon polyps.  Chronic Eliquis for past history of atrial fibrillation (resolved post cardioversion.). Last Eliquis PM February 5.  Tolerated prep well.   Medications Prior to Admission  Medication Sig Dispense Refill Last Dose   diphenhydramine-acetaminophen (TYLENOL PM) 25-500 MG TABS tablet Take 1 tablet by mouth at bedtime as needed (sleep).   Past Week   ELIQUIS 5 MG TABS tablet TAKE ONE TABLET TWICE DAILY 180 tablet 3 07/28/2021   metoprolol tartrate (LOPRESSOR) 25 MG tablet Take 0.5 tablets (12.5 mg total) by mouth 2 (two) times daily. 90 tablet 2 07/30/2021   triamterene-hydrochlorothiazide (MAXZIDE-25) 37.5-25 MG tablet TAKE ONE TABLET BY MOUTH EVERY DAY 90 tablet 3 07/30/2021   Allergies  Allergen Reactions   Lisinopril     Uvula swelling---?angioedema   Past Medical History:  Diagnosis Date   Dysrhythmia    AFIB   GERD (gastroesophageal reflux disease)    Hepatitis C    Rx interferon/ribaviron 2004-'05 (recurred after Rx), treated and cleared with Harvoni 2016   Hypertension    Past Surgical History:  Procedure Laterality Date   APPENDECTOMY     CARDIOVERSION N/A 02/07/2019   Procedure: CARDIOVERSION;  Surgeon: Wellington Hampshire, MD;  Location: [facility] ORS;  Service: Cardiovascular;  Laterality: N/A;   COLONOSCOPY  09/2010   1 polyp-tubular adenoma   COLONOSCOPY N/A 05/02/2016   Procedure: COLONOSCOPY;  Surgeon: Robert Bellow, MD;  Location: [facility] ENDOSCOPY;  Service: Endoscopy;  Laterality: N/A;   LIVER BIOPSY     LUMBAR LAMINECTOMY     MENISCUS REPAIR Left 4/14   Social History   Socioeconomic History   Marital status: Married    Spouse name: Not on file   Number of children: 3   Years of education: Not on file   Highest education level: Not on file  Occupational History   Occupation: Futures trader, has farm    Comment: part time now  Tobacco Use   Smoking  status: Some Days    Packs/day: 0.50    Types: Cigarettes    Last attempt to quit: 07/16/2015    Years since quitting: 6.0   Smokeless tobacco: Never   Tobacco comments:    3-4 cigarettes per day  Vaping Use   Vaping Use: Never used  Substance and Sexual Activity   Alcohol use: Yes    Alcohol/week: 1.0 standard drink    Types: 1 Cans of beer per week    Comment: occassionally   Drug use: No   Sexual activity: Not on file  Other Topics Concern   Not on file  Social History Narrative   No living will   No formal health care POA--would want wife, then daughter   Would accept resuscitation attempts   Not sure about tube feeds   Social Determinants of Health   Financial Resource Strain: Not on file  Food Insecurity: Not on file  Transportation Needs: Not on file  Physical Activity: Not on file  Stress: Not on file  Social Connections: Not on file  Intimate Partner Violence: Not on file   Social History   Social History Narrative   No living will   No formal health care POA--would want wife, then daughter   Would accept resuscitation attempts   Not sure about tube feeds     ROS: Negative.     PE: HEENT: Negative. Lungs: Clear. Cardio: RR.  Assessment/Plan:  Proceed with planned endoscopy.   Forest Gleason [facility] 07/31/2021

## 2021-07-31 NOTE — Anesthesia Preprocedure Evaluation (Signed)
Anesthesia Evaluation  Patient identified by MRN, date of birth, ID band Patient awake    Reviewed: Allergy & Precautions, NPO status , Patient's Chart, lab work & pertinent test results  History of Anesthesia Complications Negative for: history of anesthetic complications  Airway Mallampati: II  TM Distance: >3 FB Neck ROM: full    Dental  (+) Chipped   Pulmonary neg pulmonary ROS, neg shortness of breath, COPD, neg recent URI, Current Smoker, former smoker,     + decreased breath sounds      Cardiovascular Exercise Tolerance: Good hypertension, Pt. on medications (-) angina(-) Past MI + dysrhythmias Atrial Fibrillation (-) Valvular Problems/Murmurs     Neuro/Psych negative neurological ROS  negative psych ROS   GI/Hepatic GERD  Medicated,(+) Cirrhosis       , Hepatitis -  Endo/Other  negative endocrine ROS  Renal/GU Renal disease (kidney stones)     Musculoskeletal   Abdominal Normal abdominal exam  (+)   Peds negative pediatric ROS (+)  Hematology negative hematology ROS (+)   Anesthesia Other Findings Past Medical History: No date: GERD (gastroesophageal reflux disease) No date: Hepatitis C     Comment:  Rx interferon/ribaviron 2004-'05 (recurred after Rx),               treated and cleared with Harvoni 2016 No date: Hypertension   Reproductive/Obstetrics                             Anesthesia Physical  Anesthesia Plan  ASA: 3  Anesthesia Plan: General   Post-op Pain Management:    Induction: Intravenous  PONV Risk Score and Plan: 2 and Propofol infusion and TIVA  Airway Management Planned: Natural Airway and Nasal Cannula  Additional Equipment:   Intra-op Plan:   Post-operative Plan:   Informed Consent: I have reviewed the patients History and Physical, chart, labs and discussed the procedure including the risks, benefits and alternatives for the proposed  anesthesia with the patient or authorized representative who has indicated his/her understanding and acceptance.     Dental Advisory Given  Plan Discussed with: CRNA  Anesthesia Plan Comments: (Patient consented for risks of anesthesia including but not limited to:  - adverse reactions to medications - risk of airway placement if required - damage to eyes, teeth, lips or other oral mucosa - nerve damage due to positioning  - sore throat or hoarseness - Damage to heart, brain, nerves, lungs, other parts of body or loss of life  Patient voiced understanding.)        Anesthesia Quick Evaluation

## 2021-07-31 NOTE — Op Note (Signed)
[facility] Gastroenterology Patient Name: Craig Calhoun Procedure Date: 07/31/2021 10:37 AM MRN: [id number] Account #: [id number] Date of Birth: 1950-07-28 Admit Type: Outpatient Age: 71 Room: [facility] ENDO ROOM 1 Gender: Male Note Status: Finalized Instrument Name: Peds Colonoscope [id number] Procedure:             Colonoscopy Indications:           High risk colon cancer surveillance: Personal history                         of colonic polyps Providers:             Robert Bellow, MD Referring MD:          Venia Carbon (Referring MD) Medicines:             Propofol per Anesthesia Complications:         No immediate complications. Procedure:             Pre-Anesthesia Assessment:                        - Prior to the procedure, a History and Physical was                         performed, and patient medications, allergies and                         sensitivities were reviewed. The patient's tolerance                         of previous anesthesia was reviewed.                        - The risks and benefits of the procedure and the                         sedation options and risks were discussed with the                         patient. All questions were answered and informed                         consent was obtained.                        After obtaining informed consent, the colonoscope was                         passed under direct vision. Throughout the procedure,                         the patient's blood pressure, pulse, and oxygen                         saturations were monitored continuously. The                         Colonoscope was introduced through the anus and                         advanced  to the the cecum, identified by appendiceal                         orifice and ileocecal valve. The colonoscopy was                         somewhat difficult due to restricted mobility of the                         colon. Successful  completion of the procedure was                         aided by changing the patient to a supine position.                         The patient tolerated the procedure well. The quality                         of the bowel preparation was excellent. Findings:      Multiple medium-mouthed diverticula were found in the recto-sigmoid       colon and sigmoid colon.      The retroflexed view of the distal rectum and anal verge was normal and       showed no anal or rectal abnormalities. Impression:            - Diverticulosis in the recto-sigmoid colon and in the                         sigmoid colon.                        - The distal rectum and anal verge are normal on                         retroflexion view.                        - No specimens collected. Recommendation:        - Repeat colonoscopy in 7 years for surveillance.                        RESUME ELIQUIS TONIGHT Procedure Code(s):     --- Professional ---                        [phone], Colonoscopy, flexible; diagnostic, including                         collection of specimen(s) by brushing or washing, when                         performed (separate procedure) Diagnosis Code(s):     --- Professional ---                        K57.30, Diverticulosis of large intestine without                         perforation or abscess without bleeding  Z86.010, Personal history of colonic polyps CPT copyright 2019 American Medical Association. All rights reserved. The codes documented in this report are preliminary and upon coder review may  be revised to meet current compliance requirements. Robert Bellow, MD 07/31/2021 11:20:49 AM This report has been signed electronically. Number of Addenda: 0 Note Initiated On: 07/31/2021 10:37 AM Scope Withdrawal Time: 0 hours 13 minutes 13 seconds  Total Procedure Duration: 0 hours 26 minutes 35 seconds  Estimated Blood Loss:  Estimated blood loss: none.      [facility]

## 2021-07-31 NOTE — Anesthesia Postprocedure Evaluation (Signed)
Anesthesia Post Note  Patient: Craig Calhoun  Procedure(s) Performed: COLONOSCOPY WITH PROPOFOL  Patient location during evaluation: Endoscopy Anesthesia Type: General Level of consciousness: awake and alert Pain management: pain level controlled Vital Signs Assessment: post-procedure vital signs reviewed and stable Respiratory status: spontaneous breathing, nonlabored ventilation, respiratory function stable and patient connected to nasal cannula oxygen Cardiovascular status: blood pressure returned to baseline and stable Postop Assessment: no apparent nausea or vomiting Anesthetic complications: no   No notable events documented.   Last Vitals:  Vitals:   07/31/21 1140 07/31/21 1150  BP: 123/70 140/90  Pulse: 70 64  Resp: (!) 25 14  Temp:    SpO2: 100% 100%    Last Pain:  Vitals:   07/31/21 1120  TempSrc: Temporal  PainSc:                  Precious Haws Mehran Guderian

## 2021-08-01 ENCOUNTER — Encounter: Payer: Self-pay | Admitting: General Surgery

## 2021-08-14 ENCOUNTER — Other Ambulatory Visit: Payer: Self-pay | Admitting: Nurse Practitioner

## 2021-08-14 DIAGNOSIS — K7469 Other cirrhosis of liver: Secondary | ICD-10-CM

## 2021-08-15 DIAGNOSIS — G5603 Carpal tunnel syndrome, bilateral upper limbs: Secondary | ICD-10-CM | POA: Diagnosis not present

## 2021-08-16 DIAGNOSIS — G5603 Carpal tunnel syndrome, bilateral upper limbs: Secondary | ICD-10-CM | POA: Diagnosis not present

## 2021-08-16 DIAGNOSIS — M25511 Pain in right shoulder: Secondary | ICD-10-CM | POA: Diagnosis not present

## 2021-08-16 DIAGNOSIS — M25512 Pain in left shoulder: Secondary | ICD-10-CM | POA: Diagnosis not present

## 2021-08-23 ENCOUNTER — Ambulatory Visit
Admission: RE | Admit: 2021-08-23 | Discharge: 2021-08-23 | Disposition: A | Discharge status: Home / Self Care | Payer: PPO | Source: Ambulatory Visit | Attending: Nurse Practitioner | Admitting: Nurse Practitioner

## 2021-08-23 DIAGNOSIS — K7460 Unspecified cirrhosis of liver: Secondary | ICD-10-CM | POA: Diagnosis not present

## 2021-08-23 DIAGNOSIS — K7469 Other cirrhosis of liver: Secondary | ICD-10-CM

## 2021-09-10 DIAGNOSIS — G5603 Carpal tunnel syndrome, bilateral upper limbs: Secondary | ICD-10-CM | POA: Diagnosis not present

## 2021-09-16 ENCOUNTER — Other Ambulatory Visit: Payer: Self-pay | Admitting: Family

## 2021-09-16 DIAGNOSIS — I4819 Other persistent atrial fibrillation: Secondary | ICD-10-CM

## 2021-09-16 NOTE — Telephone Encounter (Signed)
Attempted to schedule.  

## 2021-10-22 DIAGNOSIS — H903 Sensorineural hearing loss, bilateral: Secondary | ICD-10-CM | POA: Diagnosis not present

## 2021-11-14 DIAGNOSIS — M25512 Pain in left shoulder: Secondary | ICD-10-CM | POA: Diagnosis not present

## 2021-11-14 DIAGNOSIS — M25511 Pain in right shoulder: Secondary | ICD-10-CM | POA: Diagnosis not present

## 2021-11-15 ENCOUNTER — Encounter: Payer: Self-pay | Admitting: Cardiovascular Disease

## 2021-11-15 ENCOUNTER — Ambulatory Visit: Payer: PPO | Admitting: Cardiovascular Disease

## 2021-11-15 VITALS — BP 154/80 | HR 56 | Ht 74.0 in | Wt 188.0 lb

## 2021-11-15 DIAGNOSIS — K7469 Other cirrhosis of liver: Secondary | ICD-10-CM | POA: Diagnosis not present

## 2021-11-15 DIAGNOSIS — I10 Essential (primary) hypertension: Secondary | ICD-10-CM | POA: Diagnosis not present

## 2021-11-15 DIAGNOSIS — I429 Cardiomyopathy, unspecified: Secondary | ICD-10-CM

## 2021-11-15 DIAGNOSIS — I4819 Other persistent atrial fibrillation: Secondary | ICD-10-CM

## 2021-11-15 MED ORDER — CARVEDILOL 6.25 MG PO TABS: 6.2500 mg | ORAL_TABLET | Freq: Two times a day (BID) | ORAL | 3 refills | Status: DC

## 2021-11-15 NOTE — Patient Instructions (Signed)
Medication Instructions:  Your physician has recommended you make the following change in your medication:   STOP Metoprolol  START Carvedilol 6.25 mg twice a day. An Rx has been sent to your pharmacy  *If you need a refill on your cardiac medications before your next appointment, please call your pharmacy*   Lab Work: None ordered If you have labs (blood work) drawn today and your tests are completely normal, you will receive your results only by: [place] (if you have MyChart) OR A paper copy in the mail If you have any lab test that is abnormal or we need to change your treatment, we will call you to review the results.   Testing/Procedures: None ordered   Follow-Up: At [facility], you and your health needs are our priority.  As part of our continuing mission to provide you with exceptional heart care, we have created designated Provider Care Teams.  These Care Teams include your primary Cardiologist (physician) and Advanced Practice Providers (APPs -  Physician Assistants and Nurse Practitioners) who all work together to provide you with the care you need, when you need it.  We recommend signing up for the patient portal called "MyChart".  Sign up information is provided on this After Visit Summary.  MyChart is used to connect with patients for Virtual Visits (Telemedicine).  Patients are able to view lab/test results, encounter notes, upcoming appointments, etc.  Non-urgent messages can be sent to your provider as well.   To learn more about what you can do with MyChart, go to [email or web address].    Your next appointment:   6 month(s)  The format for your next appointment:   In Person  Provider:   You may see Kathlyn Sacramento, MD or one of the following Advanced Practice Providers on your designated Care Team:   Murray Hodgkins, NP Christell Faith, PA-C Cadence Kathlen Mody, [place]   Other Instructions N/A  Important Information About Sugar

## 2021-11-15 NOTE — Progress Notes (Signed)
Cardiology Office Note   Date:  11/15/2021   ID:  Craig Calhoun, Craig Calhoun 28-May-1951, MRN [id number]  PCP:  Venia Carbon, MD  Cardiologist:   Kathlyn Sacramento, MD   Chief Complaint  Patient presents with   Follow-up    6 month F/U-No new cardiac concerns      History of Present Illness: Craig Calhoun is a 71 y.o. male who is here today regarding persistent atrial fibrillation.    He has known history of hepatitis C which was treated, essential hypertension and tobacco use.  He quit smoking in 2020 but he continues to chew tobacco.  The patient underwent successful cardioversion in August of 2020 and has been in sinus rhythm since then.  He was found to have incidental COVID-19 positive without symptoms before then. Echocardiogram before cardioversion showed an EF of 40 to 45%.  A repeat echo post cardioversion showed improvement in EF to 50 to 55%.   He is tolerating anticoagulation with no side effects.   He has been doing well overall with no chest pain, shortness of breath or palpitations.  No side effects with anticoagulation.  He is mildly bradycardic but denies dizziness.    Past Medical History:  Diagnosis Date   Dysrhythmia    AFIB   GERD (gastroesophageal reflux disease)    Hepatitis C    Rx interferon/ribaviron 2004-'05 (recurred after Rx), treated and cleared with Harvoni 2016   Hypertension     Past Surgical History:  Procedure Laterality Date   APPENDECTOMY     CARDIOVERSION N/A 02/07/2019   Procedure: CARDIOVERSION;  Surgeon: Wellington Hampshire, MD;  Location: [facility] ORS;  Service: Cardiovascular;  Laterality: N/A;   COLONOSCOPY  09/2010   1 polyp-tubular adenoma   COLONOSCOPY N/A 05/02/2016   Procedure: COLONOSCOPY;  Surgeon: Robert Bellow, MD;  Location: [facility] ENDOSCOPY;  Service: Endoscopy;  Laterality: N/A;   COLONOSCOPY WITH PROPOFOL N/A 07/31/2021   Procedure: COLONOSCOPY WITH PROPOFOL;  Surgeon: Robert Bellow, MD;  Location: [facility]  ENDOSCOPY;  Service: Endoscopy;  Laterality: N/A;   LIVER BIOPSY     LUMBAR LAMINECTOMY     MENISCUS REPAIR Left 4/14     Current Outpatient Medications  Medication Sig Dispense Refill   carvedilol (COREG) 6.25 MG tablet Take 1 tablet (6.25 mg total) by mouth 2 (two) times daily. 180 tablet 3   diphenhydramine-acetaminophen (TYLENOL PM) 25-500 MG TABS tablet Take 1 tablet by mouth at bedtime as needed (sleep).     ELIQUIS 5 MG TABS tablet TAKE ONE TABLET TWICE DAILY 180 tablet 3   Melatonin 10 MG TABS Take 10 mg by mouth at bedtime.     triamterene-hydrochlorothiazide (MAXZIDE-25) 37.5-25 MG tablet TAKE ONE TABLET BY MOUTH EVERY DAY 90 tablet 3   No current facility-administered medications for this visit.    Allergies:   Lisinopril    Social History:  The patient  reports that he has been smoking cigarettes. He has been smoking an average of .5 packs per day. He has never used smokeless tobacco. He reports current alcohol use of about 1.0 standard drink per week. He reports that he does not use drugs.   Family History:  The patient's family history includes Breast cancer in his sister; Colon cancer in his maternal aunt; Diabetes in his mother; Heart disease in his father.    ROS:  Please see the history of present illness.   Otherwise, review of systems are positive for none.  All other systems are reviewed and negative.    PHYSICAL EXAM: VS:  BP (!) 154/80 (BP Location: Left Arm, Patient Position: Sitting, Cuff Size: Normal)   Pulse (!) 56   Ht '6\' 2"'$  (1.88 m)   Wt 188 lb (85.3 kg)   SpO2 95%   BMI 24.14 kg/m  , BMI Body mass index is 24.14 kg/m. GEN: Well nourished, well developed, in no acute distress  HEENT: normal  Neck: no JVD, carotid bruits, or masses Cardiac: Regular rate and rhythm; no murmurs, rubs, or gallops,no edema  Respiratory:  clear to auscultation bilaterally, normal work of breathing GI: soft, nontender, nondistended, + BS MS: no deformity or atrophy   Skin: warm and dry, no rash Neuro:  Strength and sensation are intact Psych: euthymic mood, full affect   EKG:  EKG is ordered today. The ekg ordered today demonstrates sinus bradycardia with sinus arrhythmia.   Recent Labs: 03/05/2021: ALT 16; BUN 17; Creatinine, Ser 0.91; Hemoglobin 14.4; Platelets 281.0; Potassium 4.1; Sodium 137    Lipid Panel    Component Value Date/Time   CHOL 182 12/17/2018 1400   TRIG 196.0 (H) 12/17/2018 1400   HDL 36.00 (L) 12/17/2018 1400   CHOLHDL 5 12/17/2018 1400   VLDL 39.2 12/17/2018 1400   LDLCALC 106 (H) 12/17/2018 1400      Wt Readings from Last 3 Encounters:  11/15/21 188 lb (85.3 kg)  07/31/21 187 lb 6.3 oz (85 kg)  03/05/21 186 lb (84.4 kg)         12/31/2018    3:45 PM  PAD Screen  Previous PAD dx? No  Previous surgical procedure? No  Pain with walking? No  Feet/toe relief with dangling? No  Painful, non-healing ulcers? No  Extremities discolored? No      ASSESSMENT AND PLAN:  1.  Persistent atrial fibrillation: Maintaining sinus rhythm status post cardioversion in 2020.  Given mild bradycardia and uncontrolled hypertension, I elected to switch metoprolol to carvedilol.  Continue anticoagulation with Eliquis.  2.  Mild cardiomyopathy: This was noted in the setting of atrial fibrillation but his EF improved to low normal after cardioversion.   3.  Essential hypertension: Blood pressure is elevated.  I switch metoprolol to carvedilol 6.25 mg twice daily.  4.  History of liver cirrhosis due to previous hepatitis C: The patient has been cured from hepatitis C and his cirrhosis seems to be very mild overall.      Disposition:   FU with me in 6 months  Signed,  Kathlyn Sacramento, MD  11/15/2021 8:42 AM    [place]

## 2021-12-11 DIAGNOSIS — M546 Pain in thoracic spine: Secondary | ICD-10-CM | POA: Diagnosis not present

## 2021-12-11 DIAGNOSIS — M9902 Segmental and somatic dysfunction of thoracic region: Secondary | ICD-10-CM | POA: Diagnosis not present

## 2021-12-11 DIAGNOSIS — M9903 Segmental and somatic dysfunction of lumbar region: Secondary | ICD-10-CM | POA: Diagnosis not present

## 2021-12-11 DIAGNOSIS — M9901 Segmental and somatic dysfunction of cervical region: Secondary | ICD-10-CM | POA: Diagnosis not present

## 2021-12-11 DIAGNOSIS — M5136 Other intervertebral disc degeneration, lumbar region: Secondary | ICD-10-CM | POA: Diagnosis not present

## 2021-12-12 DIAGNOSIS — M9903 Segmental and somatic dysfunction of lumbar region: Secondary | ICD-10-CM | POA: Diagnosis not present

## 2021-12-12 DIAGNOSIS — M9902 Segmental and somatic dysfunction of thoracic region: Secondary | ICD-10-CM | POA: Diagnosis not present

## 2021-12-12 DIAGNOSIS — M5136 Other intervertebral disc degeneration, lumbar region: Secondary | ICD-10-CM | POA: Diagnosis not present

## 2021-12-12 DIAGNOSIS — M9901 Segmental and somatic dysfunction of cervical region: Secondary | ICD-10-CM | POA: Diagnosis not present

## 2021-12-12 DIAGNOSIS — M546 Pain in thoracic spine: Secondary | ICD-10-CM | POA: Diagnosis not present

## 2021-12-13 DIAGNOSIS — M546 Pain in thoracic spine: Secondary | ICD-10-CM | POA: Diagnosis not present

## 2021-12-13 DIAGNOSIS — M5136 Other intervertebral disc degeneration, lumbar region: Secondary | ICD-10-CM | POA: Diagnosis not present

## 2021-12-13 DIAGNOSIS — M9903 Segmental and somatic dysfunction of lumbar region: Secondary | ICD-10-CM | POA: Diagnosis not present

## 2021-12-13 DIAGNOSIS — M9901 Segmental and somatic dysfunction of cervical region: Secondary | ICD-10-CM | POA: Diagnosis not present

## 2021-12-13 DIAGNOSIS — M9902 Segmental and somatic dysfunction of thoracic region: Secondary | ICD-10-CM | POA: Diagnosis not present

## 2021-12-16 DIAGNOSIS — M546 Pain in thoracic spine: Secondary | ICD-10-CM | POA: Diagnosis not present

## 2021-12-16 DIAGNOSIS — M9903 Segmental and somatic dysfunction of lumbar region: Secondary | ICD-10-CM | POA: Diagnosis not present

## 2021-12-16 DIAGNOSIS — M9901 Segmental and somatic dysfunction of cervical region: Secondary | ICD-10-CM | POA: Diagnosis not present

## 2021-12-16 DIAGNOSIS — M5136 Other intervertebral disc degeneration, lumbar region: Secondary | ICD-10-CM | POA: Diagnosis not present

## 2021-12-16 DIAGNOSIS — M9902 Segmental and somatic dysfunction of thoracic region: Secondary | ICD-10-CM | POA: Diagnosis not present

## 2021-12-19 DIAGNOSIS — M9903 Segmental and somatic dysfunction of lumbar region: Secondary | ICD-10-CM | POA: Diagnosis not present

## 2021-12-19 DIAGNOSIS — M9901 Segmental and somatic dysfunction of cervical region: Secondary | ICD-10-CM | POA: Diagnosis not present

## 2021-12-19 DIAGNOSIS — M9902 Segmental and somatic dysfunction of thoracic region: Secondary | ICD-10-CM | POA: Diagnosis not present

## 2021-12-19 DIAGNOSIS — M546 Pain in thoracic spine: Secondary | ICD-10-CM | POA: Diagnosis not present

## 2021-12-19 DIAGNOSIS — M5136 Other intervertebral disc degeneration, lumbar region: Secondary | ICD-10-CM | POA: Diagnosis not present

## 2021-12-26 DIAGNOSIS — M546 Pain in thoracic spine: Secondary | ICD-10-CM | POA: Diagnosis not present

## 2021-12-26 DIAGNOSIS — M9903 Segmental and somatic dysfunction of lumbar region: Secondary | ICD-10-CM | POA: Diagnosis not present

## 2021-12-26 DIAGNOSIS — M9902 Segmental and somatic dysfunction of thoracic region: Secondary | ICD-10-CM | POA: Diagnosis not present

## 2021-12-26 DIAGNOSIS — M5136 Other intervertebral disc degeneration, lumbar region: Secondary | ICD-10-CM | POA: Diagnosis not present

## 2021-12-31 DIAGNOSIS — M546 Pain in thoracic spine: Secondary | ICD-10-CM | POA: Diagnosis not present

## 2021-12-31 DIAGNOSIS — M5136 Other intervertebral disc degeneration, lumbar region: Secondary | ICD-10-CM | POA: Diagnosis not present

## 2021-12-31 DIAGNOSIS — M9902 Segmental and somatic dysfunction of thoracic region: Secondary | ICD-10-CM | POA: Diagnosis not present

## 2021-12-31 DIAGNOSIS — M9903 Segmental and somatic dysfunction of lumbar region: Secondary | ICD-10-CM | POA: Diagnosis not present

## 2022-01-07 DIAGNOSIS — M9902 Segmental and somatic dysfunction of thoracic region: Secondary | ICD-10-CM | POA: Diagnosis not present

## 2022-01-07 DIAGNOSIS — M9903 Segmental and somatic dysfunction of lumbar region: Secondary | ICD-10-CM | POA: Diagnosis not present

## 2022-01-07 DIAGNOSIS — M5136 Other intervertebral disc degeneration, lumbar region: Secondary | ICD-10-CM | POA: Diagnosis not present

## 2022-01-07 DIAGNOSIS — M546 Pain in thoracic spine: Secondary | ICD-10-CM | POA: Diagnosis not present

## 2022-01-09 DIAGNOSIS — M5136 Other intervertebral disc degeneration, lumbar region: Secondary | ICD-10-CM | POA: Diagnosis not present

## 2022-01-09 DIAGNOSIS — M9903 Segmental and somatic dysfunction of lumbar region: Secondary | ICD-10-CM | POA: Diagnosis not present

## 2022-01-09 DIAGNOSIS — M546 Pain in thoracic spine: Secondary | ICD-10-CM | POA: Diagnosis not present

## 2022-01-09 DIAGNOSIS — M9902 Segmental and somatic dysfunction of thoracic region: Secondary | ICD-10-CM | POA: Diagnosis not present

## 2022-01-14 DIAGNOSIS — M9903 Segmental and somatic dysfunction of lumbar region: Secondary | ICD-10-CM | POA: Diagnosis not present

## 2022-01-14 DIAGNOSIS — M546 Pain in thoracic spine: Secondary | ICD-10-CM | POA: Diagnosis not present

## 2022-01-14 DIAGNOSIS — M9902 Segmental and somatic dysfunction of thoracic region: Secondary | ICD-10-CM | POA: Diagnosis not present

## 2022-01-14 DIAGNOSIS — M5136 Other intervertebral disc degeneration, lumbar region: Secondary | ICD-10-CM | POA: Diagnosis not present

## 2022-01-29 ENCOUNTER — Other Ambulatory Visit: Payer: Self-pay | Admitting: Nurse Practitioner

## 2022-01-29 DIAGNOSIS — K7469 Other cirrhosis of liver: Secondary | ICD-10-CM

## 2022-01-31 ENCOUNTER — Ambulatory Visit
Admission: RE | Admit: 2022-01-31 | Discharge: 2022-01-31 | Disposition: A | Discharge status: Home / Self Care | Payer: PPO | Source: Ambulatory Visit | Attending: Nurse Practitioner | Admitting: Nurse Practitioner

## 2022-01-31 DIAGNOSIS — K7460 Unspecified cirrhosis of liver: Secondary | ICD-10-CM | POA: Diagnosis not present

## 2022-01-31 DIAGNOSIS — K7469 Other cirrhosis of liver: Secondary | ICD-10-CM

## 2022-03-06 ENCOUNTER — Encounter: Payer: PPO | Admitting: Internal Medicine

## 2022-03-12 DIAGNOSIS — M7541 Impingement syndrome of right shoulder: Secondary | ICD-10-CM | POA: Diagnosis not present

## 2022-03-12 DIAGNOSIS — M7542 Impingement syndrome of left shoulder: Secondary | ICD-10-CM | POA: Diagnosis not present

## 2022-03-21 ENCOUNTER — Other Ambulatory Visit: Payer: Self-pay | Admitting: Internal Medicine

## 2022-03-24 ENCOUNTER — Ambulatory Visit (INDEPENDENT_AMBULATORY_CARE_PROVIDER_SITE_OTHER): Payer: PPO | Admitting: Internal Medicine

## 2022-03-24 ENCOUNTER — Encounter: Payer: Self-pay | Admitting: Internal Medicine

## 2022-03-24 VITALS — BP 126/72 | HR 45 | Temp 97.8°F | Ht 71.5 in | Wt 184.0 lb

## 2022-03-24 DIAGNOSIS — L570 Actinic keratosis: Secondary | ICD-10-CM

## 2022-03-24 DIAGNOSIS — Z23 Encounter for immunization: Secondary | ICD-10-CM | POA: Diagnosis not present

## 2022-03-24 DIAGNOSIS — Z0000 Encounter for general adult medical examination without abnormal findings: Secondary | ICD-10-CM

## 2022-03-24 DIAGNOSIS — I10 Essential (primary) hypertension: Secondary | ICD-10-CM

## 2022-03-24 DIAGNOSIS — I480 Paroxysmal atrial fibrillation: Secondary | ICD-10-CM

## 2022-03-24 DIAGNOSIS — I700 Atherosclerosis of aorta: Secondary | ICD-10-CM

## 2022-03-24 DIAGNOSIS — K7460 Unspecified cirrhosis of liver: Secondary | ICD-10-CM | POA: Diagnosis not present

## 2022-03-24 NOTE — Assessment & Plan Note (Signed)
BP Readings from Last 3 Encounters:  03/24/22 126/72  11/15/21 (!) 154/80  07/31/21 140/90   Controlled on the carvedilol and triamterine/HCTZ

## 2022-03-24 NOTE — Assessment & Plan Note (Signed)
Still regular on carvedilol 6.25 bid On eliquis 5 bid

## 2022-03-24 NOTE — Assessment & Plan Note (Signed)
I have personally reviewed the Medicare Annual Wellness questionnaire and have noted  1. The patient's medical and social history  2. Their use of alcohol, tobacco or illicit drugs  3. Their current medications and supplements  4. The patient's functional ability including ADL's, fall risks, home safety risks and hearing or visual              impairment.  5. Diet and physical activities  6. Evidence for depression or mood disorders  The patients weight, height, BMI and visual acuity have been recorded in the chart  I have made referrals, counseling and provided education to the patient based review of the above and I have provided the pt with a written personalized care plan for preventive services.   I have provided you with a copy of your personalized plan for preventive services. Please take the time to review along with your updated medication list.  Discussed stopping the smoking Does walk regularly Consider last colon in 2030 No more PSA testing Considering flu vaccine--I recommend it. Give today Doesn't want the COVID vaccine Consider shingrix at pharmacy

## 2022-03-24 NOTE — Progress Notes (Signed)
Subjective:    Patient ID: Craig Calhoun, male    DOB: 24-Jun-1950, 71 y.o.   MRN: [id number]  HPI Here for Medicare wellness visit and follow up of chronic health conditions Reviewed advanced directives Reviewed other doctors---Dr Soria/Bowers, Dr Babs Bertin, Ms Drazek--hepatology, Dr Dumayne-chiropractic, Dr Arida--cardiology, Dr Olivia Mackie No hospitalizations or surgery in the past year Still smokes 4-5 cigarettes per day--discussed cessation (relates to anxiety) Rare alcohol Vision is okay--needs new eye doctor Hearing aides do help Tries to walk regularly No falls No depression or anhedonia Independent with instrumental ADLs Mild short term memory things--sounds like recall  Has a spot on his leg---some soreness Goes back 3 weeks No bite that he knows of  Some pain in shoulders ---since last year Has seen ortho---getting Rx with injection/PT Has been dealing with that  No palpitations No chest pain No SOB---but does note some DOE (relates to smoking) No edema  Sees Dawn Drazek----6 month ultrasounds for hepatoma surveillance Labs okay  Known aortic atherosclerosis--on imaging  Current Outpatient Medications on File Prior to Visit  Medication Sig Dispense Refill   carvedilol (COREG) 6.25 MG tablet Take 1 tablet (6.25 mg total) by mouth 2 (two) times daily. 180 tablet 3   diphenhydramine-acetaminophen (TYLENOL PM) 25-500 MG TABS tablet Take 1 tablet by mouth at bedtime as needed (sleep).     ELIQUIS 5 MG TABS tablet TAKE ONE TABLET TWICE DAILY 180 tablet 3   Melatonin 10 MG TABS Take 10 mg by mouth at bedtime.     triamterene-hydrochlorothiazide (MAXZIDE-25) 37.5-25 MG tablet TAKE ONE TABLET BY MOUTH EVERY DAY 90 tablet 3   No current facility-administered medications on file prior to visit.    Allergies  Allergen Reactions   Lisinopril     Uvula swelling---?angioedema    Past Medical History:  Diagnosis Date   Dysrhythmia    AFIB   GERD  (gastroesophageal reflux disease)    Hepatitis C    Rx interferon/ribaviron 2004-'05 (recurred after Rx), treated and cleared with Harvoni 2016   Hypertension     Past Surgical History:  Procedure Laterality Date   APPENDECTOMY     CARDIOVERSION N/A 02/07/2019   Procedure: CARDIOVERSION;  Surgeon: Wellington Hampshire, MD;  Location: [facility] ORS;  Service: Cardiovascular;  Laterality: N/A;   COLONOSCOPY  09/2010   1 polyp-tubular adenoma   COLONOSCOPY N/A 05/02/2016   Procedure: COLONOSCOPY;  Surgeon: Robert Bellow, MD;  Location: [facility] ENDOSCOPY;  Service: Endoscopy;  Laterality: N/A;   COLONOSCOPY WITH PROPOFOL N/A 07/31/2021   Procedure: COLONOSCOPY WITH PROPOFOL;  Surgeon: Robert Bellow, MD;  Location: [facility] ENDOSCOPY;  Service: Endoscopy;  Laterality: N/A;   LIVER BIOPSY     LUMBAR LAMINECTOMY     MENISCUS REPAIR Left 4/14    Family History  Problem Relation Age of Onset   Diabetes Mother    Heart disease Father    Breast cancer Sister    Colon cancer Maternal Aunt    Hypertension Neg Hx    Cancer Neg Hx        colon or prostate cancer    Social History   Socioeconomic History   Marital status: Married    Spouse name: Not on file   Number of children: 3   Years of education: Not on file   Highest education level: Not on file  Occupational History   Occupation: Futures trader, has farm    Comment: part time now  Tobacco Use   Smoking status: Some  Days    Packs/day: 0.50    Types: Cigarettes    Last attempt to quit: 07/16/2015    Years since quitting: 6.6    Passive exposure: Past   Smokeless tobacco: Never   Tobacco comments:    3-4 cigarettes per day  Vaping Use   Vaping Use: Never used  Substance and Sexual Activity   Alcohol use: Yes    Alcohol/week: 1.0 standard drink of alcohol    Types: 1 Cans of beer per week    Comment: occassionally   Drug use: No   Sexual activity: Not on file  Other Topics Concern   Not on file  Social History  Narrative   No living will   No formal health care POA--would want wife, then daughter   Would accept resuscitation attempts   Not sure about tube feeds   Social Determinants of Health   Financial Resource Strain: Not on file  Food Insecurity: Not on file  Transportation Needs: Not on file  Physical Activity: Not on file  Stress: Not on file  Social Connections: Not on file  Intimate Partner Violence: Not on file   Review of Systems Appetite is down some Weight is stable Sleeps okay with melatonin. Rare tylenol PM Wears seat belt Teeth okay---sees dentist No heartburn or dysphagia Bowels move okay--did have short spell with hemorrhoids---prep H helped Voids okay---stream is okay, empties okay No other skin lesions    Objective:   Physical Exam Constitutional:      Appearance: Normal appearance.  HENT:     Mouth/Throat:     Comments: No lesions Eyes:     Conjunctiva/sclera: Conjunctivae normal.     Pupils: Pupils are equal, round, and reactive to light.  Cardiovascular:     Rate and Rhythm: Regular rhythm. Bradycardia present.     Pulses: Normal pulses.     Heart sounds: No murmur heard.    No gallop.  Pulmonary:     Effort: Pulmonary effort is normal.     Breath sounds: Normal breath sounds. No wheezing or rales.  Abdominal:     Palpations: Abdomen is soft.     Tenderness: There is no abdominal tenderness.  Musculoskeletal:     Cervical back: Neck supple.     Right lower leg: No edema.     Left lower leg: No edema.  Lymphadenopathy:     Cervical: No cervical adenopathy.  Skin:    Findings: No rash.     Comments: 8 mm scaly lesion on posterior right calf  Neurological:     General: No focal deficit present.     Mental Status: He is alert and oriented to person, place, and time.     Comments: Mini-cog normal  Psychiatric:        Mood and Affect: Mood normal.        Behavior: Behavior normal.            Assessment & Plan:

## 2022-03-24 NOTE — Addendum Note (Signed)
Addended by: Pilar Grammes on: 03/24/2022 10:51 AM   Modules accepted: Orders

## 2022-03-24 NOTE — Assessment & Plan Note (Signed)
Suspicious for early carcinoma Discussed options Verbal consent---cryotherapy with liquid nitrogen for 35 seconds x 2 Tolerated well Discussed care Needs excision if recurs

## 2022-03-24 NOTE — Progress Notes (Signed)
Vision Screening   Right eye Left eye Both eyes  Without correction '20/25 20/25 20/25 '$  With correction     Hearing Screening - Comments:: Has hearing aids. Not wearing them today.

## 2022-03-24 NOTE — Assessment & Plan Note (Signed)
On imaging No statin due to cirrhosis

## 2022-03-24 NOTE — Assessment & Plan Note (Signed)
Stable function Does get hepatoma screening

## 2022-04-11 ENCOUNTER — Other Ambulatory Visit: Payer: Self-pay | Admitting: Internal Medicine

## 2022-05-22 ENCOUNTER — Ambulatory Visit: Payer: PPO | Admitting: Cardiovascular Disease

## 2022-07-24 ENCOUNTER — Ambulatory Visit: Payer: PPO | Admitting: Cardiovascular Disease

## 2022-07-25 ENCOUNTER — Ambulatory Visit: Payer: PPO | Admitting: Cardiovascular Disease

## 2022-07-28 ENCOUNTER — Ambulatory Visit: Payer: PPO | Attending: Cardiovascular Disease | Admitting: Medical

## 2022-07-28 ENCOUNTER — Encounter: Payer: Self-pay | Admitting: Medical

## 2022-07-28 VITALS — BP 160/80 | HR 52 | Ht 74.0 in | Wt 188.0 lb

## 2022-07-28 DIAGNOSIS — I429 Cardiomyopathy, unspecified: Secondary | ICD-10-CM | POA: Diagnosis not present

## 2022-07-28 DIAGNOSIS — I4819 Other persistent atrial fibrillation: Secondary | ICD-10-CM | POA: Diagnosis not present

## 2022-07-28 DIAGNOSIS — I10 Essential (primary) hypertension: Secondary | ICD-10-CM | POA: Diagnosis not present

## 2022-07-28 DIAGNOSIS — K7469 Other cirrhosis of liver: Secondary | ICD-10-CM

## 2022-07-28 NOTE — Patient Instructions (Signed)
Medication Instructions:  Your physician recommends that you continue on your current medications as directed. Please refer to the Current Medication list given to you today.  *If you need a refill on your cardiac medications before your next appointment, please call your pharmacy*   Lab Work: No labs ordered  If you have labs (blood work) drawn today and your tests are completely normal, you will receive your results only by: [place] (if you have MyChart) OR A paper copy in the mail If you have any lab test that is abnormal or we need to change your treatment, we will call you to review the results.   Testing/Procedures: No testing ordered  Follow-Up: At [facility], you and your health needs are our priority.  As part of our continuing mission to provide you with exceptional heart care, we have created designated Provider Care Teams.  These Care Teams include your primary Cardiologist (physician) and Advanced Practice Providers (APPs -  Physician Assistants and Nurse Practitioners) who all work together to provide you with the care you need, when you need it.  We recommend signing up for the patient portal called "MyChart".  Sign up information is provided on this After Visit Summary.  MyChart is used to connect with patients for Virtual Visits (Telemedicine).  Patients are able to view lab/test results, encounter notes, upcoming appointments, etc.  Non-urgent messages can be sent to your provider as well.   To learn more about what you can do with MyChart, go to [email or web address].    Your next appointment:   6 month(s)  Provider:   Kathlyn Sacramento, MD

## 2022-07-28 NOTE — Progress Notes (Signed)
Cardiology Office Note:    Date:  07/28/2022   ID:  Craig Calhoun, DOB 1950/09/10, MRN [id number]  PCP:  Venia Carbon, MD  [facility] Cardiologist:  Kathlyn Sacramento, MD  [facility] Electrophysiologist:  None   Referring MD: Venia Carbon, MD   Chief Complaint: 6 month follow-up  History of Present Illness:    Craig Calhoun is a 72 y.o. male with a hx of persistent afib, hepatitis C, HTN, tobacco use who presents for 6 month follow-up.   The patient underwent successful cardioversion in August 2020 and has been in NSR since then. He was found to have incidental COVID at the time. Echo before cardioversion showed LVEF 40-45%. A repeat echo post-cardioversion showed improvement in LVEF 50-55%. He is tolerating anticoagulation with no side effects.   He was last seen 10/2021 and had no chest pain, SOB or palpitations. He was mildly bradycardic and metoprolol was switched to Coreg.   Today, the patient is overall doing OK from a cardiac perspective. He denies chest pain, SOB, palpitations, LLE, orthopnea or  pnd. BP is mildly elevated today, which he says is normal when he comes to the office. He had medications this AM. Does not need refills. The patient is smoking 3-4 cigarettes. He is still working, he walks a lot. Diet is good. Re-check BP 145/70.  Past Medical History:  Diagnosis Date   Dysrhythmia    AFIB   GERD (gastroesophageal reflux disease)    Hepatitis C    Rx interferon/ribaviron 2004-'05 (recurred after Rx), treated and cleared with Harvoni 2016   Hypertension     Past Surgical History:  Procedure Laterality Date   APPENDECTOMY     CARDIOVERSION N/A 02/07/2019   Procedure: CARDIOVERSION;  Surgeon: Wellington Hampshire, MD;  Location: [facility] ORS;  Service: Cardiovascular;  Laterality: N/A;   COLONOSCOPY  09/2010   1 polyp-tubular adenoma   COLONOSCOPY N/A 05/02/2016   Procedure: COLONOSCOPY;  Surgeon: Robert Bellow, MD;  Location: [facility] ENDOSCOPY;   Service: Endoscopy;  Laterality: N/A;   COLONOSCOPY WITH PROPOFOL N/A 07/31/2021   Procedure: COLONOSCOPY WITH PROPOFOL;  Surgeon: Robert Bellow, MD;  Location: [facility] ENDOSCOPY;  Service: Endoscopy;  Laterality: N/A;   LIVER BIOPSY     LUMBAR LAMINECTOMY     MENISCUS REPAIR Left 4/14    Current Medications: Current Meds  Medication Sig   carvedilol (COREG) 6.25 MG tablet Take 1 tablet (6.25 mg total) by mouth 2 (two) times daily.   diphenhydramine-acetaminophen (TYLENOL PM) 25-500 MG TABS tablet Take 1 tablet by mouth at bedtime as needed (sleep).   ELIQUIS 5 MG TABS tablet TAKE ONE TABLET TWICE DAILY   Melatonin 10 MG TABS Take 10 mg by mouth at bedtime.   triamterene-hydrochlorothiazide (MAXZIDE-25) 37.5-25 MG tablet TAKE ONE TABLET BY MOUTH EVERY DAY     Allergies:   Lisinopril   Social History   Socioeconomic History   Marital status: Married    Spouse name: Not on file   Number of children: 3   Years of education: Not on file   Highest education level: Not on file  Occupational History   Occupation: Futures trader, has farm    Comment: part time now  Tobacco Use   Smoking status: Some Days    Packs/day: 0.50    Types: Cigarettes    Last attempt to quit: 07/16/2015    Years since quitting: 7.0    Passive exposure: Past   Smokeless tobacco: Never  Tobacco comments:    3-4 cigarettes per day  Vaping Use   Vaping Use: Never used  Substance and Sexual Activity   Alcohol use: Yes    Alcohol/week: 1.0 standard drink of alcohol    Types: 1 Cans of beer per week    Comment: occassionally   Drug use: No   Sexual activity: Not on file  Other Topics Concern   Not on file  Social History Narrative   No living will   No formal health care POA--would want wife, then daughter   Would accept resuscitation attempts   Not sure about tube feeds   Social Determinants of Health   Financial Resource Strain: Not on file  Food Insecurity: Not on file   Transportation Needs: Not on file  Physical Activity: Not on file  Stress: Not on file  Social Connections: Not on file     Family History: The patient's family history includes Breast cancer in his sister; Colon cancer in his maternal aunt; Diabetes in his mother; Heart disease in his father. There is no history of Hypertension or Cancer.  ROS:   Please see the history of present illness.     All other systems reviewed and are negative.  EKGs/Labs/Other Studies Reviewed:    The following studies were reviewed today:  Echo limited 02/2019 1. Left ventricular ejection fraction, by visual estimation, is 50 to  55%. The left ventricle has normal function. There is no left ventricular  hypertrophy.   2. Global right ventricle has normal systolic function.The right  ventricular size is normal. Right vetricular wall thickness was not  assessed.   3. Left atrial size was normal.   4. Right atrial size was normal.   5. The mitral valve is degenerative. Trace mitral valve regurgitation.   6. The tricuspid valve is normal in structure. Tricuspid valve  regurgitation was not visualized by color flow Doppler.   7. The aortic valve is tricuspid Aortic valve regurgitation was not  visualized by color flow Doppler.   8. The pulmonic valve was not well visualized. Pulmonic valve  regurgitation was not assessed by color flow Doppler.   9. Aortic dilatation noted.  10. There is mild dilatation of the aortic root measuring 39 mm.   Echo 01/2019 1. The left ventricle has mild-moderately reduced systolic function, with  an ejection fraction of 40-45%. The cavity size was normal. There is  mildly increased left ventricular wall thickness. Left ventricular  diastolic function could not be evaluated  secondary to atrial fibrillation. Left ventricular diffuse hypokinesis.   2. Left atrial size was mildly dilated.   3. Right atrial size was mildly dilated.   4. The mitral valve is degenerative.  Mild thickening of the mitral valve  leaflet. Mitral valve regurgitation is mild to moderate by color flow  Doppler.   5. The aortic valve was not well visualized.   6. The aorta is abnormal in size and structure.   7. There is mild dilatation of the ascending aorta measuring 39 mm.   8. The interatrial septum was not well visualized.   EKG:  EKG is ordered today.  The ekg ordered today demonstrates SB 52bpm, PAC, no ST/T wave changes  Recent Labs: No results found for requested labs within last 365 days.  Recent Lipid Panel    Component Value Date/Time   CHOL 182 12/17/2018 1400   TRIG 196.0 (H) 12/17/2018 1400   HDL 36.00 (L) 12/17/2018 1400   CHOLHDL 5 12/17/2018 1400  VLDL 39.2 12/17/2018 1400   LDLCALC 106 (H) 12/17/2018 1400    Physical Exam:    VS:  BP (!) 160/80 (BP Location: Left Arm, Patient Position: Sitting, Cuff Size: Normal)   Pulse (!) 52   Ht '6\' 2"'$  (1.88 m)   Wt 188 lb (85.3 kg)   SpO2 98%   BMI 24.14 kg/m     Wt Readings from Last 3 Encounters:  07/28/22 188 lb (85.3 kg)  03/24/22 184 lb (83.5 kg)  11/15/21 188 lb (85.3 kg)     GEN:  Well nourished, well developed in no acute distress HEENT: Normal NECK: No JVD; No carotid bruits LYMPHATICS: No lymphadenopathy CARDIAC: RR, bradycardia, no murmurs, rubs, gallops RESPIRATORY:  Clear to auscultation without rales, wheezing or rhonchi  ABDOMEN: Soft, non-tender, non-distended MUSCULOSKELETAL:  No edema; No deformity  SKIN: Warm and dry NEUROLOGIC:  Alert and oriented x 3 PSYCHIATRIC:  Normal affect   ASSESSMENT:    1. Persistent atrial fibrillation (HCC)   2. Cardiomyopathy, unspecified type ([place])   3. Essential hypertension   4. Other cirrhosis of liver (HCC)    PLAN:    In order of problems listed above:  Persistent Afib The patient is in sinus bradycardia today on EKG. Continue Eliquis '5mg'$  BID for stroke ppx. PCP did labs earlier this year. Continue Coreg for rate control.   Mild  CM Improved EF by echo in 02/2019. The patient is euvolemic on exam. Continue BB therapy.  HTN BP is elevated today, re-check 145/70. No changes for today. Continue Coreg and Maxzide.   H/o liver cirrhosiss due to previous hepatitis C He follows up once a year for this.  Disposition: Follow up in 6 month(s) with MD    Signed, Alitzel Cookson Ninfa Meeker, PA-C  07/28/2022 11:35 AM    [facility]

## 2022-08-04 ENCOUNTER — Other Ambulatory Visit: Payer: Self-pay | Admitting: Nurse Practitioner

## 2022-08-04 DIAGNOSIS — K7469 Other cirrhosis of liver: Secondary | ICD-10-CM

## 2022-08-08 ENCOUNTER — Other Ambulatory Visit: Payer: Self-pay | Admitting: Cardiovascular Disease

## 2022-08-13 ENCOUNTER — Other Ambulatory Visit: Payer: PPO

## 2022-08-26 ENCOUNTER — Ambulatory Visit
Admission: RE | Admit: 2022-08-26 | Discharge: 2022-08-26 | Disposition: A | Discharge status: Home / Self Care | Payer: PPO | Source: Ambulatory Visit | Attending: Nurse Practitioner | Admitting: Nurse Practitioner

## 2022-08-26 DIAGNOSIS — K7469 Other cirrhosis of liver: Secondary | ICD-10-CM

## 2022-08-26 DIAGNOSIS — K7460 Unspecified cirrhosis of liver: Secondary | ICD-10-CM | POA: Diagnosis not present

## 2022-08-26 DIAGNOSIS — K824 Cholesterolosis of gallbladder: Secondary | ICD-10-CM | POA: Diagnosis not present

## 2023-01-13 ENCOUNTER — Telehealth: Payer: Self-pay

## 2023-01-13 NOTE — Telephone Encounter (Signed)
Reached out to patient and scheduled follow up visit.  Elijio Miles [facility]

## 2023-01-26 ENCOUNTER — Encounter: Payer: Self-pay | Admitting: Internal Medicine

## 2023-01-26 ENCOUNTER — Ambulatory Visit (INDEPENDENT_AMBULATORY_CARE_PROVIDER_SITE_OTHER): Payer: PPO | Admitting: Internal Medicine

## 2023-01-26 VITALS — BP 124/68 | HR 66 | Temp 98.0°F | Ht 74.0 in | Wt 191.0 lb

## 2023-01-26 DIAGNOSIS — I480 Paroxysmal atrial fibrillation: Secondary | ICD-10-CM

## 2023-01-26 DIAGNOSIS — R0609 Other forms of dyspnea: Secondary | ICD-10-CM

## 2023-01-26 NOTE — Assessment & Plan Note (Signed)
Seems to be stable Now back in atrial fib--but seems to have preceded this May have some degree of lung disease--but I don't see a stress test at all I will contact Dr Kirke Corin about this

## 2023-01-26 NOTE — Assessment & Plan Note (Signed)
Seems to be back in atrial fib (by auscultation) No sig symptoms Will just contact Dr Arida---not sure more action is needed

## 2023-01-26 NOTE — Progress Notes (Signed)
Subjective:    Patient ID: Craig Calhoun, male    DOB: August 01, 1950, 72 y.o.   MRN: [id number]  HPI Here due to past elevated BP --and some SOB with exertion  He rarely checks his BP Doesn't remember last time or numbers  He does note that he gives out if he is pulling a heavy load of trash to the curb Does some yard work and physical work at [employer] He had cut way back on smoking---attributes his SOB to this Not much cough. No wheezing No chest pain No real palpitations  Current Outpatient Medications on File Prior to Visit  Medication Sig Dispense Refill   carvedilol (COREG) 6.25 MG tablet TAKE 1 TABLET BY MOUTH TWICE DAILY *STOPMETOPROLOL* 180 tablet 3   diphenhydramine-acetaminophen (TYLENOL PM) 25-500 MG TABS tablet Take 1 tablet by mouth at bedtime as needed (sleep).     ELIQUIS 5 MG TABS tablet TAKE ONE TABLET TWICE DAILY 180 tablet 3   Melatonin 10 MG TABS Take 10 mg by mouth at bedtime.     triamterene-hydrochlorothiazide (MAXZIDE-25) 37.5-25 MG tablet TAKE ONE TABLET BY MOUTH EVERY DAY 90 tablet 3   No current facility-administered medications on file prior to visit.    Allergies  Allergen Reactions   Lisinopril     Uvula swelling---?angioedema    Past Medical History:  Diagnosis Date   Dysrhythmia    AFIB   GERD (gastroesophageal reflux disease)    Hepatitis C    Rx interferon/ribaviron 2004-'05 (recurred after Rx), treated and cleared with Harvoni 2016   Hypertension     Past Surgical History:  Procedure Laterality Date   APPENDECTOMY     CARDIOVERSION N/A 02/07/2019   Procedure: CARDIOVERSION;  Surgeon: [name], MD;  Location: [facility] ORS;  Service: Cardiovascular;  Laterality: N/A;   COLONOSCOPY  09/2010   1 polyp-tubular adenoma   COLONOSCOPY N/A 05/02/2016   Procedure: COLONOSCOPY;  Surgeon: [name], MD;  Location: [facility] ENDOSCOPY;  Service: Endoscopy;  Laterality: N/A;   COLONOSCOPY WITH PROPOFOL N/A 07/31/2021    Procedure: COLONOSCOPY WITH PROPOFOL;  Surgeon: [name], MD;  Location: [facility] ENDOSCOPY;  Service: Endoscopy;  Laterality: N/A;   LIVER BIOPSY     LUMBAR LAMINECTOMY     MENISCUS REPAIR Left 4/14    Family History  Problem Relation Age of Onset   Diabetes Mother    Heart disease Father    Breast cancer Sister    Colon cancer Maternal Aunt    Hypertension Neg Hx    Cancer Neg Hx        colon or prostate cancer    Social History   Socioeconomic History   Marital status: Married    Spouse name: Not on file   Number of children: 3   Years of education: Not on file   Highest education level: Not on file  Occupational History   Occupation: Games developer, has farm    Comment: part time now  Tobacco Use   Smoking status: Some Days    Current packs/day: 0.00    Types: Cigarettes    Last attempt to quit: 07/16/2015    Years since quitting: 7.5    Passive exposure: Past   Smokeless tobacco: Never   Tobacco comments:    3-4 cigarettes per day  Vaping Use   Vaping status: Never Used  Substance and Sexual Activity   Alcohol use: Yes    Alcohol/week: 1.0 standard drink of alcohol  Types: 1 Cans of beer per week    Comment: occassionally   Drug use: No   Sexual activity: Not on file  Other Topics Concern   Not on file  Social History Narrative   No living will   No formal health care POA--would want wife, then daughter   Would accept resuscitation attempts   Not sure about tube feeds   Social Determinants of Health   Financial Resource Strain: Low Risk  (01/29/2022)   [facility]   Overall Financial Resource Strain (CARDIA)    Difficulty of Paying Living Expenses: Not hard at all  Food Insecurity: No Food Insecurity (01/29/2022)   [facility]   Hunger Vital Sign    Worried About Running Out of Food in the Last Year: Never true    Ran Out of Food in the Last Year: Never true  Transportation  Needs: No Transportation Needs (01/29/2022)   Received from [facility]   PRAPARE - Transportation    Lack of Transportation (Medical): No    Lack of Transportation (Non-Medical): No  Physical Activity: Not on file  Stress: Not on file  Social Connections: Not on file  Intimate Partner Violence: Not on file   Review of Systems Sleeps on 1 pillow No edema Sleeps okay with melatonin Appetite is good    Objective:   Physical Exam Constitutional:      Appearance: Normal appearance.  Cardiovascular:     Rate and Rhythm: Normal rate. Rhythm irregular.     Heart sounds: No murmur heard.    No gallop.  Pulmonary:     Effort: Pulmonary effort is normal.     Breath sounds: Normal breath sounds. No wheezing or rales.  Musculoskeletal:     Cervical back: Neck supple.     Right lower leg: No edema.     Left lower leg: No edema.  Lymphadenopathy:     Cervical: No cervical adenopathy.  Neurological:     Mental Status: He is alert.            Assessment & Plan:

## 2023-02-02 ENCOUNTER — Telehealth: Payer: Self-pay | Admitting: Cardiovascular Disease

## 2023-02-02 NOTE — Telephone Encounter (Signed)
Patient states he was seen at pcp office and the doctor tod him he may be back in A-fib. Patient states he did not have a EKG. Patient advised that he was due for a f/u per is last AVS and appt scheduled. Patient advised to call our office to be seen sooner if anything changes. Patient has OV schedule on 11/21 with Dr. Kirke Corin.

## 2023-02-02 NOTE — Telephone Encounter (Signed)
Pt is requesting a callback regarding his most recent office visit with his PCP. Please advise

## 2023-02-06 ENCOUNTER — Telehealth: Payer: Self-pay | Admitting: *Deleted

## 2023-02-06 DIAGNOSIS — I480 Paroxysmal atrial fibrillation: Secondary | ICD-10-CM

## 2023-02-06 NOTE — Telephone Encounter (Signed)
Patient has been made aware and agreeable to an echo. Order placed and message sent to scheduling.    [name], MD  [name], MD; Sandi Mariscal, RN Misty Stanley, Please schedule this patient for an echocardiogram (paroxysmal atrial fibrillation) to ensure stable ejection fraction given previous cardiomyopathy when he was in A-fib.  Schedule an office follow-up visit after the echo. Thanks.

## 2023-03-03 ENCOUNTER — Ambulatory Visit: Payer: PPO | Attending: Cardiovascular Disease

## 2023-03-03 DIAGNOSIS — I480 Paroxysmal atrial fibrillation: Secondary | ICD-10-CM | POA: Diagnosis not present

## 2023-03-03 LAB — ECHOCARDIOGRAM COMPLETE
MV M vel: 5.63 m/s
MV Peak grad: 126.8 mmHg
Radius: 0.4 cm
S' Lateral: 3.3 cm

## 2023-03-05 ENCOUNTER — Ambulatory Visit: Payer: PPO | Attending: Cardiovascular Disease | Admitting: Cardiovascular Disease

## 2023-03-05 ENCOUNTER — Encounter: Payer: Self-pay | Admitting: Cardiovascular Disease

## 2023-03-05 VITALS — BP 148/90 | HR 69 | Ht 74.0 in | Wt 193.2 lb

## 2023-03-05 DIAGNOSIS — I480 Paroxysmal atrial fibrillation: Secondary | ICD-10-CM | POA: Diagnosis not present

## 2023-03-05 DIAGNOSIS — I4819 Other persistent atrial fibrillation: Secondary | ICD-10-CM

## 2023-03-05 DIAGNOSIS — I700 Atherosclerosis of aorta: Secondary | ICD-10-CM

## 2023-03-05 DIAGNOSIS — I77810 Thoracic aortic ectasia: Secondary | ICD-10-CM

## 2023-03-05 DIAGNOSIS — I5022 Chronic systolic (congestive) heart failure: Secondary | ICD-10-CM

## 2023-03-05 DIAGNOSIS — I10 Essential (primary) hypertension: Secondary | ICD-10-CM | POA: Diagnosis not present

## 2023-03-05 DIAGNOSIS — R072 Precordial pain: Secondary | ICD-10-CM | POA: Diagnosis not present

## 2023-03-05 MED ORDER — SACUBITRIL-VALSARTAN 24-26 MG PO TABS: 1.0000 | ORAL_TABLET | Freq: Two times a day (BID) | ORAL | 1 refills | Status: DC

## 2023-03-05 NOTE — H&P (View-Only) (Signed)
Cardiology Office Note   Date:  03/05/2023   ID:  Iori, Caracappa 1951-03-04, MRN [id number]  PCP:  [name], MD  Cardiologist:   [name], MD   Chief Complaint  Patient presents with   Follow-up    F/u echo results and possible cardioversion c/o sob w/exertion. Meds reviewed verbally with pt.      History of Present Illness: NAJIB LOCASTRO is a 72 y.o. male who is here today regarding persistent atrial fibrillation.    He has known history of hepatitis C which was treated, essential hypertension and tobacco use.  He quit smoking in 2020 but he continues to chew tobacco.  The patient underwent successful cardioversion in August of 2020 and has been in sinus rhythm since then.  He was found to have incidental COVID-19 positive without symptoms before then. Echocardiogram before cardioversion showed an EF of 40 to 45%.  A repeat echo post cardioversion showed improvement in EF to 50 to 55%.    He was seen by [name] recently and was noted to have irregular rhythm.  I scheduled him for an echocardiogram which showed an EF of 40 to 45%, moderate mitral regurgitation and mildly dilated ascending aorta. Over the last few weeks, he experienced significant worsening of exertional dyspnea and decreased exercise capacity.  No chest discomfort or palpitations.  No dizziness.   Past Medical History:  Diagnosis Date   Dysrhythmia    AFIB   GERD (gastroesophageal reflux disease)    Hepatitis C    Rx interferon/ribaviron 2004-'05 (recurred after Rx), treated and cleared with Harvoni 2016   Hypertension     Past Surgical History:  Procedure Laterality Date   APPENDECTOMY     CARDIOVERSION N/A 02/07/2019   Procedure: CARDIOVERSION;  Surgeon: [name], MD;  Location: [facility] OR[facility];  Service: Cardiovascular;  Laterality: N/A;   COLONOSCOPY  09/2010   1 polyp-tubular adenoma   COLONOSCOPY N/A 05/02/2016   Procedure: COLONOSCOPY;  Surgeon: [name],  MD;  Location: [facility] END[facility];  Service: Endoscopy;  Laterality: N/A;   COLONOSCOPY WITH PROPOFOL N/A 07/31/2021   Procedure: COLONOSCOPY WITH PROPOFOL;  Surgeon: [name], MD;  Location: [facility] END[facility];  Service: Endoscopy;  Laterality: N/A;   LIVER BIOPSY     LUMBAR LAMINECTOMY     MENISCUS REPAIR Left 4/14     Current Outpatient Medications  Medication Sig Dispense Refill   carvedilol (COREG) 6.25 MG tablet TAKE 1 TABLET BY MOUTH TWICE DAILY *STOPMETOPROLOL* 180 tablet 3   diphenhydramine-acetaminophen (TYLENOL PM) 25-500 MG TABS tablet Take 1 tablet by mouth at bedtime as needed (sleep).     ELIQUIS 5 MG TABS tablet TAKE ONE TABLET TWICE DAILY 180 tablet 3   Melatonin 10 MG TABS Take 10 mg by mouth at bedtime.     triamterene-hydrochlorothiazide (MAXZIDE-25) 37.5-25 MG tablet TAKE ONE TABLET BY MOUTH EVERY DAY 90 tablet 3   No current facility-administered medications for this visit.    Allergies:   Lisinopril    Social History:  The patient  reports that he has been smoking cigarettes. He has been exposed to tobacco smoke. He has never used smokeless tobacco. He reports current alcohol use of about 1.0 standard drink of alcohol per week. He reports that he does not use drugs.   Family History:  The patient's family history includes Breast cancer in his sister; Colon cancer in his maternal aunt; Diabetes in his mother; Heart disease in his  father.    ROS:  Please see the history of present illness.   Otherwise, review of systems are positive for none.   All other systems are reviewed and negative.    PHYSICAL EXAM: VS:  BP (!) 148/90 (BP Location: Left Arm, Patient Position: Sitting, Cuff Size: Normal)   Pulse 69   Ht 6\' 2"  (1.88 m)   Wt 193 lb 4 oz (87.7 kg)   SpO2 98%   BMI 24.81 kg/m  , BMI Body mass index is 24.81 kg/m. GEN: Well nourished, well developed, in no acute distress  HEENT: normal  Neck: no JVD, carotid bruits, or masses Cardiac: Irregularly  irregular; no murmurs, rubs, or gallops,no edema  Respiratory:  clear to auscultation bilaterally, normal work of breathing GI: soft, nontender, nondistended, + BS MS: no deformity or atrophy  Skin: warm and dry, no rash Neuro:  Strength and sensation are intact Psych: euthymic mood, full affect   EKG:  EKG is ordered today. The ekg ordered today demonstrates: Atrial fibrillation  Nonspecific ST and T wave abnormality    Recent Labs: No results found for requested labs within last 365 days.    Lipid Panel    Component Value Date/Time   CHOL 182 12/17/2018 1400   TRIG 196.0 (H) 12/17/2018 1400   HDL 36.00 (L) 12/17/2018 1400   CHOLHDL 5 12/17/2018 1400   VLDL 39.2 12/17/2018 1400   LDLCALC 106 (H) 12/17/2018 1400      Wt Readings from Last 3 Encounters:  03/05/23 193 lb 4 oz (87.7 kg)  01/26/23 191 lb (86.6 kg)  07/28/22 188 lb (85.3 kg)         12/31/2018    3:45 PM  PAD Screen  Previous PAD dx? No  Previous surgical procedure? No  Pain with walking? No  Feet/toe relief with dangling? No  Painful, non-healing ulcers? No  Extremities discolored? No      ASSESSMENT AND PLAN:  1.  Persistent atrial fibrillation: He now has recurrent atrial fibrillation with controlled ventricular rate.  It appears that his atrial fibrillation is usually associated with cardiomyopathy and heart failure symptoms.  He has not missed any dose of Eliquis 5 mg twice daily.  I recommend proceeding with cardioversion next week.  I discussed the procedure in details as well as risk and benefits. Even though his episodes of atrial fibrillation are not frequent, they are usually associated with heart failure and thus I am referring him to Dr. [name] to discuss A-fib ablation.  2.  Chronic systolic heart failure with mildly reduced LV systolic function: No evidence of volume overload.  EF of 40 to 45% with moderate mitral regurgitation.  We are assuming that the etiology of his  cardiomyopathy is atrial fibrillation.  However, we need to evaluate ischemic etiology as well.  I recommend cardiac CTA after we get him back into sinus rhythm. Continue carvedilol.  I just changed triamterene hydrochlorothiazide to Entresto twice daily.  3.  Essential hypertension: Blood pressure is elevated.  Hydrochlorothiazide was switched to [facility] today which will be uptitrated as needed.  4.  History of liver cirrhosis due to previous hepatitis C: The patient has been cured from hepatitis C and his cirrhosis seems to be very mild overall.    5.  Mildly dilated ascending aorta: This will be evaluated with the upcoming cardiac CTA.  This will likely require annual surveillance.    Disposition:   FU with APP in 1 month.  Signed,  [name],  MD  03/05/2023 10:47 AM    [facility]

## 2023-03-05 NOTE — Patient Instructions (Signed)
Medication Instructions:  STOP the Triamterene-Hydrochlorothiazide  START Entresto 24-26 mg twice daily  *If you need a refill on your cardiac medications before your next appointment, please call your pharmacy*   Lab Work: Your provider would like for you to have following labs drawn today BMET and CBC .   If you have labs (blood work) drawn today and your tests are completely normal, you will receive your results only by: MyChart Message (if you have MyChart) OR A paper copy in the mail If you have any lab test that is abnormal or we need to change your treatment, we will call you to review the results.   Follow-Up: At [facility], you and your health needs are our priority.  As part of our continuing mission to provide you with exceptional heart care, we have created designated Provider Care Teams.  These Care Teams include your primary Cardiologist (physician) and Advanced Practice Providers (APPs -  Physician Assistants and Nurse Practitioners) who all work together to provide you with the care you need, when you need it.  We recommend signing up for the patient portal called "MyChart".  Sign up information is provided on this After Visit Summary.  MyChart is used to connect with patients for Virtual Visits (Telemedicine).  Patients are able to view lab/test results, encounter notes, upcoming appointments, etc.  Non-urgent messages can be sent to your provider as well.   To learn more about what you can do with MyChart, go to [email or web address].    Your next appointment:   1 month(s)  Provider:   You will see one of the following Advanced Practice Providers on your designated Care Team:   Nicolasa Ducking, NP Eula Listen, PA-C Cadence Fransico Rip, PA-C Charlsie Quest, NP  A referral has been placed to Electrophysiology  Other Instructions     Dear ALVOID COYT  You are scheduled for a Cardioversion on Tuesday, September 17 with Dr. Mariah Milling.  Please arrive at the  [facility] Entrance of [facility], [address], [place] [address] at 6:30 AM (This is 1 hour(s) prior to your procedure time).  Proceed to the Check-In Desk directly inside the entrance.  Procedure Parking: Use the entrance off of the [address] side of the hospital. Turn right upon entering and follow the driveway to parking that is directly in front of the Heart & Vascular Center. There is no valet parking available at this entrance, however there is an awning directly in front of the Heart & Vascular Center for drop off/ pick up for patients.    DIET:  Nothing to eat or drink after midnight except a sip of water with medications (see medication instructions below)  MEDICATION INSTRUCTIONS: !!IF ANY NEW MEDICATIONS ARE STARTED AFTER TODAY, PLEASE NOTIFY YOUR PROVIDER AS SOON AS POSSIBLE!!  FYI: Medications such as Semaglutide (Ozempic, [place]), Tirzepatide (Mounjaro, Zepbound), Dulaglutide (Trulicity), etc ("GLP1 agonists") AND Canagliflozin (Invokana), Dapagliflozin (Farxiga), Empagliflozin (Jardiance), Ertugliflozin (Steglatro), Bexagliflozin [employer]) or any combination with one of these drugs such as Invokamet (Canagliflozin/Metformin), Synjardy (Empagliflozin/Metformin), etc ("SGLT2 inhibitors") must be held around the time of a procedure. This is not a comprehensive list of all of these drugs. Please review all of your medications and talk to your provider if you take any one of these. If you are not sure, ask your provider.    Continue taking your anticoagulant (blood thinner): Apixaban (Eliquis).  You will need to continue this after your procedure until you are told by your  provider that it is safe to stop.    LABS: Labs completed 03/05/2023  FYI:  For your safety, and to allow [place] to monitor your vital signs accurately during the surgery/procedure we request: If you have artificial nails, gel coating, SNS etc, please have those removed prior to your  surgery/procedure. Not having the nail coverings /polish removed may result in cancellation or delay of your surgery/procedure.  You must have a responsible person to drive you home and stay in the waiting area during your procedure. Failure to do so could result in cancellation.  Bring your insurance cards.  *Special Note: Every effort is made to have your procedure done on time. Occasionally there are emergencies that occur at the hospital that may cause delays. Please be patient if a delay does occur.       Your cardiac CT will be scheduled at one of the below locations:   [facility] [address] [place], [place] [address] [phone]  OR  [facility] [address] [place], [place] [address] [phone]  OR   [facility] [address] [place], [place] [address] [phone]  If scheduled at [facility], please arrive at the [facility] Children[facility] Entrance (Entrance C2) of [facility] 30 minutes prior to test start time. You can use the FREE valet parking offered at entrance C (encouraged to control the heart rate for the test)  Proceed to the [facility] Radiology Department (first floor) to check-in and test prep.  All radiology patients and guests should use entrance C2 at [facility], accessed from [facility], even though the hospital's physical address listed is [address].    If scheduled at [facility] or [facility], please arrive 15 mins early for check-in and test prep.  There is spacious parking and easy access to the radiology department from the [facility] entrance. Please enter here and check-in with the desk attendant.   Please follow these instructions carefully (unless otherwise directed):  An IV will be required for this test and Nitroglycerin will be  given.  Hold all erectile dysfunction medications at least 3 days (72 hrs) prior to test. (Ie viagra, cialis, sildenafil, tadalafil, etc)   On the Night Before the Test: Be sure to Drink plenty of water. Do not consume any caffeinated/decaffeinated beverages or chocolate 12 hours prior to your test. Do not take any antihistamines 12 hours prior to your test.  On the Day of the Test: Drink plenty of water until 1 hour prior to the test. Do not eat any food 1 hour prior to test. You may take your regular medications prior to the test.  Take metoprolol (Lopressor) two hours prior to test. We will call you with the dosage of the Metoprolol        After the Test: Drink plenty of water. After receiving IV contrast, you may experience a mild flushed feeling. This is normal. On occasion, you may experience a mild rash up to 24 hours after the test. This is not dangerous. If this occurs, you can take Benadryl 25 mg and increase your fluid intake. If you experience trouble breathing, this can be serious. If it is severe call 911 IMMEDIATELY. If it is mild, please call our office. If you take any of these medications: Glipizide/Metformin, Avandament, Glucavance, please do not take 48 hours after completing test unless otherwise  instructed.  We will call to schedule your test 2-4 weeks out understanding that some insurance companies will need an authorization prior to the service being performed.   For more information and frequently asked questions, please visit our website : [email or web address]  For non-scheduling related questions, please contact the cardiac imaging nurse navigator should you have any questions/concerns: Cardiac Imaging Nurse Navigators Direct Office Dial: [phone]   For scheduling needs, including cancellations and rescheduling, please call [place], [phone].

## 2023-03-05 NOTE — Progress Notes (Signed)
Cardiology Office Note   Date:  03/05/2023   ID:  Craig Calhoun, Craig Calhoun 1950-11-14, MRN [id number]  PCP:  [name], MD  Cardiologist:   [name], MD   Chief Complaint  Patient presents with   Follow-up    F/u echo results and possible cardioversion c/o sob w/exertion. Meds reviewed verbally with pt.      History of Present Illness: Craig Calhoun is a 72 y.o. male who is here today regarding persistent atrial fibrillation.    He has known history of hepatitis C which was treated, essential hypertension and tobacco use.  He quit smoking in 2020 but he continues to chew tobacco.  The patient underwent successful cardioversion in August of 2020 and has been in sinus rhythm since then.  He was found to have incidental COVID-19 positive without symptoms before then. Echocardiogram before cardioversion showed an EF of 40 to 45%.  A repeat echo post cardioversion showed improvement in EF to 50 to 55%.    He was seen by [name] recently and was noted to have irregular rhythm.  I scheduled him for an echocardiogram which showed an EF of 40 to 45%, moderate mitral regurgitation and mildly dilated ascending aorta. Over the last few weeks, he experienced significant worsening of exertional dyspnea and decreased exercise capacity.  No chest discomfort or palpitations.  No dizziness.   Past Medical History:  Diagnosis Date   Dysrhythmia    AFIB   GERD (gastroesophageal reflux disease)    Hepatitis C    Rx interferon/ribaviron 2004-'05 (recurred after Rx), treated and cleared with Harvoni 2016   Hypertension     Past Surgical History:  Procedure Laterality Date   APPENDECTOMY     CARDIOVERSION N/A 02/07/2019   Procedure: CARDIOVERSION;  Surgeon: [name], MD;  Location: [facility] ORS;  Service: Cardiovascular;  Laterality: N/A;   COLONOSCOPY  09/2010   1 polyp-tubular adenoma   COLONOSCOPY N/A 05/02/2016   Procedure: COLONOSCOPY;  Surgeon: [name],  MD;  Location: [facility] ENDOSCOPY;  Service: Endoscopy;  Laterality: N/A;   COLONOSCOPY WITH PROPOFOL N/A 07/31/2021   Procedure: COLONOSCOPY WITH PROPOFOL;  Surgeon: [name], MD;  Location: [facility] ENDOSCOPY;  Service: Endoscopy;  Laterality: N/A;   LIVER BIOPSY     LUMBAR LAMINECTOMY     MENISCUS REPAIR Left 4/14     Current Outpatient Medications  Medication Sig Dispense Refill   carvedilol (COREG) 6.25 MG tablet TAKE 1 TABLET BY MOUTH TWICE DAILY *STOPMETOPROLOL* 180 tablet 3   diphenhydramine-acetaminophen (TYLENOL PM) 25-500 MG TABS tablet Take 1 tablet by mouth at bedtime as needed (sleep).     ELIQUIS 5 MG TABS tablet TAKE ONE TABLET TWICE DAILY 180 tablet 3   Melatonin 10 MG TABS Take 10 mg by mouth at bedtime.     triamterene-hydrochlorothiazide (MAXZIDE-25) 37.5-25 MG tablet TAKE ONE TABLET BY MOUTH EVERY DAY 90 tablet 3   No current facility-administered medications for this visit.    Allergies:   Lisinopril    Social History:  The patient  reports that he has been smoking cigarettes. He has been exposed to tobacco smoke. He has never used smokeless tobacco. He reports current alcohol use of about 1.0 standard drink of alcohol per week. He reports that he does not use drugs.   Family History:  The patient's family history includes Breast cancer in his sister; Colon cancer in his maternal aunt; Diabetes in his mother; Heart disease in his  father.    ROS:  Please see the history of present illness.   Otherwise, review of systems are positive for none.   All other systems are reviewed and negative.    PHYSICAL EXAM: VS:  BP (!) 148/90 (BP Location: Left Arm, Patient Position: Sitting, Cuff Size: Normal)   Pulse 69   Ht 6\' 2"  (1.88 m)   Wt 193 lb 4 oz (87.7 kg)   SpO2 98%   BMI 24.81 kg/m  , BMI Body mass index is 24.81 kg/m. GEN: Well nourished, well developed, in no acute distress  HEENT: normal  Neck: no JVD, carotid bruits, or masses Cardiac: Irregularly  irregular; no murmurs, rubs, or gallops,no edema  Respiratory:  clear to auscultation bilaterally, normal work of breathing GI: soft, nontender, nondistended, + BS MS: no deformity or atrophy  Skin: warm and dry, no rash Neuro:  Strength and sensation are intact Psych: euthymic mood, full affect   EKG:  EKG is ordered today. The ekg ordered today demonstrates: Atrial fibrillation  Nonspecific ST and T wave abnormality    Recent Labs: No results found for requested labs within last 365 days.    Lipid Panel    Component Value Date/Time   CHOL 182 12/17/2018 1400   TRIG 196.0 (H) 12/17/2018 1400   HDL 36.00 (L) 12/17/2018 1400   CHOLHDL 5 12/17/2018 1400   VLDL 39.2 12/17/2018 1400   LDLCALC 106 (H) 12/17/2018 1400      Wt Readings from Last 3 Encounters:  03/05/23 193 lb 4 oz (87.7 kg)  01/26/23 191 lb (86.6 kg)  07/28/22 188 lb (85.3 kg)         12/31/2018    3:45 PM  PAD Screen  Previous PAD dx? No  Previous surgical procedure? No  Pain with walking? No  Feet/toe relief with dangling? No  Painful, non-healing ulcers? No  Extremities discolored? No      ASSESSMENT AND PLAN:  1.  Persistent atrial fibrillation: He now has recurrent atrial fibrillation with controlled ventricular rate.  It appears that his atrial fibrillation is usually associated with cardiomyopathy and heart failure symptoms.  He has not missed any dose of Eliquis 5 mg twice daily.  I recommend proceeding with cardioversion next week.  I discussed the procedure in details as well as risk and benefits. Even though his episodes of atrial fibrillation are not frequent, they are usually associated with heart failure and thus I am referring him to Dr. [name] to discuss A-fib ablation.  2.  Chronic systolic heart failure with mildly reduced LV systolic function: No evidence of volume overload.  EF of 40 to 45% with moderate mitral regurgitation.  We are assuming that the etiology of his  cardiomyopathy is atrial fibrillation.  However, we need to evaluate ischemic etiology as well.  I recommend cardiac CTA after we get him back into sinus rhythm. Continue carvedilol.  I just changed triamterene hydrochlorothiazide to Entresto twice daily.  3.  Essential hypertension: Blood pressure is elevated.  Hydrochlorothiazide was switched to [facility] today which will be uptitrated as needed.  4.  History of liver cirrhosis due to previous hepatitis C: The patient has been cured from hepatitis C and his cirrhosis seems to be very mild overall.    5.  Mildly dilated ascending aorta: This will be evaluated with the upcoming cardiac CTA.  This will likely require annual surveillance.    Disposition:   FU with APP in 1 month.  Signed,  [name],  MD  03/05/2023 10:47 AM    [facility]

## 2023-03-06 ENCOUNTER — Telehealth: Payer: Self-pay | Admitting: Cardiovascular Disease

## 2023-03-06 ENCOUNTER — Other Ambulatory Visit: Payer: Self-pay | Admitting: Cardiovascular Disease

## 2023-03-06 DIAGNOSIS — I4819 Other persistent atrial fibrillation: Secondary | ICD-10-CM

## 2023-03-06 LAB — BASIC METABOLIC PANEL
BUN/Creatinine Ratio: 22 (ref 10–24)
BUN: 21 mg/dL (ref 8–27)
CO2: 26 mmol/L (ref 20–29)
Calcium: 9.8 mg/dL (ref 8.6–10.2)
Chloride: 100 mmol/L (ref 96–106)
Creatinine, Ser: 0.94 mg/dL (ref 0.76–1.27)
Glucose: 93 mg/dL (ref 70–99)
Potassium: 4.5 mmol/L (ref 3.5–5.2)
Sodium: 142 mmol/L (ref 134–144)
eGFR: 86 mL/min/{1.73_m2} (ref >59)

## 2023-03-06 LAB — CBC
Hematocrit: 47.8 % (ref 37.5–51.0)
Hemoglobin: 15.5 g/dL (ref 13.0–17.7)
MCH: 31.2 pg (ref 26.6–33.0)
MCHC: 32.4 g/dL (ref 31.5–35.7)
MCV: 96 fL (ref 79–97)
Platelets: 232 10*3/uL (ref 150–450)
RBC: 4.97 x10E6/uL (ref 4.14–5.80)
RDW: 13.1 % (ref 11.6–15.4)
WBC: 7.8 10*3/uL (ref 3.4–10.8)

## 2023-03-06 NOTE — Telephone Encounter (Signed)
Pt c/o medication issue:  1. Name of Medication:  sacubitril-valsartan (ENTRESTO) 24-26 MG carvedilol (COREG) 6.25 MG tablet  2. How are you currently taking this medication (dosage and times per day)?   3. Are you having a reaction (difficulty breathing--STAT)?   4. What is your medication issue?    Patient states he has been informed that these medication are in the same class and indicate a possible duplicate drug therapy. Please advise.

## 2023-03-09 ENCOUNTER — Telehealth: Payer: Self-pay | Admitting: *Deleted

## 2023-03-09 NOTE — Telephone Encounter (Signed)
Spoke with patient and reviewed time and procedure scheduled for tomorrow. He verbalized understanding of all information with no further questions at this time.

## 2023-03-10 ENCOUNTER — Ambulatory Visit: Payer: PPO | Admitting: Anesthesiology

## 2023-03-10 ENCOUNTER — Other Ambulatory Visit: Payer: Self-pay

## 2023-03-10 ENCOUNTER — Encounter: Payer: Self-pay | Admitting: Cardiovascular Disease

## 2023-03-10 ENCOUNTER — Encounter
Admission: RE | Disposition: A | Discharge status: Home / Self Care | Payer: Self-pay | Source: Home / Self Care | Attending: Cardiovascular Disease

## 2023-03-10 ENCOUNTER — Ambulatory Visit
Admission: RE | Admit: 2023-03-10 | Discharge: 2023-03-10 | Disposition: A | Discharge status: Home / Self Care | Payer: PPO | Attending: Cardiovascular Disease | Admitting: Cardiovascular Disease

## 2023-03-10 DIAGNOSIS — I4819 Other persistent atrial fibrillation: Secondary | ICD-10-CM | POA: Diagnosis not present

## 2023-03-10 DIAGNOSIS — Z8249 Family history of ischemic heart disease and other diseases of the circulatory system: Secondary | ICD-10-CM | POA: Diagnosis not present

## 2023-03-10 DIAGNOSIS — I5022 Chronic systolic (congestive) heart failure: Secondary | ICD-10-CM | POA: Insufficient documentation

## 2023-03-10 DIAGNOSIS — Z8619 Personal history of other infectious and parasitic diseases: Secondary | ICD-10-CM | POA: Diagnosis not present

## 2023-03-10 DIAGNOSIS — R0602 Shortness of breath: Secondary | ICD-10-CM

## 2023-03-10 DIAGNOSIS — K219 Gastro-esophageal reflux disease without esophagitis: Secondary | ICD-10-CM | POA: Insufficient documentation

## 2023-03-10 DIAGNOSIS — F17220 Nicotine dependence, chewing tobacco, uncomplicated: Secondary | ICD-10-CM | POA: Insufficient documentation

## 2023-03-10 DIAGNOSIS — I10 Essential (primary) hypertension: Secondary | ICD-10-CM | POA: Diagnosis not present

## 2023-03-10 DIAGNOSIS — I4891 Unspecified atrial fibrillation: Secondary | ICD-10-CM | POA: Diagnosis not present

## 2023-03-10 DIAGNOSIS — Z7901 Long term (current) use of anticoagulants: Secondary | ICD-10-CM | POA: Diagnosis not present

## 2023-03-10 DIAGNOSIS — F17200 Nicotine dependence, unspecified, uncomplicated: Secondary | ICD-10-CM | POA: Diagnosis not present

## 2023-03-10 DIAGNOSIS — I110 Hypertensive heart disease with heart failure: Secondary | ICD-10-CM | POA: Insufficient documentation

## 2023-03-10 HISTORY — PX: CARDIOVERSION: SHX1299

## 2023-03-10 SURGERY — CARDIOVERSION
Anesthesia: General

## 2023-03-10 MED ORDER — PROPOFOL 10 MG/ML IV BOLUS
INTRAVENOUS | Status: DC | PRN
  Administered 2023-03-10: 120 mg via INTRAVENOUS

## 2023-03-10 MED ORDER — PROPOFOL 10 MG/ML IV BOLUS
INTRAVENOUS | Status: AC
  Filled 2023-03-10: qty 40

## 2023-03-10 MED ORDER — SODIUM CHLORIDE 0.9 % IV SOLN: INTRAVENOUS | Status: DC

## 2023-03-10 NOTE — Anesthesia Preprocedure Evaluation (Signed)
Anesthesia Evaluation  Patient identified by MRN, date of birth, ID band Patient awake    Reviewed: Allergy & Precautions, NPO status , Patient's Chart, lab work & pertinent test results  History of Anesthesia Complications Negative for: history of anesthetic complications  Airway Mallampati: II  TM Distance: >3 FB Neck ROM: full    Dental  (+) Chipped   Pulmonary neg shortness of breath, COPD, neg recent URI, Patient abstained from smoking.Not current smoker, former smoker    + decreased breath sounds      Cardiovascular Exercise Tolerance: Good hypertension, Pt. on medications (-) angina (-) Past MI + dysrhythmias Atrial Fibrillation (-) Valvular Problems/Murmurs Rhythm:Irregular Rate:Normal     Neuro/Psych negative neurological ROS  negative psych ROS   GI/Hepatic ,GERD  Medicated,,(+) Cirrhosis         Endo/Other  negative endocrine ROS    Renal/GU Renal disease (kidney stones)     Musculoskeletal   Abdominal Normal abdominal exam  (+)   Peds negative pediatric ROS (+)  Hematology negative hematology ROS (+)   Anesthesia Other Findings Past Medical History: No date: GERD (gastroesophageal reflux disease) No date: Hepatitis C     Comment:  Rx interferon/ribaviron 2004-'05 (recurred after Rx),               treated and cleared with Harvoni 2016 No date: Hypertension   Reproductive/Obstetrics                             Anesthesia Physical Anesthesia Plan  ASA: 3  Anesthesia Plan: General   Post-op Pain Management: Minimal or no pain anticipated   Induction: Intravenous  PONV Risk Score and Plan: 2 and Propofol infusion, TIVA and Ondansetron  Airway Management Planned: Nasal Cannula  Additional Equipment: None  Intra-op Plan:   Post-operative Plan:   Informed Consent: I have reviewed the patients History and Physical, chart, labs and discussed the procedure  including the risks, benefits and alternatives for the proposed anesthesia with the patient or authorized representative who has indicated his/her understanding and acceptance.     Dental advisory given  Plan Discussed with: CRNA and Surgeon  Anesthesia Plan Comments: (Discussed risks of anesthesia with patient, including possibility of difficulty with spontaneous ventilation under anesthesia necessitating airway intervention, PONV, and rare risks such as cardiac or respiratory or neurological events, and allergic reactions. Discussed the role of CRNA in patient's perioperative care. Patient understands.)        Anesthesia Quick Evaluation

## 2023-03-10 NOTE — Interval H&P Note (Signed)
History and Physical Interval Note:  03/10/2023 7:57 AM  Craig Calhoun  has presented today for surgery, with the diagnosis of Cardioversion  Afib.  The various methods of treatment have been discussed with the patient and family. After consideration of risks, benefits and other options for treatment, the patient has consented to  Procedure(s): CARDIOVERSION (N/A) as a surgical intervention.  The patient's history has been reviewed, patient examined, no change in status, stable for surgery.  I have reviewed the patient's chart and labs.  Questions were answered to the patient's satisfaction.     [name]

## 2023-03-10 NOTE — Anesthesia Postprocedure Evaluation (Signed)
Anesthesia Post Note  Patient: Craig Calhoun  Procedure(s) Performed: CARDIOVERSION  Patient location during evaluation: Specials Recovery Anesthesia Type: General Level of consciousness: awake and alert Pain management: pain level controlled Vital Signs Assessment: post-procedure vital signs reviewed and stable Respiratory status: spontaneous breathing, nonlabored ventilation, respiratory function stable and patient connected to nasal cannula oxygen Cardiovascular status: blood pressure returned to baseline and stable Postop Assessment: no apparent nausea or vomiting Anesthetic complications: no   No notable events documented.   Last Vitals:  Vitals:   03/10/23 0800 03/10/23 0815  BP: 110/76 126/71  Pulse: (!) 59 (!) 55  Resp: 16 14  Temp:    SpO2: 96% 95%    Last Pain:  Vitals:   03/10/23 0815  TempSrc:   PainSc: 0-No pain                 Corinda Gubler

## 2023-03-10 NOTE — Transfer of Care (Signed)
Immediate Anesthesia Transfer of Care Note  Patient: Craig Calhoun  Procedure(s) Performed: CARDIOVERSION  Patient Location: PACU  Anesthesia Type:General  Level of Consciousness: awake, alert , and oriented  Airway & Oxygen Therapy: Patient Spontanous Breathing and Patient connected to nasal cannula oxygen  Post-op Assessment: Report given to RN and Post -op Vital signs reviewed and stable  Post vital signs: Reviewed and stable  Last Vitals:  Vitals Value Taken Time  BP 114/68 03/10/23 0749  Temp    Pulse 70 03/10/23 0749  Resp 15 03/10/23 0749  SpO2 99 % 03/10/23 0749  Vitals shown include unfiled device data.  Last Pain:  Vitals:   03/10/23 0708  TempSrc: Oral  PainSc: 0-No pain         Complications: No notable events documented.

## 2023-03-10 NOTE — CV Procedure (Signed)
Cardioversion procedure note For atrial fibrillation, persistent.  Procedure Details:  Consent: Risks of procedure as well as the alternatives and risks of each were explained to the (patient/caregiver).  Consent for procedure obtained.  Time Out: Verified patient identification, verified procedure, site/side was marked, verified correct patient position, special equipment/implants available, medications/allergies/relevent history reviewed, required imaging and test results available.  Performed  Patient placed on cardiac monitor, pulse oximetry, supplemental oxygen as necessary.   Sedation given: propofol IV, Dr. Suzan Slick Pacer pads placed anterior and posterior chest.   Cardioverted 1 time(s).   Cardioverted at  150 J. Synchronized biphasic Converted to NSR   Evaluation: Findings: Post procedure EKG shows: NSR Complications: None Patient did tolerate procedure well.  Time Spent Directly with the Patient:  45 minutes   Craig Calhoun, M.D., Ph.D.

## 2023-03-10 NOTE — H&P (Signed)
H&P Addendum, pre-cardioversion  Patient was seen and evaluated prior to -cardioversion procedure Symptoms, prior testing details again confirmed with the patient Patient examined, no significant change from prior exam Lab work reviewed in detail personally by myself Patient understands risk and benefit of the procedure,  The risks (stroke, cardiac arrhythmias rarely resulting in the need for a temporary or permanent pacemaker, skin irritation or burns and complications associated with conscious sedation including aspiration, arrhythmia, respiratory failure and death), benefits (restoration of normal sinus rhythm) and alternatives of a direct current cardioversion were explained in detail Patient willing to proceed.  Signed, Dossie Arbour, MD, Ph.D [facility]

## 2023-03-10 NOTE — Telephone Encounter (Signed)
Left a message for the patient to call back. The patient can take the carvedilol and entresto together as they are not duplicate therapy.

## 2023-03-11 ENCOUNTER — Encounter: Payer: Self-pay | Admitting: Cardiovascular Disease

## 2023-03-11 NOTE — Telephone Encounter (Signed)
Left a message for the patient to call back.

## 2023-03-12 NOTE — Telephone Encounter (Signed)
Left a message for the patient that he may take both medications.

## 2023-03-16 ENCOUNTER — Encounter: Payer: Self-pay | Admitting: Cardiovascular Disease

## 2023-03-16 ENCOUNTER — Telehealth: Payer: Self-pay | Admitting: Cardiovascular Disease

## 2023-03-16 NOTE — Telephone Encounter (Signed)
Left voicemail message to call back.  

## 2023-03-16 NOTE — Telephone Encounter (Signed)
Pt returning nurse's call. Please advise

## 2023-03-16 NOTE — Telephone Encounter (Signed)
Pt c/o Shortness Of Breath: STAT if SOB developed within the last 24 hours or pt is noticeably SOB on the phone  1. Are you currently SOB (can you hear that pt is SOB on the phone)? no  2. How long have you been experiencing SOB? Since the surgery (9/17)  3. Are you SOB when sitting or when up moving around? Moving around   4. Are you currently experiencing any other symptoms?

## 2023-03-16 NOTE — Telephone Encounter (Signed)
Left voicemail message to call back

## 2023-03-19 ENCOUNTER — Telehealth: Payer: Self-pay | Admitting: *Deleted

## 2023-03-19 NOTE — Telephone Encounter (Signed)
See separate note.

## 2023-03-19 NOTE — Telephone Encounter (Signed)
Called the patient to discuss taking Metoprolol prior to the CT.  Per Dr. Kirke Corin, the patient should take Metoprolol Tartrate 50 mg two hours prior to the Cardiac Ct.  The patient stated that he has been having shortness of breath post cardioversion. The shortness of breath is on exertion and not at rest. He denies chest pain and edema.   The patient stated that his heart rate has been in the 40's. He has been advised to keep a log of his blood pressure and heart rates 1-2 hours after taking his medication.

## 2023-03-20 ENCOUNTER — Encounter: Payer: Self-pay | Admitting: Nurse Practitioner

## 2023-03-20 ENCOUNTER — Ambulatory Visit: Payer: PPO | Attending: Nurse Practitioner | Admitting: Nurse Practitioner

## 2023-03-20 VITALS — BP 168/84 | HR 58 | Ht 74.0 in | Wt 193.2 lb

## 2023-03-20 DIAGNOSIS — I429 Cardiomyopathy, unspecified: Secondary | ICD-10-CM | POA: Diagnosis not present

## 2023-03-20 DIAGNOSIS — I77810 Thoracic aortic ectasia: Secondary | ICD-10-CM

## 2023-03-20 DIAGNOSIS — I4819 Other persistent atrial fibrillation: Secondary | ICD-10-CM

## 2023-03-20 DIAGNOSIS — I5022 Chronic systolic (congestive) heart failure: Secondary | ICD-10-CM | POA: Diagnosis not present

## 2023-03-20 DIAGNOSIS — I10 Essential (primary) hypertension: Secondary | ICD-10-CM | POA: Diagnosis not present

## 2023-03-20 MED ORDER — SACUBITRIL-VALSARTAN 49-51 MG PO TABS: 1.0000 | ORAL_TABLET | Freq: Two times a day (BID) | ORAL | 3 refills | Status: DC

## 2023-03-20 MED ORDER — CARVEDILOL 3.125 MG PO TABS: 3.1250 mg | ORAL_TABLET | Freq: Two times a day (BID) | ORAL | 3 refills | Status: DC

## 2023-03-20 NOTE — Telephone Encounter (Signed)
Appointment made today for the patient.

## 2023-03-20 NOTE — Progress Notes (Signed)
Office Visit    Patient Name: Craig Calhoun Date of Encounter: 03/20/2023  Primary Care Provider:  [name], MD Primary Cardiologist:  [name], MD  Chief Complaint    72 y.o. male w/a  h/o persistent Afib, HTN, dilated Asc Ao/root, GERD, hepatitis C, and tob abuse, who presents for f/u after recent cardioversion.  Past Medical History    Past Medical History:  Diagnosis Date   Acquired dilation of ascending aorta and aortic root (HCC)    a. 02/2023 Echo: Asc Ao 42mm, Ao root 37mm.   Cardiomyopathy (HCC)    a. Presumed to be 2/2 afib-->01/2019 Echo: EF 40-50%; b. 02/2019 Echo: EF 50-55% (sinus rhythm); c. 02/2023 Echo: EF 40-45%, glob HK, low-nl RV fxn, mildly dil LA, mod MR, Asc Ao 42mm, Ao root 37mm.   GERD (gastroesophageal reflux disease)    Hepatitis C    Rx interferon/ribaviron 2004-'05 (recurred after Rx), treated and cleared with Harvoni 2016   Hypertension    Persistent atrial fibrillation (HCC)    a. 01/2019 s/p DCCV (200J); b. 02/2023 s/p DCCV (150J - biphasic).   Past Surgical History:  Procedure Laterality Date   APPENDECTOMY     CARDIOVERSION N/A 02/07/2019   Procedure: CARDIOVERSION;  Surgeon: [name], MD;  Location: [facility] OR[facility];  Service: Cardiovascular;  Laterality: N/A;   CARDIOVERSION N/A 03/10/2023   Procedure: CARDIOVERSION;  Surgeon: Antonieta Iba, MD;  Location: [facility] OR[facility];  Service: Cardiovascular;  Laterality: N/A;   COLONOSCOPY  09/2010   1 polyp-tubular adenoma   COLONOSCOPY N/A 05/02/2016   Procedure: COLONOSCOPY;  Surgeon: [name], MD;  Location: [facility];  Service: Endoscopy;  Laterality: N/A;   COLONOSCOPY WITH PROPOFOL N/A 07/31/2021   Procedure: COLONOSCOPY WITH PROPOFOL;  Surgeon: [name], MD;  Location: [facility] ENDOSCOPY;  Service: Endoscopy;  Laterality: N/A;   LIVER BIOPSY     LUMBAR LAMINECTOMY     MENISCUS REPAIR Left 4/14    Allergies  Allergies  Allergen Reactions   Lisinopril      Uvula swelling---?angioedema    History of Present Illness      72 y.o. y/o male w/a  h/o persistent Afib, HTN, dilated Asc Ao/root, GERD, hepatitis C, and tob abuse.  He was previously diagnosed with atrial fibrillation in 2020.  Echocardiogram in the setting of manage showed EF of 40 to 45%.  He subsequently underwent cardioversion in August 2020 with restoration of sinus rhythm and improvement in EF to 50-55%.  Of note, he was incidentally found to be COVID-positive prior to his cardioversion.    Craig Calhoun has been managed with beta-blocker and Eliquis therapy.  In August 2021, he was noted to have irregular heart rhythm by his primary care provider, and complained of progressive dyspnea on exertion.  Given high suspicion for recurrent atrial fibrillation, he was set up to see Dr. Kirke Corin and prior to that visit, an echocardiogram was performed, and showed recurrent LV dysfunction with an EF of 40 to 45% with global hypokinesis and low normal RV function.  The left atrium is mildly dilated.  Moderate MR was noted, along with a 42 mm ascending aorta and 37 mm aortic root.  He saw Dr. Kirke Corin on March 05, 2023 subsequently underwent successful cardioversion on March 10, 2023.   Since his cardioversion, he has been noting bradycardia on home monitoring.  Rates frequently in the 40s.  His breathing has improved slightly since his cardioversion though has not recovered  as quickly as he expected, and he has been having some fatigue.  His blood pressures are trending in the 140s to 150s.  He is currently scheduled for coronary CT angiogram next week.  He was added onto my schedule today due to bradycardia.  He is in sinus rhythm at 58 on arrival and is currently asymptomatic.  He denies chest pain, palpitations, PND, orthopnea, dizziness, syncope, edema, or early satiety.  Home Medications    Current Outpatient Medications  Medication Sig Dispense Refill   carvedilol (COREG) 6.25 MG tablet TAKE 1  TABLET BY MOUTH TWICE DAILY *STOPMETOPROLOL* 180 tablet 3   diphenhydramine-acetaminophen (TYLENOL PM) 25-500 MG TABS tablet Take 1 tablet by mouth at bedtime as needed (sleep).     ELIQUIS 5 MG TABS tablet TAKE ONE TABLET TWICE DAILY 180 tablet 3   Melatonin 10 MG TABS Take 10 mg by mouth at bedtime.     sacubitril-valsartan (ENTRESTO) 24-26 MG Take 1 tablet by mouth 2 (two) times daily. 60 tablet 1   No current facility-administered medications for this visit.     Review of Systems    Ongoing DOE and fatigue, though DOE has improved slightly.  He denies chest pain, palpitations, pnd, orthopnea, n, v, dizziness, syncope, edema, weight gain, or early satiety.  All other systems reviewed and are otherwise negative except as noted above.    Physical Exam    VS:  BP (!) 176/80 (BP Location: Left Arm, Patient Position: Sitting, Cuff Size: Normal)   Pulse (!) 58   Ht 6\' 2"  (1.88 m)   Wt 193 lb 4 oz (87.7 kg)   SpO2 95%   BMI 24.81 kg/m  , BMI Body mass index is 24.81 kg/m.     Vitals:   03/20/23 1512 03/20/23 1644  BP: (!) 176/80 (!) 168/84  Pulse: (!) 58   SpO2: 95%     GEN: Well nourished, well developed, in no acute distress. HEENT: normal. Neck: Supple, no JVD, carotid bruits, or masses. Cardiac: RRR, no murmurs, rubs, or gallops. No clubbing, cyanosis, edema.  Radials 2+/PT 2+ and equal bilaterally.  Respiratory:  Respirations regular and unlabored, diminished breath sounds in bilat bases. GI: Soft, nontender, nondistended, BS + x 4. MS: no deformity or atrophy. Skin: warm and dry, no rash. Neuro:  Strength and sensation are intact. Psych: Normal affect.  Accessory Clinical Findings    ECG personally reviewed by me today - EKG Interpretation Date/Time:  Friday March 20 2023 15:15:06 EDT Ventricular Rate:  58 PR Interval:  188 QRS Duration:  82 QT Interval:  452 QTC Calculation: 443 R Axis:   76  Text Interpretation: Sinus bradycardia with sinus arrhythmia  Confirmed by Nicolasa Ducking [phone]) on 03/20/2023 3:34:29 PM  - no acute changes.  Lab Results  Component Value Date   WBC 7.8 03/05/2023   HGB 15.5 03/05/2023   HCT 47.8 03/05/2023   MCV 96 03/05/2023   PLT 232 03/05/2023   Lab Results  Component Value Date   CREATININE 0.94 03/05/2023   BUN 21 03/05/2023   NA 142 03/05/2023   K 4.5 03/05/2023   CL 100 03/05/2023   CO2 26 03/05/2023   Lab Results  Component Value Date   ALT 16 03/05/2021   AST 22 03/05/2021   ALKPHOS 66 03/05/2021   BILITOT 0.8 03/05/2021   Lab Results  Component Value Date   CHOL 182 12/17/2018   HDL 36.00 (L) 12/17/2018   LDLCALC 106 (H) 12/17/2018  TRIG 196.0 (H) 12/17/2018   CHOLHDL 5 12/17/2018     Assessment & Plan    1.  Persistent atrial fibrillation: Status post recent cardioversion.  Since then, he has been having some fatigue and only slight improvement in dyspnea.  He is also noted bradycardia at home with rates often in the 40s.  He is maintaining sinus rhythm with a heart rate of 58 today.  I will reduce his carvedilol to 3.125 mg twice daily.  He remains anticoagulated with Eliquis and has already been referred to electrophysiology for consideration of A-fib ablation.  2.  Cardiomyopathy/chronic HFmrEF: In the setting of recurrent atrial fibrillation, recent echo showed an EF of 40 to 45% with global hypokinesis.  At that time, he was switched from metoprolol to carvedilol and Entresto also added.  As above, in sinus rhythm, carvedilol this resulted in significant bradycardia and I am reducing this to 3.125 mg twice daily.  He is euvolemic on examination.  His blood pressure is elevated and I am increasing his Entresto to 49-51 mg twice daily.  Plan for follow-up basic metabolic panel next week prior to his coronary CT angiogram, which was previously planned to rule out obstructive coronary artery disease in the setting of cardiomyopathy.  Further recommendations pending coronary CTA.  3.   Dilated ascending aorta and aortic root: Recent echo notable for dilated ascending aorta at 42 mm with a rate of 37 mm.  Plan for follow-up imaging in 1 year.  4.  Moderate mitral regurgitation: In the setting of A-fib and reduced EF on echo.  Plan for follow-up imaging in the next 2 to 3 months pending coronary CT angiogram.  5.  HTN:  BP elevated today.  Titrating entresto.  F/u bmet in 1 wk.   6.  Tobacco abuse: Quit smoking about a month ago.  Diminished breath sounds at bilateral bases and suspect long-term tobacco exposure and some degree of COPD playing a role in his dyspnea.  Congratulated him on quitting and encouraged him to remain off of cigarettes.  7.  Disposition: Follow-up basic metabolic panel next week.  Follow-up coronary CT angiogram next week.  Referral for EP has already been made.  Follow-up in clinic in approximately 4 to 6 weeks.  Nicolasa Ducking, NP 03/20/2023, 3:35 PM

## 2023-03-20 NOTE — Telephone Encounter (Signed)
Yes, we can hold off on metoprolol.

## 2023-03-20 NOTE — Patient Instructions (Addendum)
Medication Instructions:  Decrease Carvedilol 3.125 mg twice daily  Increase Entresto to 49/51 twice daily   *If you need a refill on your cardiac medications before your next appointment, please call your pharmacy*   Lab Work: Your provider would like for you to have following labs drawn next week BMET. (No lab appointment is needed)  If you have labs (blood work) drawn today and your tests are completely normal, you will receive your results only by: MyChart Message (if you have MyChart) OR A paper copy in the mail If you have any lab test that is abnormal or we need to change your treatment, we will call you to review the results.   Follow-Up: At [facility], you and your health needs are our priority.  As part of our continuing mission to provide you with exceptional heart care, we have created designated Provider Care Teams.  These Care Teams include your primary Cardiologist (physician) and Advanced Practice Providers (APPs -  Physician Assistants and Nurse Practitioners) who all work together to provide you with the care you need, when you need it.  We recommend signing up for the patient portal called "MyChart".  Sign up information is provided on this After Visit Summary.  MyChart is used to connect with patients for Virtual Visits (Telemedicine).  Patients are able to view lab/test results, encounter notes, upcoming appointments, etc.  Non-urgent messages can be sent to your provider as well.   To learn more about what you can do with MyChart, go to [email or web address].    Your next appointment:   1 month(s)  Provider:   [name], MD or Nicolasa Ducking, NP

## 2023-03-24 ENCOUNTER — Encounter (HOSPITAL_COMMUNITY): Payer: Self-pay

## 2023-03-24 DIAGNOSIS — R001 Bradycardia, unspecified: Secondary | ICD-10-CM | POA: Diagnosis not present

## 2023-03-24 DIAGNOSIS — R0602 Shortness of breath: Secondary | ICD-10-CM | POA: Diagnosis not present

## 2023-03-24 DIAGNOSIS — I5022 Chronic systolic (congestive) heart failure: Secondary | ICD-10-CM | POA: Diagnosis not present

## 2023-03-25 LAB — BASIC METABOLIC PANEL
BUN/Creatinine Ratio: 19 (ref 10–24)
BUN: 18 mg/dL (ref 8–27)
CO2: 24 mmol/L (ref 20–29)
Calcium: 9.2 mg/dL (ref 8.6–10.2)
Chloride: 104 mmol/L (ref 96–106)
Creatinine, Ser: 0.94 mg/dL (ref 0.76–1.27)
Glucose: 101 mg/dL — ABNORMAL HIGH (ref 70–99)
Potassium: 4.5 mmol/L (ref 3.5–5.2)
Sodium: 141 mmol/L (ref 134–144)
eGFR: 86 mL/min/{1.73_m2} (ref >59)

## 2023-03-26 ENCOUNTER — Ambulatory Visit
Admission: RE | Admit: 2023-03-26 | Discharge: 2023-03-26 | Disposition: A | Discharge status: Home / Self Care | Payer: PPO | Source: Ambulatory Visit | Attending: Cardiovascular Disease | Admitting: Cardiovascular Disease

## 2023-03-26 DIAGNOSIS — R072 Precordial pain: Secondary | ICD-10-CM

## 2023-03-26 MED ORDER — METOPROLOL TARTRATE 5 MG/5ML IV SOLN
10.0000 mg | Freq: Once | INTRAVENOUS | Status: AC | PRN
  Administered 2023-03-26: 10 mg via INTRAVENOUS

## 2023-03-26 MED ORDER — DILTIAZEM HCL 25 MG/5ML IV SOLN: 10.0000 mg | INTRAVENOUS | Status: DC | PRN

## 2023-03-26 MED ORDER — SODIUM CHLORIDE 0.9 % IV SOLN: INTRAVENOUS | Status: DC

## 2023-03-26 MED ORDER — IOHEXOL 300 MG/ML  SOLN
75.0000 mL | Freq: Once | INTRAMUSCULAR | Status: AC | PRN
  Administered 2023-03-26: 75 mL via INTRAVENOUS

## 2023-03-26 MED ORDER — NITROGLYCERIN 0.4 MG SL SUBL
0.8000 mg | SUBLINGUAL_TABLET | Freq: Once | SUBLINGUAL | Status: AC
  Administered 2023-03-26: 0.8 mg via SUBLINGUAL

## 2023-03-26 NOTE — Progress Notes (Signed)
Patient tolerated procedure well. Ambulate w/o difficulty. Denies light headedness or being dizzy. Drinking water provided. Encouraged to drink extra water today and reasoning explained. Verbalized understanding. All questions answered. ABC intact. No further needs. Discharge from procedure area w/o issues.

## 2023-03-31 DIAGNOSIS — H5203 Hypermetropia, bilateral: Secondary | ICD-10-CM | POA: Diagnosis not present

## 2023-03-31 DIAGNOSIS — H524 Presbyopia: Secondary | ICD-10-CM | POA: Diagnosis not present

## 2023-04-10 ENCOUNTER — Ambulatory Visit: Payer: PPO | Admitting: Medical

## 2023-04-13 ENCOUNTER — Other Ambulatory Visit: Payer: Self-pay | Admitting: Internal Medicine

## 2023-04-29 ENCOUNTER — Encounter: Payer: PPO | Admitting: Internal Medicine

## 2023-04-30 ENCOUNTER — Ambulatory Visit: Payer: PPO | Admitting: Nurse Practitioner

## 2023-05-07 ENCOUNTER — Encounter: Payer: PPO | Admitting: Internal Medicine

## 2023-05-08 ENCOUNTER — Encounter: Payer: Self-pay | Admitting: Internal Medicine

## 2023-05-14 ENCOUNTER — Ambulatory Visit: Payer: PPO | Admitting: Cardiovascular Disease

## 2023-05-19 NOTE — Progress Notes (Unsigned)
Electrophysiology Office Note:    Date:  05/20/2023   ID:  Craig Calhoun, DOB 09/11/1950, MRN [id number]  [facility] Cardiologist:  [name], MD  [facility] Electrophysiologist:  Lanier Prude, MD   Referring MD: [name], MD   Chief Complaint: Atrial fibrillation  History of Present Illness:    Mr. Craig Calhoun is a 72 year old man who I am seeing today for an evaluation of persistent atrial fibrillation.  His medical history includes persistent atrial fibrillation hypertension, dilated ascending aorta, GERD, hepatitis C (treated), tobacco use and chronic systolic heart failure.  He saw Craig Calhoun in clinic on March 20, 2023.  He had a recent cardioversion for his atrial fibrillation.  After the cardioversion his heart rates were noted to be in the 40s and 50s.  He had shortness of breath while in atrial fibrillation with some improvement back in sinus rhythm.  He is on Eliquis for stroke prophylaxis.  Discussed the use of AI scribe software for clinical note transcription with the patient, who gave verbal consent to proceed.  History of Present Illness   The patient, with a history of atrial fibrillation (AFib) and reduced heart function, presents for a discussion about potential treatment options. He is currently on Eliquis for stroke prevention and Entresto for heart function. He expresses interest in an ablation procedure, as a friend of his had undergone the procedure with successful results. The patient reports that this is the second time he's had AFib and is considering intervention. He also expresses concern about the high cost of his medication, particularly Entresto, which costs him $160 a month. The patient has a history of hepatitis C and cirrhosis which increases his risk of bleeding while on Eliquis.               Their past medical, social and family history was reveiwed.   ROS:   Please see the history of present illness.    All other  systems reviewed and are negative.  EKGs/Labs/Other Studies Reviewed:    The following studies were reviewed today:  April 18, 2023 CTA coronary Mild dilation of the ascending aorta Suspected cirrhosis 40th percentile for coronary artery calcium  March 03, 2023 echo EF 40-45 RV normal/low normal Mildly dilated left atrium       Physical Exam:    VS:  BP (!) 152/90   Pulse 63   Ht 6\' 2"  (1.88 m)   Wt 195 lb (88.5 kg)   SpO2 95%   BMI 25.04 kg/m     Wt Readings from Last 3 Encounters:  05/20/23 195 lb (88.5 kg)  03/20/23 193 lb 4 oz (87.7 kg)  03/10/23 188 lb (85.3 kg)     GEN: well appearing male, no distress CARD: RRR, no IWOB. RESP: No IWOB.       ASSESSMENT AND PLAN:    1. Persistent atrial fibrillation (HCC)   2. Other cirrhosis of liver (HCC)   3. Aortic atherosclerosis (HCC)   4. Chronic systolic heart failure (HCC)       Assessment and Plan    Atrial Fibrillation Recurrent episodes. Discussed the benefits of early intervention with ablation to prevent scar tissue formation and increase chances of maintaining sinus rhythm. Also discussed the potential for a Watchman device to provide stroke protection while potentially allowing discontinuation of anticoagulation in the future given his history of cirrhosis and HCV (treated). -Schedule for ablation procedure. -Order CT scan of the chest to assess candidacy for Watchman device. -  If CT scan shows favorable appendage anatomy, plan for concomitant ablation/watchman implant.  Reduced Ejection Fraction EF 40-45%, likely secondary to AFib. Currently on Entresto for management. Discussed potential for improvement with successful rhythm control. -Continue Entresto.  Cirrhosis Discussed increased risk of bleeding due to cirrhosis and potential benefits of Watchman device in this context.  Follow-up Post-procedure and after CT scan results are available.         Discussed treatment options  today for AF including antiarrhythmic drug therapy and ablation. Discussed risks, recovery and likelihood of success with each treatment strategy. Risk, benefits, and alternatives to EP study and ablation for afib were discussed. These risks include but are not limited to stroke, bleeding, vascular damage, tamponade, perforation, damage to the esophagus, lungs, phrenic nerve and other structures, pulmonary vein stenosis, worsening renal function, coronary vasospasm and death.  Discussed potential need for repeat ablation procedures and antiarrhythmic drugs after an initial ablation. The patient understands these risk and wishes to proceed.  We will therefore proceed with catheter ablation at the next available time.  Carto, ICE, anesthesia are requested for the procedure.  Will also obtain CT PV protocol prior to the procedure to exclude LAA thrombus and further evaluate atrial anatomy.  -------------  I have seen [name] in the office today who is being considered for a Watchman left atrial appendage closure device. I believe they will benefit from this procedure given their history of atrial fibrillation, CHA2DS2-VASc score of 3 and unadjusted ischemic stroke rate of 3.2% per year. Unfortunately, the patient is not felt to be a long term anticoagulation candidate secondary to increased risk of bleeding associated with cirrhosis and long term anticoagulation use. The patient's chart has been reviewed and I feel that they would be a candidate for short term oral anticoagulation after Watchman implant.   It is my belief that after undergoing a LAA closure procedure, Craig Calhoun will not need long term anticoagulation which eliminates anticoagulation side effects and major bleeding risk.   Procedural risks for the Watchman implant have been reviewed with the patient including a 0.5% risk of stroke, <1% risk of perforation and <1% risk of device embolization. Other risks include bleeding, vascular  damage, tamponade, worsening renal function, and death. The patient understands these risk and wishes to proceed.     The published clinical data on the safety and effectiveness of WATCHMAN include but are not limited to the following: - Holmes DR, Everlene Farrier, Sick P et al. for the PROTECT AF Investigators. Percutaneous closure of the left atrial appendage versus warfarin therapy for prevention of stroke in patients with atrial fibrillation: a randomised non-inferiority trial. Lancet 2009; 374: 534-42. Everlene Farrier, Doshi SK, Isa Rankin D et al. on behalf of the PROTECT AF Investigators. Percutaneous Left Atrial Appendage Closure for Stroke Prophylaxis in Patients With Atrial Fibrillation 2.3-Year Follow-up of the PROTECT AF (Watchman Left Atrial Appendage System for Embolic Protection in Patients With Atrial Fibrillation) Trial. Circulation 2013; 127:720-729. - Alli O, Doshi S,  Kar S, Reddy VY, Sievert H et al. Quality of Life Assessment in the Randomized PROTECT AF (Percutaneous Closure of the Left Atrial Appendage Versus Warfarin Therapy for Prevention of Stroke in Patients With Atrial Fibrillation) Trial of Patients at Risk for Stroke With Nonvalvular Atrial Fibrillation. J Am Coll Cardiol 2013; 61:1790-8. Aline August DR, Mia Creek, Price Judie Petit, Whisenant B, Sievert H, Doshi S, Huber K, Reddy V. Prospective randomized evaluation of the Watchman left atrial appendage Device  in patients with atrial fibrillation versus long-term warfarin therapy; the PREVAIL trial. Journal of the [employer] of Cardiology, Vol. 4, No. 1, 2014, 1-11. - Kar S, Doshi SK, Sadhu A, Horton R, Osorio J et al. Primary outcome evaluation of a next-generation left atrial appendage closure device: results from the PINNACLE FLX trial. Circulation 2021;143(18)1754-1762.    After today's visit with the patient which was dedicated solely for shared decision making visit regarding LAA closure device, the patient decided to proceed with  the LAA appendage closure procedure scheduled to be done in the near future at [facility]. Prior to the procedure, I would like to obtain a gated CT scan of the chest with contrast timed for PV/LA visualization.    HAS-BLED score 3 Hypertension Yes  Abnormal renal and liver function (Dialysis, transplant, Cr >2.26 mg/dL /Cirrhosis or Bilirubin >2x Normal or AST/ALT/AP >3x Normal) Yes  Stroke No  Bleeding No  Labile INR (Unstable/high INR) No  Elderly (>65) Yes  Drugs or alcohol (>= 8 drinks/week, anti-plt or NSAID) No   CHA2DS2-VASc Score = 3  The patient's score is based upon: CHF History: 1 HTN History: 1 Diabetes History: 0 Stroke History: 0 Vascular Disease History: 0 Age Score: 1 Gender Score: 0        Signed, Fredrick Geoghegan T. [name], MD, [facility], [facility] 05/20/2023 8:14 AM    Electrophysiology [facility]

## 2023-05-20 ENCOUNTER — Ambulatory Visit: Payer: PPO | Attending: Cardiology | Admitting: Cardiology

## 2023-05-20 ENCOUNTER — Encounter: Payer: Self-pay | Admitting: Cardiology

## 2023-05-20 VITALS — BP 152/90 | HR 63 | Ht 74.0 in | Wt 195.0 lb

## 2023-05-20 DIAGNOSIS — K7469 Other cirrhosis of liver: Secondary | ICD-10-CM | POA: Diagnosis not present

## 2023-05-20 DIAGNOSIS — I5022 Chronic systolic (congestive) heart failure: Secondary | ICD-10-CM | POA: Diagnosis not present

## 2023-05-20 DIAGNOSIS — I4819 Other persistent atrial fibrillation: Secondary | ICD-10-CM | POA: Diagnosis not present

## 2023-05-20 DIAGNOSIS — I700 Atherosclerosis of aorta: Secondary | ICD-10-CM | POA: Diagnosis not present

## 2023-05-20 NOTE — Patient Instructions (Signed)
Medication Instructions:  Your physician recommends that you continue on your current medications as directed. Please refer to the Current Medication list given to you today.  *If you need a refill on your cardiac medications before your next appointment, please call your pharmacy*  Lab Work: Your provider would like for you to return th week of February 10th to have the following labs drawn: BMET and CBC.   Please go to [facility] [address] ([facility] [address], [place] [address] You do not need an appointment.  They are open from 7:30 am-4 pm.  Lunch from 1:00 pm- 2:00 pm You do NO need to be fasting.  Testing/Procedures: Cardiac CT Your physician has requested that you have cardiac CT. Cardiac computed tomography (CT) is a painless test that uses an x-ray machine to take clear, detailed pictures of your heart. We will call you to schedule your CT scan. It will be done about 2-3 weeks prior to your ablation.   Ablation Your physician has recommended that you have an ablation. Catheter ablation is a medical procedure used to treat some cardiac arrhythmias (irregular heartbeats). During catheter ablation, a long, thin, flexible tube is put into a blood vessel in your groin (upper thigh), or neck. This tube is called an ablation catheter. It is then guided to your heart through the blood vessel. Radio frequency waves destroy small areas of heart tissue where abnormal heartbeats may cause an arrhythmia to start.  You are scheduled for Atrial Fibrillation Ablation on Friday, March 7 with Dr. Steffanie Dunn.Please arrive at the Main Entrance A at [facility]: [address] [place], [place] [address] at 8:00 AM    Watchman Your physician has requested that you have Left atrial appendage (LAA) closure device implantation is a procedure to put a small device in the LAA of the heart. The LAA is a small sac in the wall of the heart's left upper chamber. Blood  clots can form in this area. The device, Watchman closes the LAA to help prevent a blood clot and stroke.   Follow-Up: At [facility], you and your health needs are our priority.  As part of our continuing mission to provide you with exceptional heart care, we have created designated Provider Care Teams.  These Care Teams include your primary Cardiologist (physician) and Advanced Practice Providers (APPs -  Physician Assistants and Nurse Practitioners) who all work together to provide you with the care you need, when you need it.  Your next appointment:   We will call you to arrange your follow up appointments.

## 2023-06-04 ENCOUNTER — Ambulatory Visit: Payer: PPO | Attending: Cardiovascular Disease | Admitting: Nurse Practitioner

## 2023-06-04 ENCOUNTER — Encounter: Payer: Self-pay | Admitting: Cardiovascular Disease

## 2023-06-04 VITALS — BP 160/98 | HR 65 | Ht 74.0 in | Wt 196.0 lb

## 2023-06-04 DIAGNOSIS — I10 Essential (primary) hypertension: Secondary | ICD-10-CM

## 2023-06-04 DIAGNOSIS — I428 Other cardiomyopathies: Secondary | ICD-10-CM | POA: Diagnosis not present

## 2023-06-04 DIAGNOSIS — I2510 Atherosclerotic heart disease of native coronary artery without angina pectoris: Secondary | ICD-10-CM | POA: Diagnosis not present

## 2023-06-04 DIAGNOSIS — I4819 Other persistent atrial fibrillation: Secondary | ICD-10-CM | POA: Diagnosis not present

## 2023-06-04 DIAGNOSIS — I77810 Thoracic aortic ectasia: Secondary | ICD-10-CM

## 2023-06-04 NOTE — Patient Instructions (Signed)
Medication Instructions:  No changes *If you need a refill on your cardiac medications before your next appointment, please call your pharmacy*   Lab Work: None ordered If you have labs (blood work) drawn today and your tests are completely normal, you will receive your results only by: MyChart Message (if you have MyChart) OR A paper copy in the mail If you have any lab test that is abnormal or we need to change your treatment, we will call you to review the results.   Testing/Procedures: Your physician has requested that you have an echocardiogram. Echocardiography is a painless test that uses sound waves to create images of your heart. It provides your doctor with information about the size and shape of your heart and how well your heart's chambers and valves are working.   You may receive an ultrasound enhancing agent through an IV if needed to better visualize your heart during the echo. This procedure takes approximately one hour.  There are no restrictions for this procedure.  This will take place at [address] [facility]) [address], [place] [address]  Please note: We ask at that you not bring children with you during ultrasound (echo/ vascular) testing. Due to room size and safety concerns, children are not allowed in the ultrasound rooms during exams. Our front office staff cannot provide observation of children in our lobby area while testing is being conducted. An adult accompanying a patient to their appointment will only be allowed in the ultrasound room at the discretion of the ultrasound technician under special circumstances. We apologize for any inconvenience.    Follow-Up: At [facility], you and your health needs are our priority.  As part of our continuing mission to provide you with exceptional heart care, we have created designated Provider Care Teams.  These Care Teams include your primary Cardiologist (physician) and Advanced Practice  Providers (APPs -  Physician Assistants and Nurse Practitioners) who all work together to provide you with the care you need, when you need it.  We recommend signing up for the patient portal called "MyChart".  Sign up information is provided on this After Visit Summary.  MyChart is used to connect with patients for Virtual Visits (Telemedicine).  Patients are able to view lab/test results, encounter notes, upcoming appointments, etc.  Non-urgent messages can be sent to your provider as well.   To learn more about what you can do with MyChart, go to [email or web address].    Your next appointment:   3 month(s)  Provider:   You may see [name], MD or one of the following Advanced Practice Providers on your designated Care Team:   Nicolasa Ducking, NP  We would like you to check your blood pressure daily for the next week.  Keep a journal of these daily blood pressure and heart rate readings and call our office or send a message through MyChart with the results. Thank you!  It is best to check your BP 1-2 hours after taking your medications to see the medications effectiveness on your BP.    Here are some tips that our clinical pharmacists share for home BP monitoring:          Rest 10 minutes before taking your blood pressure.          Don't smoke or drink caffeinated beverages for at least 30 minutes before.          Take your blood pressure before (not after) you eat.  Sit comfortably with your back supported and both feet on the floor (don't cross your legs).          Elevate your arm to heart level on a table or a desk.          Use the proper sized cuff. It should fit smoothly and snugly around your bare upper arm. There should be enough room to slip a fingertip under the cuff. The bottom edge of the cuff should be 1 inch above the crease of the elbow.

## 2023-06-04 NOTE — Progress Notes (Signed)
Office Visit    Patient Name: Craig Calhoun Date of Encounter: 06/04/2023  Primary Care Provider:  [name], MD Primary Cardiologist:  [name], MD  Chief Complaint    72 y.o. male w/a h/o persistent Afib, HTN, dilated Asc Ao/root, GERD, hepatitis C, and tob abuse, who presents for afib f/u.  Past Medical History  Subjective   Past Medical History:  Diagnosis Date   Acquired dilation of ascending aorta and aortic root (HCC)    a. 02/2023 Echo: Asc Ao 42mm, Ao root 37mm.   CAD (coronary artery disease)    a. 03/26/2023 Cor CTA: Ca2+ 165 (48 percentile), 25-49% stenosis within the LAD and mid RCA, and less than 25% stenosis in the proximal circumflex.   GERD (gastroesophageal reflux disease)    Hepatitis C    Rx interferon/ribaviron 2004-'05 (recurred after Rx), treated and cleared with Harvoni 2016   Hypertension    NICM (nonischemic cardiomyopathy) (HCC)    a. Presumed to be 2/2 afib-->01/2019 Echo: EF 40-50%; b. 02/2019 Echo: EF 50-55% (sinus rhythm); c. 02/2023 Echo: EF 40-45%, glob HK, low-nl RV fxn, mildly dil LA, mod MR, Asc Ao 42mm, Ao root 37mm.   Persistent atrial fibrillation (HCC)    a. 01/2019 s/p DCCV (200J); b. 02/2023 s/p DCCV (150J - biphasic).   Past Surgical History:  Procedure Laterality Date   APPENDECTOMY     CARDIOVERSION N/A 02/07/2019   Procedure: CARDIOVERSION;  Surgeon: [name], MD;  Location: [facility] ORS;  Service: Cardiovascular;  Laterality: N/A;   CARDIOVERSION N/A 03/10/2023   Procedure: CARDIOVERSION;  Surgeon: Antonieta Iba, MD;  Location: [facility] ORS;  Service: Cardiovascular;  Laterality: N/A;   COLONOSCOPY  09/2010   1 polyp-tubular adenoma   COLONOSCOPY N/A 05/02/2016   Procedure: COLONOSCOPY;  Surgeon: [name], MD;  Location: [facility] ENDOSCOPY;  Service: Endoscopy;  Laterality: N/A;   COLONOSCOPY WITH PROPOFOL N/A 07/31/2021   Procedure: COLONOSCOPY WITH PROPOFOL;  Surgeon: [name], MD;  Location:  [facility] ENDOSCOPY;  Service: Endoscopy;  Laterality: N/A;   LIVER BIOPSY     LUMBAR LAMINECTOMY     MENISCUS REPAIR Left 4/14    Allergies  Allergies  Allergen Reactions   Lisinopril     Uvula swelling---?angioedema      History of Present Illness      72 y.o. y/o male w/a  h/o persistent Afib, HTN, dilated Asc Ao/root, GERD, hepatitis C, and tob abuse.  He was previously diagnosed with atrial fibrillation in 2020.  Echocardiogram in the setting of afib showed EF of 40 to 45%.  He subsequently underwent cardioversion in August 2020 with restoration of sinus rhythm and improvement in EF to 50-55%.  Of note, he was incidentally found to be COVID-positive prior to his cardioversion.    Craig Calhoun has been managed with beta-blocker and Eliquis therapy.  In August 2024, he was noted to have irregular heart rhythm by his primary care provider, and complained of progressive dyspnea on exertion.  Given high suspicion for recurrent atrial fibrillation, he was set up to see Dr. Kirke Corin and prior to that visit, an echocardiogram was performed, and showed recurrent LV dysfunction with an EF of 40 to 45% with global hypokinesis and low normal RV function.  The left atrium was mildly dilated.  Moderate MR was noted, along with a 42 mm ascending aorta and 37 mm aortic root.  He saw Dr. Kirke Corin on March 05, 2023 subsequently underwent successful cardioversion  on March 10, 2023.     Craig Calhoun was last seen in cardiology clinic on March 20, 2023.  He was experiencing some fatigue bradycardia with rates in the 40s at home.  Heart rate was 58 in sinus rhythm at the time of his office visit carvedilol was reduced to 3.125 mg twice daily.  Entresto was increased to 49-51 mg twice daily in the setting of hypotension.  Subsequent lab work showed stable renal function and electrolytes.  Coronary CT angiogram performed on 03/26/2023 showed a calcium score of 165 (48 percentile), and 25 to 49% stenosis within the LAD  and mid RCA, and less than 25% stenosis in the proximal circumflex.  Mild dilatation of the ascending aorta was noted with suspected hepatic cirrhosis on noncardiac imaging.    Since his last visit, Craig Calhoun has felt well.  He does not experience chest pain or dyspnea, and denies any known recurrence of atrial fibrillation.  He continues to work full-time and notes that he walks quite a bit at work without symptoms or limitations.  He remains off of cigarettes at this time.  He saw Dr. [name] on November 17, and is scheduled for catheter ablation and possible Watchman in the spring.  Multiple questions related to this today.  He denies PND, orthopnea, dizziness, syncope, edema, or early satiety. Objective  Home Medications    Current Outpatient Medications  Medication Sig Dispense Refill   carvedilol (COREG) 3.125 MG tablet Take 1 tablet (3.125 mg total) by mouth 2 (two) times daily with a meal. 180 tablet 3   diphenhydramine-acetaminophen (TYLENOL PM) 25-500 MG TABS tablet Take 1 tablet by mouth at bedtime as needed (sleep).     ELIQUIS 5 MG TABS tablet TAKE ONE TABLET TWICE DAILY 180 tablet 3   Melatonin 10 MG TABS Take 10 mg by mouth at bedtime.     sacubitril-valsartan (ENTRESTO) 49-51 MG Take 1 tablet by mouth 2 (two) times daily. 60 tablet 3   No current facility-administered medications for this visit.     Physical Exam    VS:  BP (!) 148/100 (BP Location: Left Arm, Patient Position: Sitting, Cuff Size: Normal)   Pulse 65   Ht 6\' 2"  (1.88 m)   Wt 196 lb (88.9 kg)   SpO2 98%   BMI 25.16 kg/m  , BMI Body mass index is 25.16 kg/m.     Vitals:   06/04/23 1527 06/04/23 1657  BP: (!) 148/100 (!) 160/98  Pulse: 65   SpO2: 98%       GEN: Well nourished, well developed, in no acute distress. HEENT: normal. Neck: Supple, no JVD, carotid bruits, or masses. Cardiac: RRR, no murmurs, rubs, or gallops. No clubbing, cyanosis, edema.  Radials 2+/PT 2+ and equal bilaterally.   Respiratory:  Respirations regular and unlabored, clear to auscultation bilaterally. GI: Soft, nontender, nondistended, BS + x 4. MS: no deformity or atrophy. Skin: warm and dry, no rash. Neuro:  Strength and sensation are intact. Psych: Normal affect.  Accessory Clinical Findings    ECG personally reviewed by me today - EKG Interpretation Date/Time:  Thursday June 04 2023 15:30:41 EST Ventricular Rate:  65 PR Interval:  170 QRS Duration:  86 QT Interval:  414 QTC Calculation: 430 R Axis:   57  Text Interpretation: Sinus rhythm with Premature atrial complexes Confirmed by Nicolasa Ducking [phone]) on 06/04/2023 3:46:11 PM  - no acute changes.  Lab Results  Component Value Date   WBC 7.8 03/05/2023  HGB 15.5 03/05/2023   HCT 47.8 03/05/2023   MCV 96 03/05/2023   PLT 232 03/05/2023   Lab Results  Component Value Date   CREATININE 0.94 03/24/2023   BUN 18 03/24/2023   NA 141 03/24/2023   K 4.5 03/24/2023   CL 104 03/24/2023   CO2 24 03/24/2023   Lab Results  Component Value Date   ALT 16 03/05/2021   AST 22 03/05/2021   ALKPHOS 66 03/05/2021   BILITOT 0.8 03/05/2021   Lab Results  Component Value Date   CHOL 182 12/17/2018   HDL 36.00 (L) 12/17/2018   LDLCALC 106 (H) 12/17/2018   TRIG 196.0 (H) 12/17/2018   CHOLHDL 5 12/17/2018    No results found for: "HGBA1C" Lab Results  Component Value Date   TSH 0.54 10/27/2012       Assessment & Plan    1.  Persistent Afib:  s/p DCCV in 02/2023 w/ subsequent follow-up with Dr. [name] and plan for A-fib ablation in the spring 2025 with possible watchman placement if anatomy is suitable.  Patient denies palpitations and has been feeling well without fatigue, chest pain, or dyspnea.  He remains in sinus rhythm today.  Continue beta-blocker and Eliquis therapy.  2.  Chronic heart failure with midrange ejection fraction/nonischemic cardiomyopathy: EF 40 to 45% with global hypokinesis in the setting of recurrent  atrial fibrillation.  He has been feeling well following cardioversion in September and remains on beta-blocker and Entresto therapy.  He is euvolemic on examination today.  Discussion today about the empiric value of guideline directed medical therapy in the setting of LV dysfunction.  Blood pressure elevated today and we discussed increasing Entresto though, he thinks it is elevated from being here in the office and prefers to follow his blood pressure at home prior to adjusting medications.  Will arrange for follow-up echocardiogram to reevaluate LV function sinus rhythm.  3.  Dilated ascending aorta: 4.1 cm CT of the chest.  Continue beta-blocker.  Patient to monitor blood pressure at home and notify us.  I suspect he will require additional titration of his Entresto.  4.  Moderate mitral regurgitation: The setting of A-fib with reduced EF.  Plan for follow-up echo as above.  5.  Hypertension: Blood pressure elevated today on 2 readings.  Patient thinks is just because he is nervous for being here.  He will follow pressures at home and contact us within the next week.  Will consider increasing Entresto at that time.  6.  Tobacco abuse: Quit smoking in August.  Congratulated on remaining off cigarettes and strongly encouraged to stay off cigarettes.  7.  Nonobstructive CAD: Coronary CT angiogram with mild to moderate nonobstructive CAD.  Discussed that ideally, we would at least low-dose statin.  LDL was 106 in 2020 and has not been checked since then.  We also discussed the role of lifestyle modifications including aggressive dietary modifications, reduced fat and cholesterol intake, predominantly plant-based whole food diet, and regular moderate exercise (30 minutes daily).  Patient prefers lifestyle modification approach initially and plans to cut out red meat and eat more fish.  We can look to follow-up lipids at a later date when he is fasting.  8.  Disposition: Follow-up echo.  Follow-up in  clinic in 3 months or sooner if necessary.  Nicolasa Ducking, NP 06/04/2023, 4:36 PM

## 2023-06-19 ENCOUNTER — Telehealth: Payer: Self-pay

## 2023-06-19 ENCOUNTER — Other Ambulatory Visit: Payer: Self-pay

## 2023-06-19 DIAGNOSIS — I5022 Chronic systolic (congestive) heart failure: Secondary | ICD-10-CM

## 2023-06-19 DIAGNOSIS — I4819 Other persistent atrial fibrillation: Secondary | ICD-10-CM

## 2023-06-19 DIAGNOSIS — I700 Atherosclerosis of aorta: Secondary | ICD-10-CM

## 2023-06-19 DIAGNOSIS — K7469 Other cirrhosis of liver: Secondary | ICD-10-CM

## 2023-06-19 NOTE — Telephone Encounter (Signed)
The patient agreed to proceed with PVI/LAAO on 09/09/2023. cCT and pre-procedure visit scheduled 08/24/2023. The patient will have labs drawn closer to home a few days before CT. He was grateful for call and agreed with plan.

## 2023-06-19 NOTE — Telephone Encounter (Signed)
Called to confirm concomitant procedure date of 09/09/2023 (he was previously told 3/7) and to schedule pre-procedure visit and CT.  Left message to call back.

## 2023-06-23 ENCOUNTER — Other Ambulatory Visit: Payer: Self-pay | Admitting: Nurse Practitioner

## 2023-06-25 MED ORDER — SACUBITRIL-VALSARTAN 97-103 MG PO TABS: 1.0000 | ORAL_TABLET | Freq: Two times a day (BID) | ORAL | 3 refills | Status: DC

## 2023-06-25 NOTE — Telephone Encounter (Signed)
 This is a Estate agent.

## 2023-06-29 MED ORDER — ENTRESTO 49-51 MG PO TABS: 1.0000 | ORAL_TABLET | Freq: Two times a day (BID) | ORAL | 3 refills | Status: DC

## 2023-06-29 NOTE — Addendum Note (Signed)
 Addended by: Parke Poisson on: 06/29/2023 05:17 PM   Modules accepted: Orders

## 2023-07-02 ENCOUNTER — Ambulatory Visit: Payer: PPO | Attending: Nurse Practitioner

## 2023-07-02 ENCOUNTER — Other Ambulatory Visit: Payer: Self-pay | Admitting: Nurse Practitioner

## 2023-07-02 DIAGNOSIS — I428 Other cardiomyopathies: Secondary | ICD-10-CM | POA: Diagnosis not present

## 2023-07-02 DIAGNOSIS — K7469 Other cirrhosis of liver: Secondary | ICD-10-CM | POA: Diagnosis not present

## 2023-07-08 ENCOUNTER — Ambulatory Visit
Admission: RE | Admit: 2023-07-08 | Discharge: 2023-07-08 | Disposition: A | Discharge status: Home / Self Care | Payer: PPO | Source: Ambulatory Visit | Attending: Nurse Practitioner | Admitting: Nurse Practitioner

## 2023-07-08 DIAGNOSIS — K7469 Other cirrhosis of liver: Secondary | ICD-10-CM

## 2023-07-08 DIAGNOSIS — K7460 Unspecified cirrhosis of liver: Secondary | ICD-10-CM | POA: Diagnosis not present

## 2023-07-08 DIAGNOSIS — K824 Cholesterolosis of gallbladder: Secondary | ICD-10-CM | POA: Diagnosis not present

## 2023-08-21 ENCOUNTER — Telehealth: Payer: Self-pay

## 2023-08-21 ENCOUNTER — Telehealth (HOSPITAL_COMMUNITY): Payer: Self-pay | Admitting: *Deleted

## 2023-08-21 NOTE — Telephone Encounter (Signed)
 Per [facility], regretfully informed the patient that his planned concomitant ablation/LAAO on 3/19 will no longer occur due to insurance issues. After much discussion, the patient will keep his CT as scheduled on 3/3 and understands he will have ablation only on 3/19. He was grateful for call and agreed with plan.   Will route to EP scheduler and Heritage manager as FYI.

## 2023-08-21 NOTE — Telephone Encounter (Signed)
 Reaching out to patient to offer assistance regarding upcoming cardiac imaging study; pt verbalizes understanding of appt date/time, and where to check in, and verified current allergies; name and call back number provided for further questions should they arise  Craig Brick RN Navigator Cardiac Imaging [facility] [phone] office [phone] cell  Patient aware to arrive at 7:30 AM.

## 2023-08-24 ENCOUNTER — Ambulatory Visit (HOSPITAL_COMMUNITY)
Admission: RE | Admit: 2023-08-24 | Discharge: 2023-08-24 | Disposition: A | Discharge status: Home / Self Care | Payer: PPO | Source: Ambulatory Visit | Attending: Internal Medicine | Admitting: Internal Medicine

## 2023-08-24 ENCOUNTER — Ambulatory Visit: Payer: PPO

## 2023-08-24 DIAGNOSIS — I4819 Other persistent atrial fibrillation: Secondary | ICD-10-CM | POA: Insufficient documentation

## 2023-08-24 DIAGNOSIS — K7469 Other cirrhosis of liver: Secondary | ICD-10-CM | POA: Diagnosis not present

## 2023-08-24 DIAGNOSIS — I700 Atherosclerosis of aorta: Secondary | ICD-10-CM | POA: Diagnosis not present

## 2023-08-24 DIAGNOSIS — I5022 Chronic systolic (congestive) heart failure: Secondary | ICD-10-CM | POA: Insufficient documentation

## 2023-08-24 MED ORDER — IOHEXOL 350 MG/ML SOLN
75.0000 mL | Freq: Once | INTRAVENOUS | Status: AC | PRN
  Administered 2023-08-24: 75 mL via INTRAVENOUS

## 2023-08-27 ENCOUNTER — Telehealth: Payer: Self-pay

## 2023-08-27 DIAGNOSIS — I4819 Other persistent atrial fibrillation: Secondary | ICD-10-CM

## 2023-08-27 NOTE — Telephone Encounter (Signed)
 Pt scheduled for Afib Ablation with Dr. [name] on 3/19 at 8:30 am. He will go to [place] office to have updated labs done.   Instruction letter has been sent via MyChart.

## 2023-09-01 ENCOUNTER — Telehealth: Payer: Self-pay

## 2023-09-01 NOTE — Telephone Encounter (Signed)
 Marland Kitchen

## 2023-09-02 DIAGNOSIS — I4819 Other persistent atrial fibrillation: Secondary | ICD-10-CM | POA: Diagnosis not present

## 2023-09-03 ENCOUNTER — Telehealth (HOSPITAL_COMMUNITY): Payer: Self-pay

## 2023-09-03 LAB — BASIC METABOLIC PANEL WITH GFR
BUN/Creatinine Ratio: 19 (ref 10–24)
BUN: 17 mg/dL (ref 8–27)
CO2: 21 mmol/L (ref 20–29)
Calcium: 9.1 mg/dL (ref 8.6–10.2)
Chloride: 103 mmol/L (ref 96–106)
Creatinine, Ser: 0.88 mg/dL (ref 0.76–1.27)
Glucose: 90 mg/dL (ref 70–99)
Potassium: 4.3 mmol/L (ref 3.5–5.2)
Sodium: 141 mmol/L (ref 134–144)
eGFR: 91 mL/min/1.73 (ref >59)

## 2023-09-03 LAB — CBC
Hematocrit: 45.2 % (ref 37.5–51.0)
Hemoglobin: 15.1 g/dL (ref 13.0–17.7)
MCH: 31 pg (ref 26.6–33.0)
MCHC: 33.4 g/dL (ref 31.5–35.7)
MCV: 93 fL (ref 79–97)
Platelets: 255 10*3/uL (ref 150–450)
RBC: 4.87 x10E6/uL (ref 4.14–5.80)
RDW: 12.8 % (ref 11.6–15.4)
WBC: 6.6 10*3/uL (ref 3.4–10.8)

## 2023-09-03 NOTE — Telephone Encounter (Signed)
 Call placed to patient to discuss upcoming procedure.   CT: completed and acceptable.  Labs: completed and acceptable.   Any recent signs of acute illness or been started on antibiotics? No Any medications to hold? No Any missed doses of blood thinner? No Advised patient to continue taking ANTICOAGULANT: Eliquis (Apixaban) without missing any doses.  Medication instructions:  On the morning of your procedure DO NOT take any medication., including Eliquis or the procedure may be rescheduled. Nothing to eat or drink after midnight prior to your procedure.  Confirmed patient is scheduled for Atrial Fibrillation Ablation on Wednesday, March 19 with Dr. Steffanie Dunn. Instructed patient to arrive at the Main Entrance A at [facility]: [address] [place], [place] [address] and check in at Admitting at 6:30 AM.  Advised of plan to go home the same day and will only stay overnight if medically necessary. You MUST have a responsible adult to drive you home and MUST be with you the first 24 hours after you arrive home or your procedure could be cancelled.  Patient verbalized understanding to all instructions provided and agreed to proceed with procedure.

## 2023-09-08 NOTE — Pre-Procedure Instructions (Signed)
 Instructed patient on the following items: Arrival time 0615 Nothing to eat or drink after midnight No meds AM of procedure Responsible person to drive you home and stay with you for 24 hrs  Have you missed any doses of anti-coagulant Eliquis- Takes twice a day, hasn't missed any doses in the last 4 weeks.  Don't take dose morning of procedure.

## 2023-09-09 ENCOUNTER — Encounter (HOSPITAL_COMMUNITY): Payer: Self-pay | Admitting: Cardiology

## 2023-09-09 ENCOUNTER — Other Ambulatory Visit: Payer: Self-pay

## 2023-09-09 ENCOUNTER — Ambulatory Visit (HOSPITAL_BASED_OUTPATIENT_CLINIC_OR_DEPARTMENT_OTHER)

## 2023-09-09 ENCOUNTER — Encounter (HOSPITAL_COMMUNITY)
Admission: RE | Disposition: A | Discharge status: Home / Self Care | Payer: Self-pay | Source: Home / Self Care | Attending: Cardiology

## 2023-09-09 ENCOUNTER — Other Ambulatory Visit (HOSPITAL_COMMUNITY): Payer: Self-pay

## 2023-09-09 ENCOUNTER — Ambulatory Visit (HOSPITAL_COMMUNITY)

## 2023-09-09 ENCOUNTER — Ambulatory Visit (HOSPITAL_COMMUNITY)
Admission: RE | Admit: 2023-09-09 | Discharge: 2023-09-09 | Disposition: A | Discharge status: Home / Self Care | Payer: PPO | Attending: Cardiology | Admitting: Cardiology

## 2023-09-09 DIAGNOSIS — I10 Essential (primary) hypertension: Secondary | ICD-10-CM | POA: Diagnosis not present

## 2023-09-09 DIAGNOSIS — I5022 Chronic systolic (congestive) heart failure: Secondary | ICD-10-CM | POA: Diagnosis not present

## 2023-09-09 DIAGNOSIS — K7469 Other cirrhosis of liver: Secondary | ICD-10-CM | POA: Diagnosis not present

## 2023-09-09 DIAGNOSIS — I4891 Unspecified atrial fibrillation: Secondary | ICD-10-CM

## 2023-09-09 DIAGNOSIS — Z7901 Long term (current) use of anticoagulants: Secondary | ICD-10-CM | POA: Insufficient documentation

## 2023-09-09 DIAGNOSIS — I4819 Other persistent atrial fibrillation: Secondary | ICD-10-CM | POA: Diagnosis not present

## 2023-09-09 DIAGNOSIS — Z79899 Other long term (current) drug therapy: Secondary | ICD-10-CM | POA: Diagnosis not present

## 2023-09-09 DIAGNOSIS — I110 Hypertensive heart disease with heart failure: Secondary | ICD-10-CM | POA: Insufficient documentation

## 2023-09-09 DIAGNOSIS — K219 Gastro-esophageal reflux disease without esophagitis: Secondary | ICD-10-CM | POA: Insufficient documentation

## 2023-09-09 DIAGNOSIS — Z87891 Personal history of nicotine dependence: Secondary | ICD-10-CM

## 2023-09-09 DIAGNOSIS — I700 Atherosclerosis of aorta: Secondary | ICD-10-CM | POA: Diagnosis not present

## 2023-09-09 DIAGNOSIS — I2510 Atherosclerotic heart disease of native coronary artery without angina pectoris: Secondary | ICD-10-CM | POA: Diagnosis not present

## 2023-09-09 HISTORY — PX: ATRIAL FIBRILLATION ABLATION: EP1191

## 2023-09-09 LAB — POCT ACTIVATED CLOTTING TIME: Activated Clotting Time: 250 s

## 2023-09-09 SURGERY — ATRIAL FIBRILLATION ABLATION
Anesthesia: General

## 2023-09-09 MED ORDER — SODIUM CHLORIDE 0.9 % IV SOLN: INTRAVENOUS | Status: DC

## 2023-09-09 MED ORDER — PHENYLEPHRINE 80 MCG/ML (10ML) SYRINGE FOR IV PUSH (FOR BLOOD PRESSURE SUPPORT)
PREFILLED_SYRINGE | INTRAVENOUS | Status: DC | PRN
  Administered 2023-09-09: 80 ug via INTRAVENOUS

## 2023-09-09 MED ORDER — ROCURONIUM BROMIDE 10 MG/ML (PF) SYRINGE
PREFILLED_SYRINGE | INTRAVENOUS | Status: DC | PRN
  Administered 2023-09-09: 20 mg via INTRAVENOUS
  Administered 2023-09-09: 10 mg via INTRAVENOUS
  Administered 2023-09-09: 50 mg via INTRAVENOUS

## 2023-09-09 MED ORDER — SODIUM CHLORIDE 0.9 % IV SOLN: 250.0000 mL | INTRAVENOUS | Status: DC | PRN

## 2023-09-09 MED ORDER — COLCHICINE 0.6 MG PO TABS
0.6000 mg | ORAL_TABLET | Freq: Two times a day (BID) | ORAL | Status: DC
  Administered 2023-09-09: 0.6 mg via ORAL
  Filled 2023-09-09 (×2): qty 1

## 2023-09-09 MED ORDER — HEPARIN (PORCINE) IN NACL 1000-0.9 UT/500ML-% IV SOLN
INTRAVENOUS | Status: DC | PRN
  Administered 2023-09-09 (×3): 500 mL

## 2023-09-09 MED ORDER — ONDANSETRON HCL 4 MG/2ML IJ SOLN
INTRAMUSCULAR | Status: DC | PRN
  Administered 2023-09-09: 4 mg via INTRAVENOUS

## 2023-09-09 MED ORDER — APIXABAN 5 MG PO TABS
5.0000 mg | ORAL_TABLET | Freq: Two times a day (BID) | ORAL | Status: DC
  Administered 2023-09-09: 5 mg via ORAL
  Filled 2023-09-09: qty 1

## 2023-09-09 MED ORDER — SODIUM CHLORIDE 0.9% FLUSH: 3.0000 mL | Freq: Two times a day (BID) | INTRAVENOUS | Status: DC

## 2023-09-09 MED ORDER — DEXAMETHASONE SODIUM PHOSPHATE 10 MG/ML IJ SOLN
INTRAMUSCULAR | Status: DC | PRN
  Administered 2023-09-09: 10 mg via INTRAVENOUS

## 2023-09-09 MED ORDER — ATROPINE SULFATE 1 MG/ML IV SOLN
INTRAVENOUS | Status: DC | PRN
Start: 2023-09-09 — End: 2023-09-09
  Administered 2023-09-09: 1 mg via INTRAVENOUS

## 2023-09-09 MED ORDER — PANTOPRAZOLE SODIUM 40 MG PO TBEC
40.0000 mg | DELAYED_RELEASE_TABLET | Freq: Every day | ORAL | 0 refills | Status: DC
Start: 2023-09-09 — End: 2023-10-06
  Filled 2023-09-09: qty 45, 45d supply, fill #0

## 2023-09-09 MED ORDER — FENTANYL CITRATE (PF) 250 MCG/5ML IJ SOLN
INTRAMUSCULAR | Status: DC | PRN
  Administered 2023-09-09: 100 ug via INTRAVENOUS

## 2023-09-09 MED ORDER — EPHEDRINE SULFATE-NACL 50-0.9 MG/10ML-% IV SOSY
PREFILLED_SYRINGE | INTRAVENOUS | Status: DC | PRN
  Administered 2023-09-09: 5 mg via INTRAVENOUS

## 2023-09-09 MED ORDER — PHENYLEPHRINE HCL-NACL 20-0.9 MG/250ML-% IV SOLN
INTRAVENOUS | Status: DC | PRN
  Administered 2023-09-09: 30 ug/min via INTRAVENOUS

## 2023-09-09 MED ORDER — LIDOCAINE 2% (20 MG/ML) 5 ML SYRINGE
INTRAMUSCULAR | Status: DC | PRN
  Administered 2023-09-09: 40 mg via INTRAVENOUS

## 2023-09-09 MED ORDER — SUGAMMADEX SODIUM 200 MG/2ML IV SOLN
INTRAVENOUS | Status: DC | PRN
  Administered 2023-09-09: 350 mg via INTRAVENOUS

## 2023-09-09 MED ORDER — PANTOPRAZOLE SODIUM 40 MG PO TBEC
40.0000 mg | DELAYED_RELEASE_TABLET | Freq: Every day | ORAL | Status: DC
  Administered 2023-09-09: 40 mg via ORAL
  Filled 2023-09-09 (×2): qty 1

## 2023-09-09 MED ORDER — SODIUM CHLORIDE 0.9% FLUSH: 3.0000 mL | INTRAVENOUS | Status: DC | PRN

## 2023-09-09 MED ORDER — COLCHICINE 0.6 MG PO TABS
0.6000 mg | ORAL_TABLET | Freq: Two times a day (BID) | ORAL | 0 refills | Status: DC
  Filled 2023-09-09: qty 10, 5d supply, fill #0

## 2023-09-09 MED ORDER — PROTAMINE SULFATE 10 MG/ML IV SOLN
INTRAVENOUS | Status: DC | PRN
  Administered 2023-09-09: 35 mg via INTRAVENOUS

## 2023-09-09 MED ORDER — ONDANSETRON HCL 4 MG/2ML IJ SOLN: 4.0000 mg | Freq: Four times a day (QID) | INTRAMUSCULAR | Status: DC | PRN

## 2023-09-09 MED ORDER — HEPARIN SODIUM (PORCINE) 1000 UNIT/ML IJ SOLN
INTRAMUSCULAR | Status: DC | PRN
Start: 2023-09-09 — End: 2023-09-09
  Administered 2023-09-09: 7000 [IU] via INTRAVENOUS
  Administered 2023-09-09: 14000 [IU] via INTRAVENOUS

## 2023-09-09 MED ORDER — ACETAMINOPHEN 325 MG PO TABS: 650.0000 mg | ORAL_TABLET | ORAL | Status: DC | PRN

## 2023-09-09 MED ORDER — FENTANYL CITRATE (PF) 100 MCG/2ML IJ SOLN
INTRAMUSCULAR | Status: AC
  Filled 2023-09-09: qty 2

## 2023-09-09 MED ORDER — PROPOFOL 10 MG/ML IV BOLUS
INTRAVENOUS | Status: DC | PRN
  Administered 2023-09-09: 200 mg via INTRAVENOUS

## 2023-09-09 SURGICAL SUPPLY — 19 items
BAG SNAP BAND KOVER 36X36 (MISCELLANEOUS) IMPLANT
CABLE PFA RX CATH CONN (CABLE) IMPLANT
CATH FARAWAVE ABLATION 31 (CATHETERS) IMPLANT
CATH OCTARAY 2.0 F 3-3-3-3-3 (CATHETERS) IMPLANT
CATH SOUNDSTAR ECO 8FR (CATHETERS) IMPLANT
CATH WEB BI DIR CSDF CRV REPRO (CATHETERS) IMPLANT
CLOSURE PERCLOSE PROSTYLE (VASCULAR PRODUCTS) IMPLANT
COVER SWIFTLINK CONNECTOR (BAG) ×2 IMPLANT
DILATOR VESSEL 38 20CM 16FR (INTRODUCER) IMPLANT
GUIDEWIRE INQWIRE 1.5J.035X260 (WIRE) IMPLANT
INQWIRE 1.5J .035X260CM (WIRE) ×1 IMPLANT
KIT VERSACROSS CNCT FARADRIVE (KITS) IMPLANT
PACK EP LF (CUSTOM PROCEDURE TRAY) ×2 IMPLANT
PAD DEFIB RADIO PHYSIO CONN (PAD) ×2 IMPLANT
PATCH CARTO3 (PAD) IMPLANT
SHEATH FARADRIVE STEERABLE (SHEATH) IMPLANT
SHEATH PINNACLE 8F 10CM (SHEATH) IMPLANT
SHEATH PINNACLE 9F 10CM (SHEATH) IMPLANT
SHEATH PROBE COVER 6X72 (BAG) IMPLANT

## 2023-09-09 NOTE — Anesthesia Preprocedure Evaluation (Addendum)
 Anesthesia Evaluation  Patient identified by MRN, date of birth, ID band Patient awake    Reviewed: Allergy & Precautions, NPO status , Patient's Chart, lab work & pertinent test results, reviewed documented beta blocker date and time   History of Anesthesia Complications Negative for: history of anesthetic complications  Airway Mallampati: II  TM Distance: >3 FB Neck ROM: Full    Dental  (+) Dental Advisory Given   Pulmonary former smoker (quit "a few months ago")   Pulmonary exam normal        Cardiovascular hypertension, Pt. on medications and Pt. on home beta blockers + CAD  Normal cardiovascular exam+ dysrhythmias Atrial Fibrillation    '25 TTE - EF 55-60%. Mild MR. Aortic root dilated to 39mm, ascending aorta to 42mm.     Neuro/Psych negative neurological ROS  negative psych ROS   GI/Hepatic ,GERD  Controlled,,(+) Cirrhosis       , Hepatitis -, C  Endo/Other  negative endocrine ROS    Renal/GU negative Renal ROS     Musculoskeletal negative musculoskeletal ROS (+)    Abdominal   Peds  Hematology  On eliquis    Anesthesia Other Findings   Reproductive/Obstetrics                             Anesthesia Physical Anesthesia Plan  ASA: 3  Anesthesia Plan: General   Post-op Pain Management: Minimal or no pain anticipated   Induction: Intravenous  PONV Risk Score and Plan: 2 and Treatment may vary due to age or medical condition, Ondansetron and Dexamethasone  Airway Management Planned: Oral ETT  Additional Equipment: None  Intra-op Plan:   Post-operative Plan: Extubation in OR  Informed Consent: I have reviewed the patients History and Physical, chart, labs and discussed the procedure including the risks, benefits and alternatives for the proposed anesthesia with the patient or authorized representative who has indicated his/her understanding and acceptance.      Dental advisory given  Plan Discussed with: CRNA and Anesthesiologist  Anesthesia Plan Comments:        Anesthesia Quick Evaluation

## 2023-09-09 NOTE — Progress Notes (Signed)
Patient and wife was given discharge instructions, Both verbalized understanding.

## 2023-09-09 NOTE — Discharge Instructions (Signed)

## 2023-09-09 NOTE — Transfer of Care (Signed)
 Immediate Anesthesia Transfer of Care Note  Patient: Craig Calhoun  Procedure(s) Performed: ATRIAL FIBRILLATION ABLATION  Patient Location: Cath Lab  Anesthesia Type:General  Level of Consciousness: awake, alert , and oriented  Airway & Oxygen Therapy: Patient Spontanous Breathing and Patient connected to nasal cannula oxygen  Post-op Assessment: Report given to RN and Post -op Vital signs reviewed and stable  Post vital signs: Reviewed and stable  Last Vitals:  Vitals Value Taken Time  BP 124/79 09/09/23 1025  Temp    Pulse 79 09/09/23 1028  Resp 18 09/09/23 1028  SpO2 96 % 09/09/23 1028  Vitals shown include unfiled device data.  Last Pain:  Vitals:   09/09/23 0758  TempSrc:   PainSc: 0-No pain         Complications: No notable events documented.

## 2023-09-09 NOTE — H&P (Signed)
 Electrophysiology Office Note:     Date:  09/09/2023    ID:  ASIM GERSTEN, DOB May 29, 1951, MRN [id number]   [facility] Cardiologist:  [name], MD  [facility] Electrophysiologist:  Lanier Prude, MD    Referring MD: [name], MD    Chief Complaint: Atrial fibrillation   History of Present Illness:     Mr. Hollerbach is a 73 year old man who I am seeing today for an evaluation of persistent atrial fibrillation.  His medical history includes persistent atrial fibrillation hypertension, dilated ascending aorta, GERD, hepatitis C (treated), tobacco use and chronic systolic heart failure.  He saw Christain Sacramento in clinic on March 20, 2023.  He had a recent cardioversion for his atrial fibrillation.  After the cardioversion his heart rates were noted to be in the 40s and 50s.  He had shortness of breath while in atrial fibrillation with some improvement back in sinus rhythm.  He is on Eliquis for stroke prophylaxis.   Discussed the use of AI scribe software for clinical note transcription with the patient, who gave verbal consent to proceed.   History of Present Illness   The patient, with a history of atrial fibrillation (AFib) and reduced heart function, presents for a discussion about potential treatment options. He is currently on Eliquis for stroke prevention and Entresto for heart function. He expresses interest in an ablation procedure, as a friend of his had undergone the procedure with successful results. The patient reports that this is the second time he's had AFib and is considering intervention. He also expresses concern about the high cost of his medication, particularly Entresto, which costs him $160 a month. The patient has a history of hepatitis C and cirrhosis which increases his risk of bleeding while on Eliquis.      Presents for AF ablation. Procedure reviewed.             Objective Their past medical, social and family history was reveiwed.      ROS:   Please see the history of present illness.    All other systems reviewed and are negative.   EKGs/Labs/Other Studies Reviewed:     The following studies were reviewed today:   April 18, 2023 CTA coronary Mild dilation of the ascending aorta Suspected cirrhosis 40th percentile for coronary artery calcium   March 03, 2023 echo EF 40-45 RV normal/low normal Mildly dilated left atrium          Physical Exam:     VS:  BP 152/90   Pulse 63   Ht 6\' 2"  (1.88 m)   Wt 195 lb (88.5 kg)   SpO2 95%   BMI 25.04 kg/m         Wt Readings from Last 3 Encounters:  05/20/23 195 lb (88.5 kg)  03/20/23 193 lb 4 oz (87.7 kg)  03/10/23 188 lb (85.3 kg)      GEN: well appearing male, no distress CARD: RRR, no IWOB. RESP: No IWOB.       Assessment ASSESSMENT AND PLAN:     1. Persistent atrial fibrillation (HCC)   2. Other cirrhosis of liver (HCC)   3. Aortic atherosclerosis (HCC)   4. Chronic systolic heart failure (HCC)           Assessment and Plan    Atrial Fibrillation Recurrent episodes. Discussed the benefits of early intervention with ablation to prevent scar tissue formation and increase chances of maintaining sinus rhythm. Also discussed the potential for a  Watchman device to provide stroke protection while potentially allowing discontinuation of anticoagulation in the future given his history of cirrhosis and HCV (treated). -Schedule for ablation procedure. -Order CT scan of the chest to assess candidacy for Watchman device. - If CT scan shows favorable appendage anatomy, plan for concomitant ablation/watchman implant.   Reduced Ejection Fraction EF 40-45%, likely secondary to AFib. Currently on Entresto for management. Discussed potential for improvement with successful rhythm control. -Continue Entresto.          Discussed treatment options today for AF including antiarrhythmic drug therapy and ablation. Discussed risks, recovery and  likelihood of success with each treatment strategy. Risk, benefits, and alternatives to EP study and ablation for afib were discussed. These risks include but are not limited to stroke, bleeding, vascular damage, tamponade, perforation, damage to the esophagus, lungs, phrenic nerve and other structures, pulmonary vein stenosis, worsening renal function, coronary vasospasm and death.  Discussed potential need for repeat ablation procedures and antiarrhythmic drugs after an initial ablation. The patient understands these risk and wishes to proceed.  We will therefore proceed with catheter ablation at the next available time.  Carto, ICE, anesthesia are requested for the procedure.  Will also obtain CT PV protocol prior to the procedure to exclude LAA thrombus and further evaluate atrial anatomy.    HAS-BLED score 3 Hypertension Yes  Abnormal renal and liver function (Dialysis, transplant, Cr >2.26 mg/dL /Cirrhosis or Bilirubin >2x Normal or AST/ALT/AP >3x Normal) Yes  Stroke No  Bleeding No  Labile INR (Unstable/high INR) No  Elderly (>65) Yes  Drugs or alcohol (>= 8 drinks/week, anti-plt or NSAID) No    CHA2DS2-VASc Score = 3  The patient's score is based upon: CHF History: 1 HTN History: 1 Diabetes History: 0 Stroke History: 0 Vascular Disease History: 0 Age Score: 1 Gender Score: 0     Presents for AF ablation. Procedure reviewed.         Signed, Rossie Muskrat. [name], MD, [facility], [facility] 09/09/2023 Electrophysiology [facility]

## 2023-09-09 NOTE — Anesthesia Procedure Notes (Signed)
 Procedure Name: Intubation Date/Time: 09/09/2023 8:50 AM  Performed by: April Holding, CRNAPre-anesthesia Checklist: Patient identified, Emergency Drugs available, Suction available and Patient being monitored Patient Re-evaluated:Patient Re-evaluated prior to induction Oxygen Delivery Method: Circle System Utilized Preoxygenation: Pre-oxygenation with 100% oxygen Induction Type: IV induction Ventilation: Mask ventilation without difficulty Laryngoscope Size: Miller and 2 Grade View: Grade II Tube type: Oral Tube size: 7.5 mm Number of attempts: 1 Airway Equipment and Method: Stylet, Oral airway and Bite block Placement Confirmation: ETT inserted through vocal cords under direct vision, positive ETCO2 and breath sounds checked- equal and bilateral Secured at: 24 cm Tube secured with: Tape Dental Injury: Teeth and Oropharynx as per pre-operative assessment

## 2023-09-10 ENCOUNTER — Encounter (HOSPITAL_COMMUNITY): Payer: Self-pay | Admitting: Cardiology

## 2023-09-10 ENCOUNTER — Telehealth: Payer: Self-pay | Admitting: Nurse Practitioner

## 2023-09-10 ENCOUNTER — Telehealth (HOSPITAL_COMMUNITY): Payer: Self-pay

## 2023-09-10 MED FILL — Fentanyl Citrate Preservative Free (PF) Inj 100 MCG/2ML: INTRAMUSCULAR | Qty: 2 | Status: AC

## 2023-09-10 NOTE — Telephone Encounter (Signed)
 Spoke with patient to complete post procedure follow up call.  Patient reports no complications with groin sites.   Instructions reviewed with patient:  Remove large bandage at puncture site after 24 hours. It is normal to have bruising, tenderness and a pea or marble sized lump/knot at the groin site which can take up to three months to resolve.  Get help right away if you notice sudden swelling at the puncture site.  Check your puncture site every day for signs of infection: fever, redness, swelling, pus drainage, warmth, foul odor or excessive pain. If this occurs, please call the office at [phone], to speak with the nurse. Get help right away if your puncture site is bleeding and the bleeding does not stop after applying firm pressure to the area.  You may continue to have skipped beats/ atrial fibrillation during the first several months after your procedure.  It is very important not to miss any doses of your blood thinner Eliquis. Patient restarted taking this medication on yesterday, 09/09/23.   You will follow up with the APP on 10/06/23 and follow up with the APP on 12/08/23.   Patient verbalized understanding to all instructions provided.

## 2023-09-10 NOTE — Anesthesia Postprocedure Evaluation (Signed)
 Anesthesia Post Note  Patient: CECILIA NISHIKAWA  Procedure(s) Performed: ATRIAL FIBRILLATION ABLATION     Patient location during evaluation: PACU Anesthesia Type: General Level of consciousness: awake and alert Pain management: pain level controlled Vital Signs Assessment: post-procedure vital signs reviewed and stable Respiratory status: spontaneous breathing, nonlabored ventilation, respiratory function stable and patient connected to nasal cannula oxygen Cardiovascular status: blood pressure returned to baseline and stable Postop Assessment: no apparent nausea or vomiting Anesthetic complications: no   No notable events documented.  Last Vitals:  Vitals:   09/09/23 1230 09/09/23 1300  BP: (!) 156/83 (!) 161/83  Pulse: (!) 59 63  Resp: 13 17  Temp:    SpO2: 97% 96%    Last Pain:  Vitals:   09/09/23 1030  TempSrc:   PainSc: 0-No pain                 Kennieth Rad

## 2023-09-10 NOTE — Telephone Encounter (Signed)
 Left voice mail for patient to reschedule 3/25 appointment with Nicolasa Ducking, NP to July 2025. Per Nicolasa Ducking, NP patient doesn't need appointment for 3/25 since he had a procedure(ablation) yesterday. He will keep the April appointment with Sherie Don, NP and reschedule the Nicolasa Ducking, NP appointment to July 2025

## 2023-09-15 ENCOUNTER — Ambulatory Visit: Payer: PPO | Admitting: Nurse Practitioner

## 2023-09-24 ENCOUNTER — Telehealth: Payer: Self-pay

## 2023-09-24 NOTE — Telephone Encounter (Signed)
 Called to schedule Watchman procedure. The patient reported he does not wish to schedule at this time. He will have his follow-up later this month and call if he wishes to schedule. He was grateful for call and agreed with plan.

## 2023-09-29 ENCOUNTER — Ambulatory Visit (INDEPENDENT_AMBULATORY_CARE_PROVIDER_SITE_OTHER)

## 2023-09-29 VITALS — BP 161/83 | Ht 74.0 in | Wt 195.0 lb

## 2023-09-29 DIAGNOSIS — Z0000 Encounter for general adult medical examination without abnormal findings: Secondary | ICD-10-CM | POA: Diagnosis not present

## 2023-09-29 NOTE — Progress Notes (Signed)
 Because this visit was a virtual/telehealth visit,  certain criteria was not obtained, such a blood pressure, CBG if applicable, and timed get up and go. Any medications not marked as "taking" were not mentioned during the medication reconciliation part of the visit. Any vitals not documented were not able to be obtained due to this being a telehealth visit or patient was unable to self-report a recent blood pressure reading due to a lack of equipment at home via telehealth. Vitals that have been documented are verbally provided by the patient.   Subjective:   Craig Calhoun is a 73 y.o. who presents for a Medicare Wellness preventive visit.  Visit Complete: Virtual I connected with  [name] on 09/29/23 by a audio enabled telemedicine application and verified that I am speaking with the correct person using two identifiers.  Patient Location: Home  Provider Location: Home Office  I discussed the limitations of evaluation and management by telemedicine. The patient expressed understanding and agreed to proceed.  Vital Signs: Because this visit was a virtual/telehealth visit, some criteria may be missing or patient reported. Any vitals not documented were not able to be obtained and vitals that have been documented are patient reported.  VideoDeclined- This patient declined Librarian, academic. Therefore the visit was completed with audio only.  Persons Participating in Visit: Patient.  AWV Questionnaire: No: Patient Medicare AWV questionnaire was not completed prior to this visit.  Cardiac Risk Factors include: advanced age (>28men, >59 women);male gender;hypertension     Objective:    Today's Vitals   09/29/23 1549 09/29/23 1550  BP: (!) 161/83   Weight: 195 lb (88.5 kg)   Height: 6\' 2"  (1.88 m)   PainSc:  0-No pain   Body mass index is 25.04 kg/m.     09/29/2023    4:01 PM 09/09/2023    7:59 AM 07/31/2021   10:02 AM 02/07/2019    7:35 AM  09/12/2016    9:18 AM 05/02/2016    1:26 PM  Advanced Directives  Does Patient Have a Medical Advance Directive? No No No No No No  Would patient like information on creating a medical advance directive? No - Patient declined No - Patient declined  No - Patient declined Yes (MAU/Ambulatory/Procedural Areas - Information given)     Current Medications (verified) Outpatient Encounter Medications as of 09/29/2023  Medication Sig   carvedilol (COREG) 3.125 MG tablet Take 1 tablet (3.125 mg total) by mouth 2 (two) times daily with a meal.   diphenhydramine-acetaminophen (TYLENOL PM) 25-500 MG TABS tablet Take 1 tablet by mouth at bedtime as needed (sleep).   ELIQUIS 5 MG TABS tablet TAKE ONE TABLET TWICE DAILY   pantoprazole (PROTONIX) 40 MG tablet Take 1 tablet (40 mg total) by mouth daily.   sacubitril-valsartan (ENTRESTO) 49-51 MG Take 1 tablet by mouth 2 (two) times daily.   colchicine 0.6 MG tablet Take 1 tablet (0.6 mg total) by mouth 2 (two) times daily for 5 days.   No facility-administered encounter medications on file as of 09/29/2023.    Allergies (verified) Lisinopril   History: Past Medical History:  Diagnosis Date   Acquired dilation of ascending aorta and aortic root (HCC)    a. 02/2023 Echo: Asc Ao 42mm, Ao root 37mm.   CAD (coronary artery disease)    a. 03/26/2023 Cor CTA: Ca2+ 165 (48 percentile), 25-49% stenosis within the LAD and mid RCA, and less than 25% stenosis in the proximal circumflex.  GERD (gastroesophageal reflux disease)    Hepatitis C    Rx interferon/ribaviron 2004-'05 (recurred after Rx), treated and cleared with Harvoni 2016   Hypertension    NICM (nonischemic cardiomyopathy) (HCC)    a. Presumed to be 2/2 afib-->01/2019 Echo: EF 40-50%; b. 02/2019 Echo: EF 50-55% (sinus rhythm); c. 02/2023 Echo: EF 40-45%, glob HK, low-nl RV fxn, mildly dil LA, mod MR, Asc Ao 42mm, Ao root 37mm.   Persistent atrial fibrillation (HCC)    a. 01/2019 s/p DCCV (200J); b. 02/2023  s/p DCCV (150J - biphasic).   Past Surgical History:  Procedure Laterality Date   APPENDECTOMY     ATRIAL FIBRILLATION ABLATION N/A 09/09/2023   Procedure: ATRIAL FIBRILLATION ABLATION;  Surgeon: Lanier Prude, MD;  Location: [facility];  Service: Cardiovascular;  Laterality: N/A;   CARDIOVERSION N/A 02/07/2019   Procedure: CARDIOVERSION;  Surgeon: [name], MD;  Location: [facility] ORS;  Service: Cardiovascular;  Laterality: N/A;   CARDIOVERSION N/A 03/10/2023   Procedure: CARDIOVERSION;  Surgeon: Antonieta Iba, MD;  Location: [facility] ORS;  Service: Cardiovascular;  Laterality: N/A;   COLONOSCOPY  09/2010   1 polyp-tubular adenoma   COLONOSCOPY N/A 05/02/2016   Procedure: COLONOSCOPY;  Surgeon: [name], MD;  Location: [facility] ENDOSCOPY;  Service: Endoscopy;  Laterality: N/A;   COLONOSCOPY WITH PROPOFOL N/A 07/31/2021   Procedure: COLONOSCOPY WITH PROPOFOL;  Surgeon: [name], MD;  Location: [facility] ENDOSCOPY;  Service: Endoscopy;  Laterality: N/A;   LIVER BIOPSY     LUMBAR LAMINECTOMY     MENISCUS REPAIR Left 4/14   Family History  Problem Relation Age of Onset   Diabetes Mother    Heart disease Father    Breast cancer Sister    Colon cancer Maternal Aunt    Hypertension Neg Hx    Cancer Neg Hx        colon or prostate cancer   Social History   Socioeconomic History   Marital status: Married    Spouse name: Not on file   Number of children: 3   Years of education: Not on file   Highest education level: Not on file  Occupational History   Occupation: Games developer, has farm    Comment: part time now  Tobacco Use   Smoking status: Some Days    Current packs/day: 0.00    Types: Cigarettes    Last attempt to quit: 07/16/2015    Years since quitting: 8.2    Passive exposure: Past   Smokeless tobacco: Never   Tobacco comments:    3-4 cigarettes per day  Vaping Use   Vaping status: Never Used  Substance and Sexual Activity   Alcohol  use: Yes    Alcohol/week: 1.0 standard drink of alcohol    Types: 1 Cans of beer per week    Comment: occassionally   Drug use: No   Sexual activity: Not on file  Other Topics Concern   Not on file  Social History Narrative   No living will   No formal health care POA--would want wife, then daughter   Would accept resuscitation attempts   Not sure about tube feeds      Lives with wife, Darl Pikes.  No indoor pets.    Social Drivers of Corporate investment banker Strain: Low Risk  (09/29/2023)   Overall Financial Resource Strain (CARDIA)    Difficulty of Paying Living Expenses: Not hard at all  Food Insecurity: No Food Insecurity (09/29/2023)  Hunger Vital Sign    Worried About Running Out of Food in the Last Year: Never true    Ran Out of Food in the Last Year: Never true  Transportation Needs: No Transportation Needs (09/29/2023)   PRAPARE - Administrator, Civil Service (Medical): No    Lack of Transportation (Non-Medical): No  Physical Activity: Insufficiently Active (09/29/2023)   Exercise Vital Sign    Days of Exercise per Week: 3 days    Minutes of Exercise per Session: 20 min  Stress: No Stress Concern Present (09/29/2023)   [employer] of Occupational Health - Occupational Stress Questionnaire    Feeling of Stress : Not at all  Social Connections: Unknown (09/29/2023)   Social Connection and Isolation Panel [NHANES]    Frequency of Communication with Friends and Family: More than three times a week    Frequency of Social Gatherings with Friends and Family: More than three times a week    Attends Religious Services: More than 4 times per year    Active Member of [employer] or Organizations: Yes    Attends Banker Meetings: Never    Marital Status: Patient unable to answer    Tobacco Counseling Ready to quit: Not Answered Counseling given: Not Answered Tobacco comments: 3-4 cigarettes per day    Clinical Intake:  Pre-visit preparation completed:  Yes  Pain : No/denies pain Pain Score: 0-No pain     BMI - recorded: 25.04 Nutritional Status: BMI 25 -29 Overweight Nutritional Risks: None Diabetes: No  No results found for: "HGBA1C"   How often do you need to have someone help you when you read instructions, pamphlets, or other written materials from your doctor or pharmacy?: 1 - Never What is the last grade level you completed in school?: 12th grade  Interpreter Needed?: No  Information entered by :: Solei Wubben,CMA   Activities of Daily Living     09/29/2023    3:54 PM  In your present state of health, do you have any difficulty performing the following activities:  Hearing? 0  Vision? 0  Difficulty concentrating or making decisions? 0  Walking or climbing stairs? 0  Dressing or bathing? 0  Doing errands, shopping? 0  Preparing Food and eating ? N  Using the Toilet? N  In the past six months, have you accidently leaked urine? N  Do you have problems with loss of bowel control? N  Managing your Medications? N  Managing your Finances? N  Housekeeping or managing your Housekeeping? N    Patient Care Team: [name], MD as PCP - General [name], MD as PCP - Cardiology (Cardiology) Lanier Prude, MD as PCP - Electrophysiology (Cardiology) [name], MD as Referring Physician (Internal Medicine) Lemar Livings Merrily Pew, MD (General Surgery)  Indicate any recent Medical Services you may have received from other than [facility] providers in the past year (date may be approximate).     Assessment:   This is a routine wellness examination for Reymond.  Hearing/Vision screen Hearing Screening - Comments:: Patient has hearing aids Vision Screening - Comments:: Patient wears readerd   Goals Addressed             This Visit's Progress    Patient Stated       Healthier life       Depression Screen     09/29/2023    4:01 PM 03/24/2022   10:27 AM 03/05/2021    8:25 AM 03/01/2020  3:54 PM 02/22/2019    9:55 AM 02/18/2018    4:29 PM 09/12/2016    9:34 AM  PHQ 2/9 Scores  PHQ - 2 Score 0 0 0 0 0 0 0  PHQ- 9 Score 0          Fall Risk     09/29/2023    3:55 PM 03/24/2022   10:26 AM 03/05/2021    8:25 AM 03/01/2020    3:54 PM 01/17/2020    3:29 PM  Fall Risk   Falls in the past year? 0 0 0 0 0  Comment     Emmi Telephone Survey: data to providers prior to load  Number falls in past yr: 0      Injury with Fall? 0      Risk for fall due to : No Fall Risks      Follow up Falls prevention discussed;Falls evaluation completed        MEDICARE RISK AT HOME:  Medicare Risk at Home Any stairs in or around the home?: Yes If so, are there any without handrails?: No Home free of loose throw rugs in walkways, pet beds, electrical cords, etc?: Yes Adequate lighting in your home to reduce risk of falls?: Yes Life alert?: No Use of a cane, walker or w/c?: No Grab bars in the bathroom?: No Shower chair or bench in shower?: Yes Elevated toilet seat or a handicapped toilet?: Yes  TIMED UP AND GO:  Was the test performed?  No  Cognitive Function: 6CIT completed        09/29/2023    3:51 PM  6CIT Screen  What Year? 0 points  What month? 0 points  What time? 0 points  Count back from 20 0 points  Months in reverse 0 points  Repeat phrase 4 points  Total Score 4 points    Immunizations Immunization History  Administered Date(s) Administered   Fluad Quad(high Dose 65+) 03/01/2020, 03/05/2021, 03/24/2022   Hepatitis B 09/30/2013, 11/01/2013   Influenza Split 03/19/2011   Influenza Whole 05/08/2010   Influenza, High Dose Seasonal PF 03/23/2019   Influenza, Seasonal, Injecte, Preservative Fre 03/24/2015, 03/23/2016   Influenza,inj,Quad PF,6+ Mos 04/13/2018   PFIZER(Purple Top)SARS-COV-2 Vaccination 08/15/2019, 09/06/2019   Pneumococcal Conjugate-13 09/12/2016   Pneumococcal Polysaccharide-23 02/18/2018   Td 04/04/2004   Tdap 11/07/2013, 10/27/2018   Zoster, Live  05/07/2011    Screening Tests Health Maintenance  Topic Date Due   Zoster Vaccines- Shingrix (1 of 2) 10/13/1969   INFLUENZA VACCINE  01/22/2024   Medicare Annual Wellness (AWV)  09/28/2024   Colonoscopy  07/31/2026   DTaP/Tdap/Td (4 - Td or Tdap) 10/26/2028   Pneumonia Vaccine 64+ Years old  Completed   Hepatitis C Screening  Completed   HPV VACCINES  Aged Out   COVID-19 Vaccine  Discontinued    Health Maintenance  Health Maintenance Due  Topic Date Due   Zoster Vaccines- Shingrix (1 of 2) 10/13/1969   Health Maintenance Items Addressed:declined shingles vaccine  Additional Screening:  Vision Screening: Recommended annual ophthalmology exams for early detection of glaucoma and other disorders of the eye.  Dental Screening: Recommended annual dental exams for proper oral hygiene  Community Resource Referral / Chronic Care Management: CRR required this visit?  No   CCM required this visit?  No     Plan:     I have personally reviewed and noted the following in the patient's chart:   Medical and social history Use of alcohol, tobacco or  illicit drugs  Current medications and supplements including opioid prescriptions. Patient is not currently taking opioid prescriptions. Functional ability and status Nutritional status Physical activity Advanced directives List of other physicians Hospitalizations, surgeries, and ER visits in previous 12 months Vitals Screenings to include cognitive, depression, and falls Referrals and appointments  In addition, I have reviewed and discussed with patient certain preventive protocols, quality metrics, and best practice recommendations. A written personalized care plan for preventive services as well as general preventive health recommendations were provided to patient.     Rudi Heap, [place]   09/29/2023   After Visit Summary: (MyChart) Due to this being a telephonic visit, the after visit summary with patients  personalized plan was offered to patient via MyChart   Notes: Nothing significant to report at this time.

## 2023-10-06 ENCOUNTER — Encounter: Payer: Self-pay | Admitting: Cardiology

## 2023-10-06 ENCOUNTER — Ambulatory Visit: Attending: Cardiology | Admitting: Cardiology

## 2023-10-06 VITALS — BP 158/80 | HR 68 | Resp 17 | Ht 74.0 in | Wt 194.0 lb

## 2023-10-06 DIAGNOSIS — I10 Essential (primary) hypertension: Secondary | ICD-10-CM

## 2023-10-06 DIAGNOSIS — K7469 Other cirrhosis of liver: Secondary | ICD-10-CM | POA: Diagnosis not present

## 2023-10-06 DIAGNOSIS — I5022 Chronic systolic (congestive) heart failure: Secondary | ICD-10-CM

## 2023-10-06 DIAGNOSIS — D6869 Other thrombophilia: Secondary | ICD-10-CM | POA: Diagnosis not present

## 2023-10-06 DIAGNOSIS — I4819 Other persistent atrial fibrillation: Secondary | ICD-10-CM | POA: Diagnosis not present

## 2023-10-06 NOTE — Patient Instructions (Signed)
 Medication Instructions:  The current medical regimen is effective;  continue present plan and medications as directed. Please refer to the Current Medication list given to you today.   *If you need a refill on your cardiac medications before your next appointment, please call your pharmacy*   Follow-Up: At [facility], you and your health needs are our priority.  As part of our continuing mission to provide you with exceptional heart care, our providers are all part of one team.  This team includes your primary Cardiologist (physician) and Advanced Practice Providers or APPs (Physician Assistants and Nurse Practitioners) who all work together to provide you with the care you need, when you need it.  Your next appointment:   Keep scheduled appointment    We recommend signing up for the patient portal called "MyChart".  Sign up information is provided on this After Visit Summary.  MyChart is used to connect with patients for Virtual Visits (Telemedicine).  Patients are able to view lab/test results, encounter notes, upcoming appointments, etc.  Non-urgent messages can be sent to your provider as well.   To learn more about what you can do with MyChart, go to [email or web address].   Other Instructions Take blood pressure at home (use log provided) bring back with you to follow up appointment.

## 2023-10-06 NOTE — Progress Notes (Signed)
 Electrophysiology Clinic Note    Date:  10/06/2023  Patient ID:  Craig Calhoun, Craig Calhoun Aug 04, 1950, MRN [id number] PCP:  [name], MD  Cardiologist:  [name], MD Electrophysiologist: Lanier Prude, MD    Discussed the use of AI scribe software for clinical note transcription with the patient, who gave verbal consent to proceed.   Patient Profile    Chief Complaint: AF ablation follow-up  History of Present Illness: Craig Calhoun is a 73 y.o. male with PMH notable for persis Afib, HFmrEF, dilated ascending aorta, HTN, hep C (treated), tobacco use, ; seen today for Lanier Prude, MD for routine electrophysiology followup.   He is s/p AF ablation w PVI, posterior wall 09/09/2023.   On follow-up today, he is not aware of any AFib episodes but does continue to have DOE. He remains very active, working full time and taking care of garden and property. He notices DOE with walking trash can to the street and with strenuous garden activities. Is otherwise without activity limitations. No chest pain, chest pressure, palpitations, diaphoresis with activities that cause DOE. He continues to take eliquis BID,  no bleeding concerns.  His groin sites have completely healed. He denies bruising, swelling, or pain.  He checks BP at home, believes most readings are 100-120 systolic, but did not bring BP log today.  He is interested in pursuing watchman device that was previously recommended.    Arrhythmia/Device History No specialty comments available.    ROS:  Please see the history of present illness. All other systems are reviewed and otherwise negative.    Physical Exam    VS:  BP (!) 158/80 (BP Location: Left Arm, Patient Position: Sitting, Cuff Size: Normal)   Pulse 68   Resp 17   Ht 6\' 2"  (1.88 m)   Wt 194 lb (88 kg)   SpO2 95%   BMI 24.91 kg/m  BMI: Body mass index is 24.91 kg/m.  Wt Readings from Last 3 Encounters:  10/06/23 194 lb (88 kg)   09/29/23 195 lb (88.5 kg)  09/09/23 190 lb (86.2 kg)     GEN- The patient is well appearing, alert and oriented x 3 today.   Lungs- Clear to ausculation bilaterally, normal work of breathing.  Heart- Regular rate and rhythm, no murmurs, rubs or gallops Extremities- No peripheral edema, warm, dry    Studies Reviewed   Previous EP, cardiology notes.    EKG is ordered. Personal review of EKG from today shows:    EKG Interpretation Date/Time:  Tuesday October 06 2023 13:06:32 EDT Ventricular Rate:  65 PR Interval:  176 QRS Duration:  86 QT Interval:  402 QTC Calculation: 418 R Axis:   67  Text Interpretation: Normal sinus rhythm Normal ECG Confirmed by Sherie Don [phone]) on 10/06/2023 1:10:14 PM     Cardiac CT, 08/24/2023 1.  No LAA thrombus mild bi atrial enlargement  2. Chicken wing appendage with landing zone measurements above suitable for a 27 mm Watchman FLX device  3.  Dilated ascending thoracic aorta 4.0 cm  4.  No pericardial effusion  5.  Calcium score 156 which is 47 th percentile for age/sex  6.  No ASD/PFO  7.  Normal PV anatomy see measurements above  TTE, 03/03/2023  1. Left ventricular ejection fraction, by estimation, is 40 to 45%. Left ventricular ejection fraction by 3D volume is 39 %. The left ventricle has mildly decreased function. The left ventricle demonstrates global hypokinesis.  Left ventricular diastolic  parameters are indeterminate. The average left ventricular global  longitudinal strain is -11.2 %.   2. Right ventricular systolic function is low normal. The right ventricular size is mildly enlarged.   3. Left atrial size was mildly dilated.   4. The mitral valve is normal in structure. Moderate mitral valve regurgitation. No evidence of mitral stenosis.   5. The aortic valve is tricuspid. Aortic valve regurgitation is not visualized. Aortic valve sclerosis is present, with no evidence of aortic valve stenosis.   6. There is mild dilatation of the  ascending aorta, measuring 42 mm. There is borderline dilatation of the aortic root, measuring 37 mm.   7. The inferior vena cava is normal in size with greater than 50% respiratory variability, suggesting right atrial pressure of 3 mmHg.    Assessment and Plan     #) persis Afib S/p AF ablation 08/2023 Maintaining sinus rhythm today Consider home monitoring device like [employer] to monitor rhythm with ongoing DOE Continue   #) Hypercoag d/t persis afib #) cirrhosis Previously discussed watchman procedure w MD, who recommended proceeding d/t elevated bleeding risk with cirrhosis. Will message RN coordinator to move forward with LAAO procedure  CHA2DS2-VASc Score = at least 3 [CHF History: 1, HTN History: 1, Diabetes History: 0, Stroke History: 0, Vascular Disease History: 0, Age Score: 1, Gender Score: 0].  Therefore, the patient's annual risk of stroke is 3.2 %.    Stroke ppx - 5mg  eliquis, appropriately dosed No bleeding concerns currently   #) HFmrEF #) HTN Likely tachy-mediated NYHA II, warm and dry on exam Continue 49-51 entresto BID, 3.125 coreg BID Recommended he check BP regualrly at home and bring BP log to follow-up appts Update TTE at follow-up to reassess LVEF       Current medicines are reviewed at length with the patient today.   The patient does not have concerns regarding his medicines.  The following changes were made today:  none  Labs/ tests ordered today include:  Orders Placed This Encounter  Procedures   EKG 12-Lead     Disposition: Follow up with Dr. Marven Slimmer or EP APP  in 2 months , or sooner depending on watchman eval    Signed, Tarun Patchell, NP  10/06/23  4:36 PM  Electrophysiology [facility]

## 2023-10-08 ENCOUNTER — Telehealth: Payer: Self-pay

## 2023-10-08 DIAGNOSIS — I4819 Other persistent atrial fibrillation: Secondary | ICD-10-CM

## 2023-10-08 NOTE — Telephone Encounter (Signed)
 Per Adaline Holly, the patient now wants to proceed with LAAO (he did not per 09/24/2023 phone note). Offered him 5/15 for procedure date. He requested a call back Monday to confirm.

## 2023-10-13 ENCOUNTER — Other Ambulatory Visit: Payer: Self-pay

## 2023-10-13 DIAGNOSIS — I4819 Other persistent atrial fibrillation: Secondary | ICD-10-CM

## 2023-10-13 NOTE — Addendum Note (Signed)
 Addended by: Verble Styron A on: 10/13/2023 03:02 PM   Modules accepted: Orders

## 2023-10-13 NOTE — Telephone Encounter (Signed)
 The patient wishes to proceed with LAAO on 11/05/2023. He will get labs drawn in [place] early next week. Will send instructions via MyChart and he understands he will be called to review instructions closer to procedure date. He was grateful for call and agreed with plan.

## 2023-10-13 NOTE — Telephone Encounter (Signed)
 Left message to call back

## 2023-10-15 DIAGNOSIS — S61432A Puncture wound without foreign body of left hand, initial encounter: Secondary | ICD-10-CM | POA: Diagnosis not present

## 2023-10-15 DIAGNOSIS — M79642 Pain in left hand: Secondary | ICD-10-CM | POA: Diagnosis not present

## 2023-10-15 DIAGNOSIS — X58XXXA Exposure to other specified factors, initial encounter: Secondary | ICD-10-CM | POA: Diagnosis not present

## 2023-10-15 DIAGNOSIS — L089 Local infection of the skin and subcutaneous tissue, unspecified: Secondary | ICD-10-CM | POA: Diagnosis not present

## 2023-10-22 ENCOUNTER — Telehealth: Payer: Self-pay

## 2023-10-22 DIAGNOSIS — I4819 Other persistent atrial fibrillation: Secondary | ICD-10-CM | POA: Diagnosis not present

## 2023-10-22 LAB — CBC WITH DIFFERENTIAL/PLATELET
Basophils Absolute: 0.1 10*3/uL (ref 0.0–0.2)
Basos: 1 %
EOS (ABSOLUTE): 0.2 10*3/uL (ref 0.0–0.4)
Eos: 3 %
Hematocrit: 44.8 % (ref 37.5–51.0)
Hemoglobin: 14.4 g/dL (ref 13.0–17.7)
Immature Grans (Abs): 0 10*3/uL (ref 0.0–0.1)
Immature Granulocytes: 0 %
Lymphocytes Absolute: 1.8 10*3/uL (ref 0.7–3.1)
Lymphs: 34 %
MCH: 30.2 pg (ref 26.6–33.0)
MCHC: 32.1 g/dL (ref 31.5–35.7)
MCV: 94 fL (ref 79–97)
Monocytes Absolute: 0.6 10*3/uL (ref 0.1–0.9)
Monocytes: 11 %
Neutrophils Absolute: 2.6 10*3/uL (ref 1.4–7.0)
Neutrophils: 51 %
Platelets: 264 10*3/uL (ref 150–450)
RBC: 4.77 x10E6/uL (ref 4.14–5.80)
RDW: 13 % (ref 11.6–15.4)
WBC: 5.2 10*3/uL (ref 3.4–10.8)

## 2023-10-22 NOTE — Telephone Encounter (Signed)
 Craig Calhoun

## 2023-10-23 LAB — BASIC METABOLIC PANEL WITH GFR
BUN/Creatinine Ratio: 15 (ref 10–24)
BUN: 17 mg/dL (ref 8–27)
CO2: 21 mmol/L (ref 20–29)
Calcium: 9.2 mg/dL (ref 8.6–10.2)
Chloride: 104 mmol/L (ref 96–106)
Creatinine, Ser: 1.1 mg/dL (ref 0.76–1.27)
Glucose: 133 mg/dL — ABNORMAL HIGH (ref 70–99)
Potassium: 4.8 mmol/L (ref 3.5–5.2)
Sodium: 140 mmol/L (ref 134–144)
eGFR: 71 mL/min/{1.73_m2} (ref >59)

## 2023-11-03 ENCOUNTER — Encounter (HOSPITAL_COMMUNITY): Payer: Self-pay | Admitting: Cardiology

## 2023-11-04 ENCOUNTER — Telehealth: Payer: Self-pay

## 2023-11-04 NOTE — Telephone Encounter (Signed)
 Confirmed procedure date of 11/05/2023. Confirmed arrival time of 0530 for procedure time at 0730. Reviewed pre-procedure instructions with patient. Confirmed he has no contrast allergy and no PPM/ICD. The patient understands to call if questions/concerns arise prior to procedure.  He was grateful for call and agreed with plan.

## 2023-11-05 ENCOUNTER — Inpatient Hospital Stay (HOSPITAL_COMMUNITY)

## 2023-11-05 ENCOUNTER — Inpatient Hospital Stay (HOSPITAL_COMMUNITY): Payer: Self-pay

## 2023-11-05 ENCOUNTER — Other Ambulatory Visit: Payer: Self-pay

## 2023-11-05 ENCOUNTER — Encounter (HOSPITAL_COMMUNITY)
Admission: RE | Disposition: A | Discharge status: Home / Self Care | Payer: Self-pay | Source: Home / Self Care | Attending: Cardiology

## 2023-11-05 ENCOUNTER — Encounter (HOSPITAL_COMMUNITY): Payer: Self-pay | Admitting: Cardiology

## 2023-11-05 ENCOUNTER — Inpatient Hospital Stay (HOSPITAL_COMMUNITY)
Admission: RE | Admit: 2023-11-05 | Discharge: 2023-11-05 | DRG: 274 | Disposition: A | Discharge status: Home / Self Care | Attending: Cardiology | Admitting: Cardiology

## 2023-11-05 DIAGNOSIS — Z7901 Long term (current) use of anticoagulants: Secondary | ICD-10-CM | POA: Diagnosis not present

## 2023-11-05 DIAGNOSIS — I4891 Unspecified atrial fibrillation: Secondary | ICD-10-CM

## 2023-11-05 DIAGNOSIS — I700 Atherosclerosis of aorta: Secondary | ICD-10-CM | POA: Diagnosis present

## 2023-11-05 DIAGNOSIS — Z01818 Encounter for other preprocedural examination: Secondary | ICD-10-CM | POA: Diagnosis not present

## 2023-11-05 DIAGNOSIS — Z006 Encounter for examination for normal comparison and control in clinical research program: Secondary | ICD-10-CM

## 2023-11-05 DIAGNOSIS — I4819 Other persistent atrial fibrillation: Principal | ICD-10-CM | POA: Diagnosis present

## 2023-11-05 DIAGNOSIS — I2510 Atherosclerotic heart disease of native coronary artery without angina pectoris: Secondary | ICD-10-CM | POA: Diagnosis not present

## 2023-11-05 DIAGNOSIS — Z87891 Personal history of nicotine dependence: Secondary | ICD-10-CM

## 2023-11-05 DIAGNOSIS — Z95818 Presence of other cardiac implants and grafts: Secondary | ICD-10-CM | POA: Insufficient documentation

## 2023-11-05 DIAGNOSIS — I5022 Chronic systolic (congestive) heart failure: Secondary | ICD-10-CM | POA: Diagnosis present

## 2023-11-05 DIAGNOSIS — I77810 Thoracic aortic ectasia: Secondary | ICD-10-CM | POA: Diagnosis present

## 2023-11-05 DIAGNOSIS — Z8619 Personal history of other infectious and parasitic diseases: Secondary | ICD-10-CM | POA: Diagnosis not present

## 2023-11-05 DIAGNOSIS — K7469 Other cirrhosis of liver: Secondary | ICD-10-CM | POA: Diagnosis present

## 2023-11-05 DIAGNOSIS — K219 Gastro-esophageal reflux disease without esophagitis: Secondary | ICD-10-CM | POA: Diagnosis present

## 2023-11-05 DIAGNOSIS — R918 Other nonspecific abnormal finding of lung field: Secondary | ICD-10-CM | POA: Diagnosis not present

## 2023-11-05 DIAGNOSIS — Z7902 Long term (current) use of antithrombotics/antiplatelets: Secondary | ICD-10-CM

## 2023-11-05 DIAGNOSIS — I110 Hypertensive heart disease with heart failure: Secondary | ICD-10-CM | POA: Diagnosis present

## 2023-11-05 DIAGNOSIS — I10 Essential (primary) hypertension: Secondary | ICD-10-CM | POA: Diagnosis not present

## 2023-11-05 HISTORY — PX: LEFT ATRIAL APPENDAGE OCCLUSION: EP1229

## 2023-11-05 HISTORY — PX: TRANSESOPHAGEAL ECHOCARDIOGRAM (CATH LAB): EP1270

## 2023-11-05 HISTORY — DX: Unspecified osteoarthritis, unspecified site: M19.90

## 2023-11-05 LAB — ABO/RH: ABO/RH(D): A POS

## 2023-11-05 LAB — COMPREHENSIVE METABOLIC PANEL WITH GFR
ALT: 14 U/L (ref 0–44)
AST: 23 U/L (ref 15–41)
Albumin: 4 g/dL (ref 3.5–5.0)
Alkaline Phosphatase: 49 U/L (ref 38–126)
Anion gap: 10 (ref 5–15)
BUN: 18 mg/dL (ref 8–23)
CO2: 26 mmol/L (ref 22–32)
Calcium: 9.5 mg/dL (ref 8.9–10.3)
Chloride: 106 mmol/L (ref 98–111)
Creatinine, Ser: 0.93 mg/dL (ref 0.61–1.24)
GFR, Estimated: >60 mL/min (ref >60)
Glucose, Bld: 106 mg/dL — ABNORMAL HIGH (ref 70–99)
Potassium: 4.1 mmol/L (ref 3.5–5.1)
Sodium: 142 mmol/L (ref 135–145)
Total Bilirubin: 0.6 mg/dL (ref 0.0–1.2)
Total Protein: 7.7 g/dL (ref 6.5–8.1)

## 2023-11-05 LAB — TYPE AND SCREEN
ABO/RH(D): A POS
Antibody Screen: NEGATIVE

## 2023-11-05 LAB — POCT ACTIVATED CLOTTING TIME: Activated Clotting Time: 297 s

## 2023-11-05 LAB — SURGICAL PCR SCREEN
MRSA, PCR: NEGATIVE
Staphylococcus aureus: NEGATIVE

## 2023-11-05 LAB — ECHO TEE

## 2023-11-05 MED ORDER — IOHEXOL 350 MG/ML SOLN
INTRAVENOUS | Status: DC | PRN
  Administered 2023-11-05 (×2): 10 mL

## 2023-11-05 MED ORDER — PROTAMINE SULFATE 10 MG/ML IV SOLN
INTRAVENOUS | Status: DC | PRN
  Administered 2023-11-05: 30 mg via INTRAVENOUS

## 2023-11-05 MED ORDER — SODIUM CHLORIDE 0.9 % IV SOLN: INTRAVENOUS | Status: DC

## 2023-11-05 MED ORDER — FENTANYL CITRATE (PF) 100 MCG/2ML IJ SOLN
INTRAMUSCULAR | Status: AC
  Filled 2023-11-05: qty 2

## 2023-11-05 MED ORDER — ACETAMINOPHEN 325 MG PO TABS: 650.0000 mg | ORAL_TABLET | ORAL | Status: DC | PRN

## 2023-11-05 MED ORDER — HEPARIN (PORCINE) IN NACL 2000-0.9 UNIT/L-% IV SOLN
INTRAVENOUS | Status: DC | PRN
Start: 2023-11-05 — End: 2023-11-05
  Administered 2023-11-05: 1000 mL

## 2023-11-05 MED ORDER — SODIUM CHLORIDE 0.9 % IV SOLN: 250.0000 mL | INTRAVENOUS | Status: DC | PRN

## 2023-11-05 MED ORDER — HEPARIN SODIUM (PORCINE) 1000 UNIT/ML IJ SOLN
INTRAMUSCULAR | Status: DC | PRN
  Administered 2023-11-05: 13000 [IU] via INTRAVENOUS
  Administered 2023-11-05: 4000 [IU] via INTRAVENOUS

## 2023-11-05 MED ORDER — MIDAZOLAM HCL 2 MG/2ML IJ SOLN
INTRAMUSCULAR | Status: AC
  Filled 2023-11-05: qty 2

## 2023-11-05 MED ORDER — PHENYLEPHRINE HCL-NACL 20-0.9 MG/250ML-% IV SOLN
INTRAVENOUS | Status: DC | PRN
  Administered 2023-11-05: 50 ug/min via INTRAVENOUS

## 2023-11-05 MED ORDER — DEXAMETHASONE SODIUM PHOSPHATE 10 MG/ML IJ SOLN
INTRAMUSCULAR | Status: DC | PRN
Start: 2023-11-05 — End: 2023-11-05
  Administered 2023-11-05: 10 mg via INTRAVENOUS

## 2023-11-05 MED ORDER — PROPOFOL 10 MG/ML IV BOLUS
INTRAVENOUS | Status: DC | PRN
  Administered 2023-11-05: 200 mg via INTRAVENOUS

## 2023-11-05 MED ORDER — CHLORHEXIDINE GLUCONATE 0.12 % MT SOLN
15.0000 mL | Freq: Once | OROMUCOSAL | Status: AC
  Administered 2023-11-05: 15 mL via OROMUCOSAL

## 2023-11-05 MED ORDER — LIDOCAINE 2% (20 MG/ML) 5 ML SYRINGE
INTRAMUSCULAR | Status: DC | PRN
  Administered 2023-11-05: 60 mg via INTRAVENOUS

## 2023-11-05 MED ORDER — CHLORHEXIDINE GLUCONATE 0.12 % MT SOLN
OROMUCOSAL | Status: AC
  Filled 2023-11-05: qty 15

## 2023-11-05 MED ORDER — ROCURONIUM BROMIDE 10 MG/ML (PF) SYRINGE
PREFILLED_SYRINGE | INTRAVENOUS | Status: DC | PRN
  Administered 2023-11-05: 50 mg via INTRAVENOUS
  Administered 2023-11-05: 10 mg via INTRAVENOUS

## 2023-11-05 MED ORDER — MIDAZOLAM HCL 2 MG/2ML IJ SOLN
INTRAMUSCULAR | Status: DC | PRN
  Administered 2023-11-05: 1 mg via INTRAVENOUS

## 2023-11-05 MED ORDER — FENTANYL CITRATE (PF) 250 MCG/5ML IJ SOLN
INTRAMUSCULAR | Status: DC | PRN
  Administered 2023-11-05 (×2): 50 ug via INTRAVENOUS

## 2023-11-05 MED ORDER — ACETAMINOPHEN 325 MG PO TABS: 650.0000 mg | ORAL_TABLET | ORAL | Status: AC | PRN

## 2023-11-05 MED ORDER — PHENYLEPHRINE 80 MCG/ML (10ML) SYRINGE FOR IV PUSH (FOR BLOOD PRESSURE SUPPORT)
PREFILLED_SYRINGE | INTRAVENOUS | Status: DC | PRN
  Administered 2023-11-05: 240 ug via INTRAVENOUS

## 2023-11-05 MED ORDER — ONDANSETRON HCL 4 MG/2ML IJ SOLN
INTRAMUSCULAR | Status: DC | PRN
  Administered 2023-11-05: 4 mg via INTRAVENOUS

## 2023-11-05 MED ORDER — LACTATED RINGERS IV SOLN: INTRAVENOUS | Status: DC | PRN

## 2023-11-05 MED ORDER — SUGAMMADEX SODIUM 200 MG/2ML IV SOLN
INTRAVENOUS | Status: DC | PRN
  Administered 2023-11-05: 200 mg via INTRAVENOUS

## 2023-11-05 MED ORDER — FENTANYL CITRATE (PF) 100 MCG/2ML IJ SOLN: 25.0000 ug | INTRAMUSCULAR | Status: DC | PRN

## 2023-11-05 MED ORDER — SODIUM CHLORIDE 0.9% FLUSH: 3.0000 mL | INTRAVENOUS | Status: DC | PRN

## 2023-11-05 MED ORDER — APIXABAN 5 MG PO TABS
5.0000 mg | ORAL_TABLET | Freq: Two times a day (BID) | ORAL | Status: DC
  Administered 2023-11-05: 5 mg via ORAL
  Filled 2023-11-05: qty 1

## 2023-11-05 MED ORDER — CEFAZOLIN SODIUM-DEXTROSE 2-4 GM/100ML-% IV SOLN
2.0000 g | INTRAVENOUS | Status: AC
  Administered 2023-11-05: 2 g via INTRAVENOUS
  Filled 2023-11-05: qty 100

## 2023-11-05 MED ORDER — ONDANSETRON HCL 4 MG/2ML IJ SOLN: 4.0000 mg | Freq: Four times a day (QID) | INTRAMUSCULAR | Status: DC | PRN

## 2023-11-05 MED ORDER — SODIUM CHLORIDE 0.9% FLUSH
3.0000 mL | Freq: Two times a day (BID) | INTRAVENOUS | Status: DC
  Administered 2023-11-05: 3 mL via INTRAVENOUS

## 2023-11-05 NOTE — Transfer of Care (Signed)
 Immediate Anesthesia Transfer of Care Note  Patient: Craig Calhoun  Procedure(s) Performed: LEFT ATRIAL APPENDAGE OCCLUSION TRANSESOPHAGEAL ECHOCARDIOGRAM  Patient Location: PACU  Anesthesia Type:General  Level of Consciousness: awake, alert , and patient cooperative  Airway & Oxygen Therapy: Patient Spontanous Breathing and Patient connected to face mask oxygen  Post-op Assessment: Report given to RN and Post -op Vital signs reviewed and stable  Post vital signs: Reviewed and stable  Last Vitals:  Vitals Value Taken Time  BP 159/88 11/05/23 0905  Temp    Pulse 63 11/05/23 0906  Resp 14 11/05/23 0905  SpO2 100 % 11/05/23 0906  Vitals shown include unfiled device data.  Last Pain:  Vitals:   11/05/23 0556  TempSrc:   PainSc: 0-No pain      Patients Stated Pain Goal: 0 (11/05/23 0556)  Complications: There were no known notable events for this encounter.

## 2023-11-05 NOTE — Plan of Care (Incomplete)
 Mr Hoerig walked in the hallway after bedrest restrictions were lifted. He started desating to 84%, no c/o dyspnea or pain and other hemodynamics stable. Attempted to walk a second time with sats ranging from 88-94%. Pilar Bridge PA-C aware. Incentive Spirometer provided with patient to take home.  PIVs removed. Discharge AVS provided and questions answered. Leaving 2C with his wife.  Problem: Education: Goal: Knowledge of cardiac device and self-care will improve Outcome: Adequate for Discharge Goal: Ability to safely manage health related needs after discharge will improve Outcome: Adequate for Discharge Goal: Individualized Educational Video(s) Outcome: Adequate for Discharge   Problem: Cardiac: Goal: Ability to achieve and maintain adequate cardiopulmonary perfusion will improve Outcome: Adequate for Discharge   Problem: Education: Goal: Knowledge of General Education information will improve Description: Including pain rating scale, medication(s)/side effects and non-pharmacologic comfort measures Outcome: Adequate for Discharge   Problem: Health Behavior/Discharge Planning: Goal: Ability to manage health-related needs will improve Outcome: Adequate for Discharge   Problem: Clinical Measurements: Goal: Ability to maintain clinical measurements within normal limits will improve Outcome: Adequate for Discharge Goal: Will remain free from infection Outcome: Adequate for Discharge Goal: Diagnostic test results will improve Outcome: Adequate for Discharge Goal: Respiratory complications will improve Outcome: Adequate for Discharge Goal: Cardiovascular complication will be avoided Outcome: Adequate for Discharge   Problem: Activity: Goal: Risk for activity intolerance will decrease Outcome: Adequate for Discharge   Problem: Nutrition: Goal: Adequate nutrition will be maintained Outcome: Adequate for Discharge   Problem: Coping: Goal: Level of anxiety will  decrease Outcome: Adequate for Discharge   Problem: Elimination: Goal: Will not experience complications related to bowel motility Outcome: Adequate for Discharge Goal: Will not experience complications related to urinary retention Outcome: Adequate for Discharge   Problem: Pain Managment: Goal: General experience of comfort will improve and/or be controlled Outcome: Adequate for Discharge   Problem: Safety: Goal: Ability to remain free from injury will improve Outcome: Adequate for Discharge   Problem: Skin Integrity: Goal: Risk for impaired skin integrity will decrease Outcome: Adequate for Discharge

## 2023-11-05 NOTE — Anesthesia Preprocedure Evaluation (Signed)
 Anesthesia Evaluation  Patient identified by MRN, date of birth, ID band Patient awake    Reviewed: Allergy & Precautions, NPO status , Patient's Chart, lab work & pertinent test results  Airway Mallampati: II  TM Distance: >3 FB Neck ROM: Full    Dental  (+) Dental Advisory Given   Pulmonary former smoker   breath sounds clear to auscultation       Cardiovascular hypertension, Pt. on medications and Pt. on home beta blockers + CAD and + DOE  + dysrhythmias Atrial Fibrillation  Rhythm:Regular Rate:Normal     Neuro/Psych negative neurological ROS     GI/Hepatic ,GERD  ,,(+) Hepatitis -, C  Endo/Other  negative endocrine ROS    Renal/GU negative Renal ROS     Musculoskeletal  (+) Arthritis ,    Abdominal   Peds  Hematology negative hematology ROS (+)   Anesthesia Other Findings   Reproductive/Obstetrics                             Anesthesia Physical Anesthesia Plan  ASA: 3  Anesthesia Plan: General   Post-op Pain Management: Tylenol  PO (pre-op)*   Induction: Intravenous  PONV Risk Score and Plan: 2 and Dexamethasone , Ondansetron  and Treatment may vary due to age or medical condition  Airway Management Planned: Oral ETT  Additional Equipment: ClearSight  Intra-op Plan:   Post-operative Plan: Extubation in OR  Informed Consent: I have reviewed the patients History and Physical, chart, labs and discussed the procedure including the risks, benefits and alternatives for the proposed anesthesia with the patient or authorized representative who has indicated his/her understanding and acceptance.       Plan Discussed with: CRNA  Anesthesia Plan Comments:        Anesthesia Quick Evaluation

## 2023-11-05 NOTE — TOC CM/SW Note (Signed)
 Transition of Care [facility]) - Inpatient Brief Assessment   Patient Details  Name: Craig Calhoun MRN: [id number] Date of Birth: 03/27/1951  [facility]) CM/SW Contact:    Juliane Och, LCSW Phone Number: 11/05/2023, 1:59 PM   Clinical Narrative:  1:59 PM Per chart review, resides at home with spouse. Patient has a PCP and insurance. Patient does not have SNF/HH/DME history. No SDOH needs as of 09/29/2023. No TOC needs were identified at this time. TOC will continue to follow and be available to assist.  Transition of Care Asessment: Insurance and Status: Insurance coverage has been reviewed Patient has primary care physician: Yes Home environment has been reviewed: Private Residence Prior level of function:: N/A Prior/Current Home Services: No current home services Social Drivers of Health Review: SDOH reviewed no interventions necessary (From 09/29/2023) Readmission risk has been reviewed: Yes Transition of care needs: no transition of care needs at this time

## 2023-11-05 NOTE — H&P (Signed)
 Electrophysiology Office Note:     Date:  11/05/2023    ID:  [name], DOB [birth year], MRN [id number]   [facility] Cardiologist:  Antionette Kirks, MD  [facility] Electrophysiologist:  Boyce Byes, MD    Referring MD: Wenona Hamilton, MD    Chief Complaint: Atrial fibrillation   History of Present Illness:     Mr. Craig Calhoun is a 73 year old man who I am seeing today for an evaluation of persistent atrial fibrillation.  His medical history includes persistent atrial fibrillation hypertension, dilated ascending aorta, GERD, hepatitis C (treated), tobacco use and chronic systolic heart failure.  He saw Craig Calhoun in clinic on March 20, 2023.  He had a recent cardioversion for his atrial fibrillation.  After the cardioversion his heart rates were noted to be in the 40s and 50s.  He had shortness of breath while in atrial fibrillation with some improvement back in sinus rhythm.  He is on Eliquis  for stroke prophylaxis.   Discussed the use of AI scribe software for clinical note transcription with the patient, who gave verbal consent to proceed.   History of Present Illness   The patient, with a history of atrial fibrillation (AFib) and reduced heart function, presents for a discussion about potential treatment options. He is currently on Eliquis  for stroke prevention and Entresto  for heart function. He expresses interest in an ablation procedure, as a friend of his had undergone the procedure with successful results. The patient reports that this is the second time he's had AFib and is considering intervention. He also expresses concern about the high cost of his medication, particularly Entresto , which costs him $160 a month. The patient has a history of hepatitis C and cirrhosis which increases his risk of bleeding while on Eliquis .       He is doing well after ablation. Presents for LAAO today. Procedure reviewed.           Objective Their past medical, social and  family history was reveiwed.     ROS:   Please see the history of present illness.    All other systems reviewed and are negative.   EKGs/Labs/Other Studies Reviewed:     The following studies were reviewed today:   April 18, 2023 CTA coronary Mild dilation of the ascending aorta Suspected cirrhosis 40th percentile for coronary artery calcium   March 03, 2023 echo EF 40-45 RV normal/low normal Mildly dilated left atrium          Physical Exam:     VS:  BP 166/98   Pulse 60   Ht 6\' 2"  (1.88 m)   Wt 195 lb (88.5 kg)   SpO2 95%   BMI 25.04 kg/m         Wt Readings from Last 3 Encounters:  05/20/23 195 lb (88.5 kg)  03/20/23 193 lb 4 oz (87.7 kg)  03/10/23 188 lb (85.3 kg)      GEN: well appearing male, no distress CARD: RRR, no IWOB. RESP: No IWOB.       Assessment ASSESSMENT AND PLAN:     1. Persistent atrial fibrillation (HCC)   2. Other cirrhosis of liver (HCC)   3. Aortic atherosclerosis (HCC)   4. Chronic systolic heart failure (HCC)           Assessment and Plan    Atrial Fibrillation Recurrent episodes. Discussed the benefits of early intervention with ablation to prevent scar tissue formation and increase chances of maintaining sinus rhythm. Also  discussed the potential for a Watchman device to provide stroke protection while potentially allowing discontinuation of anticoagulation in the future given his history of cirrhosis and HCV (treated).    -------------   I have seen [name] in the office today who is being considered for a Watchman left atrial appendage closure device. I believe they will benefit from this procedure given their history of atrial fibrillation, CHA2DS2-VASc score of 3 and unadjusted ischemic stroke rate of 3.2% per year. Unfortunately, the patient is not felt to be a long term anticoagulation candidate secondary to increased risk of bleeding associated with cirrhosis and long term anticoagulation use. The  patient's chart has been reviewed and I feel that they would be a candidate for short term oral anticoagulation after Watchman implant.    It is my belief that after undergoing a LAA closure procedure, Craig Calhoun will not need long term anticoagulation which eliminates anticoagulation side effects and major bleeding risk.    Procedural risks for the Watchman implant have been reviewed with the patient including a 0.5% risk of stroke, <1% risk of perforation and <1% risk of device embolization. Other risks include bleeding, vascular damage, tamponade, worsening renal function, and death. The patient understands these risk and wishes to proceed.       The published clinical data on the safety and effectiveness of WATCHMAN include but are not limited to the following: - Holmes DR, Evalene Hilda, Sick P et al. for the PROTECT AF Investigators. Percutaneous closure of the left atrial appendage versus warfarin therapy for prevention of stroke in patients with atrial fibrillation: a randomised non-inferiority trial. Lancet 2009; 374: 534-42. Evalene Hilda, Doshi SK, Deloria Fetch D et al. on behalf of the PROTECT AF Investigators. Percutaneous Left Atrial Appendage Closure for Stroke Prophylaxis in Patients With Atrial Fibrillation 2.3-Year Follow-up of the PROTECT AF (Watchman Left Atrial Appendage System for Embolic Protection in Patients With Atrial Fibrillation) Trial. Circulation 2013; 127:720-729. - Alli O, Doshi S,  Kar S, Reddy VY, Sievert H et al. Quality of Life Assessment in the Randomized PROTECT AF (Percutaneous Closure of the Left Atrial Appendage Versus Warfarin Therapy for Prevention of Stroke in Patients With Atrial Fibrillation) Trial of Patients at Risk for Stroke With Nonvalvular Atrial Fibrillation. J Am Coll Cardiol 2013; 61:1790-8. Bartholome Ligas DR, Mario Sicard, Price M, Whisenant B, Sievert H, Doshi S, Huber K, Reddy V. Prospective randomized evaluation of the Watchman left atrial appendage Device  in patients with atrial fibrillation versus long-term warfarin therapy; the PREVAIL trial. Journal of the [employer] of Cardiology, Vol. 4, No. 1, 2014, 1-11. - Kar S, Doshi SK, Sadhu A, Horton R, Osorio J et al. Primary outcome evaluation of a next-generation left atrial appendage closure device: results from the PINNACLE FLX trial. Circulation 2021;143(18)1754-1762.      After today's visit with the patient which was dedicated solely for shared decision making visit regarding LAA closure device, the patient decided to proceed with the LAA appendage closure procedure scheduled to be done in the near future at [facility]. Prior to the procedure, I would like to obtain a gated CT scan of the chest with contrast timed for PV/LA visualization.      HAS-BLED score 3 Hypertension Yes  Abnormal renal and liver function (Dialysis, transplant, Cr >2.26 mg/dL /Cirrhosis or Bilirubin >2x Normal or AST/ALT/AP >3x Normal) Yes  Stroke No  Bleeding No  Labile INR (Unstable/high INR) No  Elderly (>65) Yes  Drugs  or alcohol (>= 8 drinks/week, anti-plt or NSAID) No    CHA2DS2-VASc Score = 3  The patient's score is based upon: CHF History: 1 HTN History: 1 Diabetes History: 0 Stroke History: 0 Vascular Disease History: 0 Age Score: 1 Gender Score: 0        Presents for LAAO today. Procedure reviewed.       Signed, Leanora Prophet. Marven Slimmer, MD, [facility], [facility] 11/05/2023 Electrophysiology [facility]

## 2023-11-05 NOTE — Discharge Instructions (Signed)
 [facility] Procedure, Care After  Procedure MD: Dr. Isidoro Donning Clinical Coordinator: Karsten Fells, RN  This sheet gives you information about how to care for yourself after your procedure. Your health care provider may also give you more specific instructions. If you have problems or questions, contact your health care provider.  What can I expect after the procedure? After the procedure, it is common to have: Bruising around your puncture site. Tenderness around your puncture site. Tiredness (fatigue).  Medication instructions It is very important to continue to take your blood thinner as directed by your doctor after the Watchman procedure. Call your procedure doctor's office with question or concerns. If you are on Coumadin (warfarin), you will have your INR checked the week after your procedure, with a goal INR of 2.0 - 3.0. Please follow your medication instructions on your discharge summary. Only take the medications listed on your discharge paperwork.  Follow up You will be seen in 6 weeks after your procedure You will have a repeat CT scan or Echocardiogram approximately 8 weeks after your procedure mark to check your device You will follow up the MD/APP who performed your procedure 6 months after your procedure The Watchman Clinical Coordinator will check in with you from time to time, including 1 and 2 years after your procedure.  NO DENTAL CLEANINGS FOR 45 days. After that, you will require antibiotics for dental procedures the first 6 months.   Follow these instructions at home: Puncture site care  Follow instructions from your health care provider about how to take care of your puncture site. Make sure you: If present, leave stitches (sutures), skin glue, or adhesive strips in place.  If a large square bandage is present, this may be removed 24 hours after surgery.  Check your puncture site every day for signs of infection. Check for: Redness, swelling, or pain. Fluid  or blood. If your puncture site starts to bleed, lie down on your back, apply firm pressure to the area, and contact your health care provider. Warmth. Pus or a bad smell. Driving Do not drive yourself home if you received sedation Do not drive for at least 4 days after your procedure or however long your health care provider recommends. (Do not resume driving if you have previously been instructed not to drive for other health reasons.) Do not spend greater than 1 hour at a time in a car for the first 3 days. Stop and take a break with a 5 minute walk at least every hour.  Do not drive or use heavy machinery while taking prescription pain medicine.  Activity Avoid activities that take a lot of effort, including exercise, for at least 7 days after your procedure. For the first 3 days, avoid sitting for longer than one hour at a time.  Avoid alcoholic beverages, signing paperwork, or participating in legal proceedings for 24 hours after receiving sedation Do not lift anything that is heavier than 10 lb (4.5 kg) for one week.  No sexual activity for 1 week.  Return to your normal activities as told by your health care provider. Ask your health care provider what activities are safe for you. General instructions Take over-the-counter and prescription medicines only as told by your health care provider. Do not use any products that contain nicotine or tobacco, such as cigarettes and e-cigarettes. If you need help quitting, ask your health care provider. You may shower after 24 hours, but Do not take baths, swim, or use a hot tub for  1 week.  Do not drink alcohol for 24 hours after your procedure. Keep all follow-up visits as told by your health care provider. This is important. Dental Work: You will require antibiotics prior to any dental work, including cleanings, for 6 months after your Watchman implantation to help protect you from infection. After 6 months, antibiotics are no longer  required. Contact a health care provider if: You have redness, mild swelling, or pain around your puncture site. You have soreness in your throat or at your puncture site that does not improve after several days You have fluid or blood coming from your puncture site that stops after applying firm pressure to the area. Your puncture site feels warm to the touch. You have pus or a bad smell coming from your puncture site. You have a fever. You have chest pain or discomfort that spreads to your neck, jaw, or arm. You are sweating a lot. You feel nauseous. You have a fast or irregular heartbeat. You have shortness of breath. You are dizzy or light-headed and feel the need to lie down. You have pain or numbness in the arm or leg closest to your puncture site. Get help right away if: Your puncture site suddenly swells. Your puncture site is bleeding and the bleeding does not stop after applying firm pressure to the area. These symptoms may represent a serious problem that is an emergency. Do not wait to see if the symptoms will go away. Get medical help right away. Call your local emergency services (911 in the [place] Do not drive yourself to the hospital. Summary After the procedure, it is normal to have bruising and tenderness at the puncture site in your groin, neck, or forearm. Check your puncture site every day for signs of infection. Get help right away if your puncture site is bleeding and the bleeding does not stop after applying firm pressure to the area. This is a medical emergency.  This information is not intended to replace advice given to you by your health care provider. Make sure you discuss any questions you have with your health care provider.

## 2023-11-05 NOTE — Anesthesia Procedure Notes (Signed)
 Procedure Name: Intubation Date/Time: 11/05/2023 7:43 AM  Performed by: Artemisa Bile D, CRNAPre-anesthesia Checklist: Patient identified, Emergency Drugs available, Suction available and Patient being monitored Patient Re-evaluated:Patient Re-evaluated prior to induction Oxygen Delivery Method: Circle System Utilized Preoxygenation: Pre-oxygenation with 100% oxygen Induction Type: IV induction Ventilation: Mask ventilation without difficulty Laryngoscope Size: Mac and 4 Grade View: Grade I Tube type: Oral Tube size: 7.5 mm Number of attempts: 1 Airway Equipment and Method: Stylet and Oral airway Placement Confirmation: ETT inserted through vocal cords under direct vision, positive ETCO2 and breath sounds checked- equal and bilateral Secured at: 23 (@ teeth) cm Tube secured with: Tape Dental Injury: Teeth and Oropharynx as per pre-operative assessment  Comments: Atraumatic

## 2023-11-05 NOTE — Discharge Summary (Signed)
 Electrophysiology Discharge Summary   Patient ID: Craig Calhoun,  MRN: [id number], DOB/AGE: 1951-05-06 73 y.o.  Admit date: 11/05/2023 Discharge date: 11/05/2023  Primary Care Physician: Helaine Llanos, MD  Primary Cardiologist: Antionette Kirks, MD  Electrophysiologist: Boyce Byes, MD {Click to update primary MD,subspecialty MD or APP then REFRESH:1}    Primary Discharge Diagnosis:  Persistent Atrial Fibrillation Poor candidacy for long term anticoagulation due to preference to avoid long-term oral anticoagulation with elevated bleeding risk due to cirrhosis  Procedures This Admission:  Transeptal Puncture Intra-procedural TEE which showed no LAA thrombus Left atrial appendage occlusive device placement on 11/05/23 by Dr. Marven Slimmer.  *** This study demonstrated: ***  {Case conclusion here :1}   Brief HPI: Craig Calhoun is a 73 y.o. male with a history of Persistent Atrial Fibrillation who was referred to Electrophysiology in the outpatient setting    Hospital Course:  The patient was admitted and underwent left atrial appendage occlusive device placement as above.  The patient was monitored in the post procedure setting and has done very well with no concerns. Given this, he/she is being considered for same day discharge later today. Groin site has been stable without evidence of hematoma or bleeding. Wound care and restrictions were reviewed with the patient.   The patient has been scheduled for post procedure follow up with EP APP in approximately 6 weeks. They will restart Eliquis  this evening and continue for 45 days then stop. At that time he will transition to Plavix 75mg  daily to complete 6 months of therapy. They will require dental SBE for 6 month post op and should refrain from dental work or cleanings for the first 45 days post implant. SBE to be RXd at follow up.   A repeat CT scan will be performed in approximately 60 days to ensure proper seal of the  device.    Physical Exam: Vitals:   11/05/23 1215 11/05/23 1230 11/05/23 1245 11/05/23 1300  BP: 132/84 (!) 127/98 124/68 125/72  Pulse: 65 68 65 61  Resp: 17 14 17 17   Temp:      TempSrc:      SpO2: 92% 93% 91% 92%  Weight:      Height:        GEN: Well nourished, well developed in no acute distress NECK: No JVD; No carotid bruits CARDIAC: Regular rate and rhythm, no murmurs, rubs, gallops RESPIRATORY:  Clear to auscultation without rales, wheezing or rhonchi  ABDOMEN: Soft, non-tender, non-distended EXTREMITIES:  No edema; No deformity. Groin site Stable     Discharge Medications:  Allergies as of 11/05/2023       Reactions   Lisinopril  Swelling   Uvula swelling---?angioedema        Medication List     TAKE these medications    acetaminophen  325 MG tablet Commonly known as: TYLENOL  Take 2 tablets (650 mg total) by mouth every 4 (four) hours as needed for headache or mild pain (pain score 1-3).   carvedilol  3.125 MG tablet Commonly known as: COREG  Take 1 tablet (3.125 mg total) by mouth 2 (two) times daily with a meal.   diphenhydramine-acetaminophen  25-500 MG Tabs tablet Commonly known as: TYLENOL  PM Take 1 tablet by mouth at bedtime as needed (sleep).   Eliquis  5 MG Tabs tablet Generic drug: apixaban  TAKE ONE TABLET TWICE DAILY   Entresto  49-51 MG Generic drug: sacubitril -valsartan  Take 1 tablet by mouth 2 (two) times daily.        Disposition:  Home with usual follow up as in AVS  Duration of Discharge Encounter:  APP Time: 75  Signed, Howie Betton, PA-C  11/05/2023 1:36 PM

## 2023-11-06 ENCOUNTER — Encounter (HOSPITAL_COMMUNITY): Payer: Self-pay | Admitting: Cardiology

## 2023-11-06 MED FILL — Fentanyl Citrate Preservative Free (PF) Inj 100 MCG/2ML: INTRAMUSCULAR | Qty: 2 | Status: AC

## 2023-11-07 NOTE — Anesthesia Postprocedure Evaluation (Signed)
 Anesthesia Post Note  Patient: Craig Calhoun  Procedure(s) Performed: LEFT ATRIAL APPENDAGE OCCLUSION TRANSESOPHAGEAL ECHOCARDIOGRAM     Patient location during evaluation: PACU Anesthesia Type: General Level of consciousness: awake and alert Pain management: pain level controlled Vital Signs Assessment: post-procedure vital signs reviewed and stable Respiratory status: spontaneous breathing, nonlabored ventilation, respiratory function stable and patient connected to nasal cannula oxygen Cardiovascular status: blood pressure returned to baseline and stable Postop Assessment: no apparent nausea or vomiting Anesthetic complications: no   There were no known notable events for this encounter.  Last Vitals:  Vitals:   11/05/23 1418 11/05/23 1432  BP:    Pulse: 91 88  Resp: 20 19  Temp:    SpO2: (S) (!) 84% (!) 87%    Last Pain:  Vitals:   11/05/23 1159  TempSrc: Oral  PainSc: 0-No pain   Pain Goal: Patients Stated Pain Goal: 0 (11/05/23 0556)                 Melvenia Stabs

## 2023-11-09 ENCOUNTER — Telehealth: Payer: Self-pay

## 2023-11-09 ENCOUNTER — Other Ambulatory Visit: Payer: Self-pay

## 2023-11-09 ENCOUNTER — Telehealth: Payer: Self-pay | Admitting: Cardiology

## 2023-11-09 DIAGNOSIS — I4819 Other persistent atrial fibrillation: Secondary | ICD-10-CM

## 2023-11-09 DIAGNOSIS — Z95818 Presence of other cardiac implants and grafts: Secondary | ICD-10-CM

## 2023-11-09 NOTE — Telephone Encounter (Signed)
 Patient identification verified by 2 forms. Craig Duck, RN     Called and spoke to patient  Patient states:  - complete watchman procedure on 5/15 - itching all over body.  - redness but no raised bumps or appearance of a rash  - itching and redness present for three days. - taking cortizone 10 and that has helped the itching and redness.   Patient denies:  - recent change in diet or medications.  - SOB, chest pain, blurred vision, headache, one-sided muscle weakness, warmth or swelling in extremities.              Interventions/Plan: - Recommended patient be evaluated at urgent care or PCP.  - Encounter forwarded to primary cardiologist for review/recommendation.    Reviewed ED warning signs/precautions  Patient agrees with plan, no questions at this time

## 2023-11-09 NOTE — Telephone Encounter (Signed)
 Patient stating that he has a tingling sensation on his back, arms and chest. He states the area is red but their is no pain. Please advise

## 2023-11-09 NOTE — Telephone Encounter (Signed)
 I would defer to Dr. Marven Slimmer given that this is post Watchman device placement.  Not entirely sure if he is having an allergic reaction.

## 2023-11-09 NOTE — Telephone Encounter (Signed)
  [facility]   Watchman Team  Contacted the patient regarding discharge from [facility] on 11/05/2023  The patient understands to follow up with Suzann Riddle on 12/21/2023  The patient understands discharge instructions? Yes  The patient understands medications and regimen? Yes   The patient reports groin site looks healthy with no S/S of bleeding or infection.   The patient reports itching on his chest and back for 2-3 days. There is no rash, but the skin is slightly pink. He denies shortness of breath, facial swelling, fever, wheezing, nausea. He has no other symptoms other than nagging itching. He has no contrast allergy and this has not happened before with other testing. Informed him he can try Benadryl (either oral or topical) to see if he gets any relief. If symptoms progress or if he displays any facial/tongue swelling or difficulty breathing, he will seek emergency medical attention.  Will route to Dr. Marven Slimmer for recommendations.

## 2023-11-11 NOTE — Telephone Encounter (Signed)
 See other 11/09/2023 phone note.

## 2023-11-12 NOTE — Telephone Encounter (Signed)
 Called to check on the patient. He reports the itching has almost completely subsided.  Reiterated to him that per Dr. Marven Slimmer, the itching could have been caused by the defib pads or a mild reaction to the contrast.  He was grateful for check-in and follow-up.

## 2023-12-08 ENCOUNTER — Ambulatory Visit: Admitting: Cardiology

## 2023-12-20 ENCOUNTER — Encounter: Payer: Self-pay | Admitting: Cardiology

## 2023-12-20 DIAGNOSIS — I5032 Chronic diastolic (congestive) heart failure: Secondary | ICD-10-CM | POA: Insufficient documentation

## 2023-12-20 NOTE — Progress Notes (Unsigned)
 Electrophysiology Clinic Note    Date:  12/21/2023  Patient ID:  Craig Calhoun, Craig Calhoun July 11, 1950, MRN [id number] PCP:  Jimmy Charlie FERNS, MD  Cardiologist:  Deatrice Cage, MD Electrophysiologist: OLE ONEIDA HOLTS, MD   Discussed the use of AI scribe software for clinical note transcription with the patient, who gave verbal consent to proceed.   Patient Profile    Chief Complaint: AF ablation and Watchman follow-up  History of Present Illness: Craig Calhoun is a 73 y.o. male with PMH notable for persis Afib, HFimpEF, dilated ascending aorta, HTN, hep C (treated), tobacco use, ; seen today for OLE ONEIDA HOLTS, MD for routine electrophysiology followup.   He is s/p AF ablation w isolation of pulm veins, posterior wall 09/09/2023 by Dr. HOLTS. I saw him in clinic for his 69-month post-ablation appt where he was not having Afib, but had noticed DOE with strenuous activities. He requested to pursue LAAO.   He is s/p LAAO with Watchman device on 11/05/2023 by Dr. HOLTS.   On follow-up today, he is not aware of any recent A-fib episodes.  He has fully recovered from recent Watchman device implant.  Groin access sites are completely healed without bruising, tenderness, swelling.  He remains very active in his garden.  Continues to take eliquis  BID, no bleeding concerns.      Arrhythmia/Device History No specialty comments available.    ROS:  Please see the history of present illness. All other systems are reviewed and otherwise negative.    Physical Exam    VS:  BP (!) 148/82 (BP Location: Left Arm, Patient Position: Sitting, Cuff Size: Normal)   Pulse (!) 55   Ht 6' 2 (1.88 m)   Wt 192 lb 3.2 oz (87.2 kg)   SpO2 96%   BMI 24.68 kg/m  BMI: Body mass index is 24.68 kg/m.  Wt Readings from Last 3 Encounters:  12/21/23 192 lb 3.2 oz (87.2 kg)  11/05/23 190 lb (86.2 kg)  10/06/23 194 lb (88 kg)     GEN- The patient is well appearing, alert and oriented x 3  today.   Lungs- Clear to ausculation bilaterally, normal work of breathing.  Heart- Regular rate and rhythm, no murmurs, rubs or gallops Extremities- No peripheral edema, warm, dry    Studies Reviewed   Previous EP, cardiology notes.    EKG is ordered. Personal review of EKG from today shows:    EKG Interpretation Date/Time:  Monday December 21 2023 10:05:33 EDT Ventricular Rate:  55 PR Interval:  174 QRS Duration:  84 QT Interval:  414 QTC Calculation: 396 R Axis:   61  Text Interpretation: Sinus bradycardia Confirmed by [name] [phone]) on 12/21/2023 10:08:49 AM    TEE, 11/05/2023  1. Left ventricular ejection fraction, by estimation, is 60 to 65%. The left ventricle has normal function. The left ventricle has no regional wall motion abnormalities.   2. Right ventricular systolic function is normal. The right ventricular size is normal.   3. Left atrial size was moderately dilated. No left atrial/left atrial appendage thrombus was detected.   4. Right atrial size was moderately dilated.   5. The mitral valve is normal in structure. Trivial mitral valve regurgitation. No evidence of mitral stenosis.   6. The aortic valve is tricuspid. There is mild calcification of the aortic valve. There is mild thickening of the aortic valve. Aortic valve regurgitation is not visualized. Aortic valve sclerosis is present, with no evidence of aortic  valve stenosis.   7. There is mild (Grade II) protruding plaque involving the descending aorta.   8. The inferior vena cava is normal in size with greater than 50% respiratory variability, suggesting right atrial pressure of 3 mmHg.   9. 3D performed of the LAA. 3D based dimensions used for device size assessment and to evaluate for peridevice leak.   Cardiac CT, 08/24/2023 1.  No LAA thrombus mild bi atrial enlargement  2. Chicken wing appendage with landing zone measurements above suitable for a 27 mm Watchman FLX device  3.  Dilated ascending  thoracic aorta 4.0 cm  4.  No pericardial effusion  5.  Calcium score 156 which is 47 th percentile for age/sex  6.  No ASD/PFO  7.  Normal PV anatomy see measurements above  TTE, 03/03/2023  1. Left ventricular ejection fraction, by estimation, is 40 to 45%. Left ventricular ejection fraction by 3D volume is 39 %. The left ventricle has mildly decreased function. The left ventricle demonstrates global hypokinesis. Left ventricular diastolic  parameters are indeterminate. The average left ventricular global longitudinal strain is -11.2 %.   2. Right ventricular systolic function is low normal. The right ventricular size is mildly enlarged.   3. Left atrial size was mildly dilated.   4. The mitral valve is normal in structure. Moderate mitral valve regurgitation. No evidence of mitral stenosis.   5. The aortic valve is tricuspid. Aortic valve regurgitation is not visualized. Aortic valve sclerosis is present, with no evidence of aortic valve stenosis.   6. There is mild dilatation of the ascending aorta, measuring 42 mm. There is borderline dilatation of the aortic root, measuring 37 mm.   7. The inferior vena cava is normal in size with greater than 50% respiratory variability, suggesting right atrial pressure of 3 mmHg.    Assessment and Plan     #) persis Afib S/p AF ablation 08/2023 Maintaining sinus rhythm today   #) Hypercoag d/t persis afib #) cirrhosis #) s/p LAAO with Watchman Will transition eliquis  to 75mg  plavix CT to eval watchman is scheduled for 7/14 Update BMP today No bleeding concerns   #) dental ppx Recommended to take 2g amox prior to dental procedures   #) HFmrEF #) HTN LVEF reduction though to be tachy-mediated Recent TEE with normal LVEF NYHA II, warm and dry on exam Continue 49-51 entresto  BID, 3.125 coreg  BID Recommended he check BP regualrly at home and bring BP log to follow-up appts        Current medicines are reviewed at length with the  patient today.   The patient does not have concerns regarding his medicines.  The following changes were made today:   STOP eliquis  START 75mg  plavix daily  Labs/ tests ordered today include:  Orders Placed This Encounter  Procedures   Basic metabolic panel   EKG 12-Lead     Disposition: Follow up with Dr. Cindie or EP APP in 12 months  Follow-up with gen cards in 3 months. Bring BP log to follow-up appt    Signed, Chantal Needle, NP  12/21/23  5:12 PM  Electrophysiology [facility]

## 2023-12-21 ENCOUNTER — Ambulatory Visit: Attending: Cardiology | Admitting: Cardiology

## 2023-12-21 VITALS — BP 148/82 | HR 55 | Ht 74.0 in | Wt 192.2 lb

## 2023-12-21 DIAGNOSIS — D6869 Other thrombophilia: Secondary | ICD-10-CM | POA: Diagnosis not present

## 2023-12-21 DIAGNOSIS — I4819 Other persistent atrial fibrillation: Secondary | ICD-10-CM | POA: Diagnosis not present

## 2023-12-21 DIAGNOSIS — I5032 Chronic diastolic (congestive) heart failure: Secondary | ICD-10-CM | POA: Diagnosis not present

## 2023-12-21 DIAGNOSIS — Z95818 Presence of other cardiac implants and grafts: Secondary | ICD-10-CM

## 2023-12-21 MED ORDER — AMOXICILLIN 500 MG PO CAPS: 2000.0000 mg | ORAL_CAPSULE | Freq: Once | ORAL | 4 refills | Status: AC | PRN

## 2023-12-21 MED ORDER — CLOPIDOGREL BISULFATE 75 MG PO TABS: 75.0000 mg | ORAL_TABLET | Freq: Every day | ORAL | 3 refills | Status: DC

## 2023-12-21 NOTE — Patient Instructions (Signed)
 Medication Instructions:  Take Amoxicillin 2 grams 1 hour before dental work.   STOP Eliquis  START Plavix 75 mg - take 1 tablet daily   *If you need a refill on your cardiac medications before your next appointment, please call your pharmacy*  Lab Work: Your provider would like for you to have following labs drawn today BMET.    If you have labs (blood work) drawn today and your tests are completely normal, you will receive your results only by: MyChart Message (if you have MyChart) OR A paper copy in the mail If you have any lab test that is abnormal or we need to change your treatment, we will call you to review the results.   Follow-Up: At [facility], you and your health needs are our priority.  As part of our continuing mission to provide you with exceptional heart care, our providers are all part of one team.  This team includes your primary Cardiologist (physician) and Advanced Practice Providers or APPs (Physician Assistants and Nurse Practitioners) who all work together to provide you with the care you need, when you need it.  Your next appointment:   3 month(s)  Provider:   Lonni Meager, NP   12 month with Dr.Lambert or Suzann Riddle, NP   We recommend signing up for the patient portal called MyChart.  Sign up information is provided on this After Visit Summary.  MyChart is used to connect with patients for Virtual Visits (Telemedicine).  Patients are able to view lab/test results, encounter notes, upcoming appointments, etc.  Non-urgent messages can be sent to your provider as well.   To learn more about what you can do with MyChart, go to [email or web address].   Other Instructions Take blood pressure and bring log with you to your follow up with Medford Meager, NP

## 2023-12-22 ENCOUNTER — Ambulatory Visit: Payer: Self-pay | Admitting: Cardiology

## 2023-12-22 LAB — BASIC METABOLIC PANEL WITH GFR
BUN/Creatinine Ratio: 22 (ref 10–24)
BUN: 20 mg/dL (ref 8–27)
CO2: 22 mmol/L (ref 20–29)
Calcium: 9.7 mg/dL (ref 8.6–10.2)
Chloride: 105 mmol/L (ref 96–106)
Creatinine, Ser: 0.89 mg/dL (ref 0.76–1.27)
Glucose: 100 mg/dL — ABNORMAL HIGH (ref 70–99)
Potassium: 4.8 mmol/L (ref 3.5–5.2)
Sodium: 141 mmol/L (ref 134–144)
eGFR: 90 mL/min/{1.73_m2} (ref >59)

## 2023-12-30 ENCOUNTER — Ambulatory Visit: Admitting: Nurse Practitioner

## 2024-01-01 ENCOUNTER — Other Ambulatory Visit: Payer: Self-pay | Admitting: Nurse Practitioner

## 2024-01-01 DIAGNOSIS — K7469 Other cirrhosis of liver: Secondary | ICD-10-CM | POA: Diagnosis not present

## 2024-01-04 ENCOUNTER — Ambulatory Visit (HOSPITAL_COMMUNITY)
Admission: RE | Admit: 2024-01-04 | Discharge: 2024-01-04 | Disposition: A | Discharge status: Home / Self Care | Source: Ambulatory Visit | Attending: Internal Medicine | Admitting: Internal Medicine

## 2024-01-04 ENCOUNTER — Ambulatory Visit: Payer: Self-pay | Admitting: Cardiology

## 2024-01-04 DIAGNOSIS — I4819 Other persistent atrial fibrillation: Secondary | ICD-10-CM | POA: Insufficient documentation

## 2024-01-04 DIAGNOSIS — Z95818 Presence of other cardiac implants and grafts: Secondary | ICD-10-CM | POA: Diagnosis not present

## 2024-01-04 DIAGNOSIS — I517 Cardiomegaly: Secondary | ICD-10-CM | POA: Insufficient documentation

## 2024-01-04 MED ORDER — IOHEXOL 350 MG/ML SOLN
100.0000 mL | Freq: Once | INTRAVENOUS | Status: AC | PRN
  Administered 2024-01-04: 80 mL via INTRAVENOUS

## 2024-01-05 ENCOUNTER — Ambulatory Visit
Admission: RE | Admit: 2024-01-05 | Discharge: 2024-01-05 | Disposition: A | Discharge status: Home / Self Care | Source: Ambulatory Visit | Attending: Nurse Practitioner | Admitting: Nurse Practitioner

## 2024-01-05 DIAGNOSIS — K824 Cholesterolosis of gallbladder: Secondary | ICD-10-CM | POA: Diagnosis not present

## 2024-01-05 DIAGNOSIS — K7469 Other cirrhosis of liver: Secondary | ICD-10-CM | POA: Diagnosis not present

## 2024-01-05 DIAGNOSIS — K7689 Other specified diseases of liver: Secondary | ICD-10-CM | POA: Diagnosis not present

## 2024-01-05 DIAGNOSIS — K828 Other specified diseases of gallbladder: Secondary | ICD-10-CM | POA: Diagnosis not present

## 2024-03-12 ENCOUNTER — Other Ambulatory Visit: Payer: Self-pay | Admitting: Nurse Practitioner

## 2024-03-31 ENCOUNTER — Ambulatory Visit: Admitting: Nurse Practitioner

## 2024-04-06 ENCOUNTER — Ambulatory Visit: Attending: Nurse Practitioner | Admitting: Nurse Practitioner

## 2024-04-06 ENCOUNTER — Encounter: Payer: Self-pay | Admitting: Nurse Practitioner

## 2024-04-06 VITALS — BP 158/78 | HR 79 | Ht 74.0 in | Wt 188.2 lb

## 2024-04-06 DIAGNOSIS — I4819 Other persistent atrial fibrillation: Secondary | ICD-10-CM | POA: Diagnosis not present

## 2024-04-06 DIAGNOSIS — I5020 Unspecified systolic (congestive) heart failure: Secondary | ICD-10-CM | POA: Diagnosis not present

## 2024-04-06 DIAGNOSIS — I10 Essential (primary) hypertension: Secondary | ICD-10-CM | POA: Diagnosis not present

## 2024-04-06 DIAGNOSIS — I2510 Atherosclerotic heart disease of native coronary artery without angina pectoris: Secondary | ICD-10-CM

## 2024-04-06 DIAGNOSIS — I77810 Thoracic aortic ectasia: Secondary | ICD-10-CM

## 2024-04-06 DIAGNOSIS — E785 Hyperlipidemia, unspecified: Secondary | ICD-10-CM | POA: Diagnosis not present

## 2024-04-06 DIAGNOSIS — I428 Other cardiomyopathies: Secondary | ICD-10-CM | POA: Diagnosis not present

## 2024-04-06 DIAGNOSIS — R911 Solitary pulmonary nodule: Secondary | ICD-10-CM

## 2024-04-06 MED ORDER — SACUBITRIL-VALSARTAN 97-103 MG PO TABS: 1.0000 | ORAL_TABLET | Freq: Two times a day (BID) | ORAL | 11 refills | Status: AC

## 2024-04-06 NOTE — Progress Notes (Signed)
 Office Visit    Patient Name: Craig Calhoun Date of Encounter: 04/06/2024  Primary Care Provider:  Jimmy Charlie FERNS, MD Primary Cardiologist:  Craig Cage, MD  Cardiology APP:  Craig Lonni Ingle, NP  Electrophysiologist:  Craig Calhoun HOLTS, MD   Chief Complaint    73 y.o. male with a history of persistent atrial fibrillation status post catheter ablation and watchman, hypertension, dilated ascending aorta/root, GERD, hepatitis C, and tobacco abuse, who presents for A-fib follow-up.  Past Medical History   Subjective   Past Medical History:  Diagnosis Date   Acquired dilation of ascending aorta and aortic root    a. 02/2023 Echo: Asc Ao 42mm, Ao root 37mm; b. 12/2023 CTA: Asc Ao 40mm.   Arthritis    CAD (coronary artery disease)    a. 03/26/2023 Cor CTA: Ca2+ 165 (48 percentile), 25-49% stenosis within the LAD and mid RCA, and less than 25% stenosis in the proximal circumflex; b. 12/2023 CTA: Ca2+ = 156 (52nd%'ile).   GERD (gastroesophageal reflux disease)    Heart failure with improved ejection fraction (HFimpEF) (HCC)    Hepatitis C    Rx interferon/ribaviron 2004-'05 (recurred after Rx), treated and cleared with Harvoni 2016   Hypertension    NICM (nonischemic cardiomyopathy) (HCC)    a. Presumed to be 2/2 afib-->01/2019 Echo: EF 40-50%; b. 02/2019 Echo: EF 50-55% (sinus rhythm); c. 02/2023 Echo: EF 40-45% (afib), glob HK, low-nl RV fxn, mildly dil LA, mod MR, Asc Ao 42mm, Ao root 37mm; d. 10/2023 TEE (sinus rhythm): EF 60-65%, no rwma, nl RV fxn, mod dil LA/RA, triv MR, AoV sclerosis w/o stenosis.   Persistent atrial fibrillation (HCC)    a. 01/2019 s/p DCCV (200J); b. 02/2023 s/p DCCV (150J - biphasic); c. 08/2023 s/p Afib ablation/PVI; c. 10/2023 s/p Watchman.   Presence of Watchman left atrial appendage closure device    a. 12/2023 CTA: fully endothelialized 31 mm Watchman FLX device without leak and average compression of 17%.  Residual septal defect with  left-to-right shunting was noted.   Pulmonary nodule    a. 12/2023 CTA Chest: Stable 4 mm right lower lobe pulmonary nodule.   Past Surgical History:  Procedure Laterality Date   APPENDECTOMY     ATRIAL FIBRILLATION ABLATION N/A 09/09/2023   Procedure: ATRIAL FIBRILLATION ABLATION;  Surgeon: Calhoun Craig ONEIDA, MD;  Location: [facility];  Service: Cardiovascular;  Laterality: N/A;   CARDIOVERSION N/A 02/07/2019   Procedure: CARDIOVERSION;  Surgeon: Craig Craig LABOR, MD;  Location: [facility];  Service: Cardiovascular;  Laterality: N/A;   CARDIOVERSION N/A 03/10/2023   Procedure: CARDIOVERSION;  Surgeon: Craig Craig PARAS, MD;  Location: [facility];  Service: Cardiovascular;  Laterality: N/A;   COLONOSCOPY  09/2010   1 polyp-tubular adenoma   COLONOSCOPY N/A 05/02/2016   Procedure: COLONOSCOPY;  Surgeon: Craig Calhoun Cota, MD;  Location: [facility];  Service: Endoscopy;  Laterality: N/A;   COLONOSCOPY WITH PROPOFOL  N/A 07/31/2021   Procedure: COLONOSCOPY WITH PROPOFOL ;  Surgeon: Calhoun Craig LELON, MD;  Location: [facility];  Service: Endoscopy;  Laterality: N/A;   LEFT ATRIAL APPENDAGE OCCLUSION N/A 11/05/2023   Procedure: LEFT ATRIAL APPENDAGE OCCLUSION;  Surgeon: Calhoun Craig ONEIDA, MD;  Location: [facility];  Service: Cardiovascular;  Laterality: N/A;   LIVER BIOPSY     LUMBAR LAMINECTOMY     MENISCUS REPAIR Left 4/14   TRANSESOPHAGEAL ECHOCARDIOGRAM (CATH LAB) N/A 11/05/2023   Procedure: TRANSESOPHAGEAL ECHOCARDIOGRAM;  Surgeon: Calhoun Craig ONEIDA, MD;  Location: [facility];  Service: Cardiovascular;  Laterality: N/A;    Allergies  Allergies  Allergen Reactions   Lisinopril  Swelling    Uvula swelling---?angioedema       History of Present Illness      73 y.o. y/o male with a  h/o persistent Afib status post catheter ablation and Watchman, HTN, dilated Asc Ao/root, GERD, hepatitis C, hepatic cirrhosis, and tob abuse.  He was previously diagnosed with  atrial fibrillation in 2020.  Echocardiogram in the setting of afib showed EF of 40 to 45%.  He subsequently underwent cardioversion in August 2020 with restoration of sinus rhythm and improvement in EF to 50-55%.  Of note, he was incidentally found to be COVID-positive prior to his cardioversion.    Craig Calhoun has been managed with beta-blocker and Eliquis  therapy.  In August 2024, he was noted to have irregular heart rhythm by his primary care provider, and complained of progressive dyspnea on exertion.  Given high suspicion for recurrent atrial fibrillation, he was set up to see Dr. Darron and prior to that visit, an echocardiogram was performed, and showed recurrent LV dysfunction with an EF of 40 to 45% with global hypokinesis and low normal RV function.  The left atrium was mildly dilated.  Moderate MR was noted, along with a 42 mm ascending aorta and 37 mm aortic root.  He saw Dr. Darron on March 05, 2023 subsequently underwent successful cardioversion on March 10, 2023.  In the setting of fatigue and bradycardia, beta-blocker dose was subsequently reduced.  Coronary CT angiogram in October 2024 showed calcium score of 165 (48th percentile), and a 25 to 49% stenoses in the LAD and mid RCA, and less than 25% stenosis in the proximal left circumflex.  Mild dilatation (4.1 cm) of the ascending aorta was noted with suspected hepatic cirrhosis on noncardiac imaging.  He was seen by Dr. Cindie in 04/2023 and underwent A-fib ablation in March 2025.  He subsequently underwent Watchman device in May 2025 due to concerns related to elevated bleeding risk with anticoagulation and history of hepatic cirrhosis.  Post watchman placement CTA showed fully endothelialized 31 mm Watchman FLX device without leak and average compression of 17%.  Residual septal defect with left-to-right shunting was noted.  Stable dilated ascending thoracic aorta was noted-4 cm.  Stable 4 mm right lower lobe pulmonary nodule also  noted.  Calcium score of 156 (52nd percentile).      Over the past few months, Craig Calhoun has felt well.  He has remained relatively active and is now hunting, planning on going to a deer stand soon as he leaves here.  He does not experience chest pain or dyspnea with usual activity but notes that if he is carrying a 50 pound bag of dog food, he will become a little bit short of breath.  He denies palpitations, PND, orthopnea, dizziness, syncope, edema, or early satiety.  His blood pressure is elevated today and he notes that it is typically similar at home.  He continues to smoke an occasional cigarette. Objective   Home Medications    Current Outpatient Medications  Medication Sig Dispense Refill   acetaminophen  (TYLENOL ) 325 MG tablet Take 2 tablets (650 mg total) by mouth every 4 (four) hours as needed for headache or mild pain (pain score 1-3).     amoxicillin  (AMOXIL ) 500 MG capsule Take 4 capsules (2,000 mg total) by mouth once as needed (prior to dental procedures). 4 capsule 4   carvedilol  (  COREG ) 3.125 MG tablet TAKE ONE (1) TABLET BY MOUTH TWO TIMES PER DAY WITH A MEAL 180 tablet 3   diphenhydramine-acetaminophen  (TYLENOL  PM) 25-500 MG TABS tablet Take 1 tablet by mouth at bedtime as needed (sleep).     sacubitril -valsartan  (ENTRESTO ) 97-103 MG Take 1 tablet by mouth 2 (two) times daily. 60 tablet 11   No current facility-administered medications for this visit.     Physical Exam    VS:  BP (!) 158/78 (BP Location: Left Arm, Patient Position: Sitting, Cuff Size: Normal)   Pulse 79   Ht 6' 2 (1.88 m)   Wt 188 lb 4 oz (85.4 kg)   SpO2 97%   BMI 24.17 kg/m  , BMI Body mass index is 24.17 kg/m.          GEN: Well nourished, well developed, in no acute distress. HEENT: normal. Neck: Supple, no JVD, carotid bruits, or masses. Cardiac: RRR, no murmurs, rubs, or gallops. No clubbing, cyanosis, edema.  Radials 2+/PT 2+ and equal bilaterally.  Respiratory:  Respirations regular  and unlabored, diminished breath sounds bilaterally. GI: Soft, nontender, nondistended, BS + x 4. MS: no deformity or atrophy. Skin: warm and dry, no rash. Neuro:  Strength and sensation are intact. Psych: Normal affect.  Accessory Clinical Findings    ECG personally reviewed by me today - EKG Interpretation Date/Time:  Wednesday April 06 2024 13:34:47 EDT Ventricular Rate:  79 PR Interval:  176 QRS Duration:  92 QT Interval:  380 QTC Calculation: 435 R Axis:   64  Text Interpretation: Normal sinus rhythm Normal ECG Confirmed by Craig Bruckner [phone]) on 04/06/2024 1:37:29 PM  - no acute changes.  Lab Results  Component Value Date   WBC 5.2 10/22/2023   HGB 14.4 10/22/2023   HCT 44.8 10/22/2023   MCV 94 10/22/2023   PLT 264 10/22/2023   Lab Results  Component Value Date   CREATININE 0.89 12/21/2023   BUN 20 12/21/2023   NA 141 12/21/2023   K 4.8 12/21/2023   CL 105 12/21/2023   CO2 22 12/21/2023   Lab Results  Component Value Date   ALT 14 11/05/2023   AST 23 11/05/2023   ALKPHOS 49 11/05/2023   BILITOT 0.6 11/05/2023   Lab Results  Component Value Date   CHOL 182 12/17/2018   HDL 36.00 (L) 12/17/2018   LDLCALC 106 (H) 12/17/2018   TRIG 196.0 (H) 12/17/2018   CHOLHDL 5 12/17/2018    Lab Results  Component Value Date   TSH 0.54 10/27/2012       Assessment & Plan    1.  Persistent atrial fibrillation: Status post A-fib ablation March 2025 subsequent watchman placement in May 2025.  CTA in July 2025 showed full endothelialization of the Watchman with residual septal defect and left-to-right shunting.  He has been feeling well without known recurrence of A-fib.  He is in sinus rhythm today.  He is still on Plavix  and will be due to discontinue on November 15, having completed 6 months.  He remains on beta-blocker therapy.  2.  Nonischemic cardiomyopathy/chronic heart failure with improved ejection fraction: History of LV dysfunction with EF of 40-45% in  the setting of A-fib in the past.  Most recent TEE in May 2025 showed improvement in EF to 60-65% in the setting of sinus rhythm.  He has been managed with beta-blocker and Entresto  and is euvolemic on examination today.  He has been feeling well without dyspnea or edema.  Titrating Entresto   in the setting of hypertension.  3.  Primary hypertension: Blood pressure elevated today at 158/78 and he notes that this is similar to how it has been trending at home.  We discussed increasing his Entresto  from 49-51 to 97-103 twice a day.  We had tried this once before last December, which time he had dramatic reductions in blood pressure.  We discussed trying again versus adding a third medication, as titration of carvedilol  is not possible due to prior history of bradycardia.  He was willing to try the higher dose of Entresto  again.  If he does not tolerate this time, would likely look to add low-dose amlodipine.  4.  Dilated ascending aorta/aortic root: Stable ascending aortic root dilatation at 4 cm on CT imaging in July of this year.  Will need follow-up imaging next year.  5.  Tobacco abuse/right lower lobe pulmonary nodule: He had quit smoking but still occasionally smokes a cigarette.  4 mm nodule noted on CT over the summer.  He was not aware of this.  We discussed that given ongoing smoking, this will need surveillance imaging, which we can plan to obtain next summer.  Smoking cessation advised.  6.  Hepatitis C/hepatic cirrhosis: Status post prior treatment sustained virologic response and cure.  Coronary CTA in October 2024 showed suspected hepatic cirrhosis.  He is followed at [facility] and had normal LFTs earlier this year.  7.  Coronary artery disease/hyperlipidemia: CT angiogram in October 2024 showed mild to moderate LAD and RCA disease with mild left circumflex disease.  He does not experience chest pain or dyspnea and is reasonably active.  LDL was 106 in 2020 but has not been checked since.  I am  arranging for fasting lipids.  Statin historically not an option due to history of hepatic cirrhosis.  Can consider nonstatin alternative based on results.  8.  Disposition: Follow-up fasting lipids and complete metabolic panel in approximately 1 week.  Follow-up with EP as planned in approximately 9 months.   Lonni Meager, NP 04/06/2024, 2:11 PM

## 2024-04-06 NOTE — Patient Instructions (Signed)
 Medication Instructions:  STOP the Clopidogrel  (Plavix ) after you have finished the current bottle  INCREASE the Entresto  to 97-103 mg twice daily  *If you need a refill on your cardiac medications before your next appointment, please call your pharmacy*  Lab Work: Your provider would like for you to return in one week to have the following labs drawn: Fasting lipid and CMET.   Please go to [facility] [address] ([facility] [address], [place] [address] You do not need an appointment.  They are open from 8 am- 4:30 pm.  Lunch from 1:00 pm- 2:00 pm You will need to be fasting.   You may also go to one of the following [facility]:  [address] [place], [place] [address] Phone: [phone] Lab hours: Mon-Fri 8 am- 5 pm    Lunch 12 pm- 1 pm  [address] [place],  [place]  [address]  [place]  Phone: [phone] Lab hours: 7 am- 4 pm Lunch 12 pm-1 pm   [address] [place],  [place]  [address]  [place]  Phone: [phone] Lab hours: Mon-Fri 8 am- 5 pm    Lunch 12 pm- 1 pm  If you have labs (blood work) drawn today and your tests are completely normal, you will receive your results only by: MyChart Message (if you have MyChart) OR A paper copy in the mail If you have any lab test that is abnormal or we need to change your treatment, we will call you to review the results.  Testing/Procedures: None ordered  Follow-Up: At [facility], you and your health needs are our priority.  As part of our continuing mission to provide you with exceptional heart care, our providers are all part of one team.  This team includes your primary Cardiologist (physician) and Advanced Practice Providers or APPs (Physician Assistants and Nurse Practitioners) who all work together to provide you with the care you need, when you need it.  Your next appointment:   Keep your planned follow up in June 2026 with Suzann Riddle, NP

## 2024-04-13 DIAGNOSIS — I5020 Unspecified systolic (congestive) heart failure: Secondary | ICD-10-CM | POA: Diagnosis not present

## 2024-04-14 LAB — LIPID PANEL
Chol/HDL Ratio: 7 ratio — ABNORMAL HIGH (ref 0.0–5.0)
Cholesterol, Total: 231 mg/dL — ABNORMAL HIGH (ref 100–199)
HDL: 33 mg/dL — ABNORMAL LOW (ref >39)
LDL Chol Calc (NIH): 169 mg/dL — ABNORMAL HIGH (ref 0–99)
Triglycerides: 157 mg/dL — ABNORMAL HIGH (ref 0–149)
VLDL Cholesterol Cal: 29 mg/dL (ref 5–40)

## 2024-04-14 LAB — COMPREHENSIVE METABOLIC PANEL WITH GFR
ALT: 10 IU/L (ref 0–44)
AST: 16 IU/L (ref 0–40)
Albumin: 4.4 g/dL (ref 3.8–4.8)
Alkaline Phosphatase: 72 IU/L (ref 47–123)
BUN/Creatinine Ratio: 21 (ref 10–24)
BUN: 18 mg/dL (ref 8–27)
Bilirubin Total: 0.5 mg/dL (ref 0.0–1.2)
CO2: 20 mmol/L (ref 20–29)
Calcium: 9.6 mg/dL (ref 8.6–10.2)
Chloride: 104 mmol/L (ref 96–106)
Creatinine, Ser: 0.84 mg/dL (ref 0.76–1.27)
Globulin, Total: 3.1 g/dL (ref 1.5–4.5)
Glucose: 96 mg/dL (ref 70–99)
Potassium: 5.1 mmol/L (ref 3.5–5.2)
Sodium: 140 mmol/L (ref 134–144)
Total Protein: 7.5 g/dL (ref 6.0–8.5)
eGFR: 92 mL/min/1.73 (ref >59)

## 2024-04-15 ENCOUNTER — Ambulatory Visit: Payer: Self-pay | Admitting: Nurse Practitioner

## 2024-04-15 DIAGNOSIS — Z79899 Other long term (current) drug therapy: Secondary | ICD-10-CM

## 2024-04-15 NOTE — Telephone Encounter (Signed)
 Patient is returning call. Please advise?

## 2024-04-18 MED ORDER — ROSUVASTATIN CALCIUM 20 MG PO TABS: 20.0000 mg | ORAL_TABLET | Freq: Every day | ORAL | 3 refills | Status: AC

## 2024-04-18 NOTE — Telephone Encounter (Signed)
 Patient is calling back for update. Please advise

## 2024-05-16 DIAGNOSIS — H25013 Cortical age-related cataract, bilateral: Secondary | ICD-10-CM | POA: Diagnosis not present

## 2024-05-16 DIAGNOSIS — H2513 Age-related nuclear cataract, bilateral: Secondary | ICD-10-CM | POA: Diagnosis not present

## 2024-07-11 ENCOUNTER — Other Ambulatory Visit: Payer: Self-pay | Admitting: Nurse Practitioner

## 2024-07-11 DIAGNOSIS — K7469 Other cirrhosis of liver: Secondary | ICD-10-CM

## 2024-07-26 ENCOUNTER — Ambulatory Visit
Admission: RE | Admit: 2024-07-26 | Discharge: 2024-07-26 | Disposition: A | Source: Ambulatory Visit | Attending: Nurse Practitioner

## 2024-07-26 DIAGNOSIS — K7469 Other cirrhosis of liver: Secondary | ICD-10-CM

## 2024-09-29 ENCOUNTER — Ambulatory Visit

## 2024-09-30 ENCOUNTER — Ambulatory Visit
